# Patient Record
Sex: Female | Born: 1950
Health system: Southern US, Community
[De-identification: ages and names within clinical notes are randomized; demographics above are authoritative.]

## PROBLEM LIST (undated history)

## (undated) DIAGNOSIS — Z803 Family history of malignant neoplasm of breast: Secondary | ICD-10-CM

## (undated) DIAGNOSIS — Z0000 Encounter for general adult medical examination without abnormal findings: Secondary | ICD-10-CM

## (undated) DIAGNOSIS — E785 Hyperlipidemia, unspecified: Secondary | ICD-10-CM

## (undated) DIAGNOSIS — Z87891 Personal history of nicotine dependence: Secondary | ICD-10-CM

## (undated) DIAGNOSIS — Z800 Family history of malignant neoplasm of digestive organs: Secondary | ICD-10-CM

## (undated) HISTORY — DX: Family history of malignant neoplasm of digestive organs: Z80.0

## (undated) HISTORY — DX: Hyperlipidemia, unspecified: E78.5

## (undated) HISTORY — DX: Encounter for general adult medical examination without abnormal findings: Z00.00

## (undated) HISTORY — PX: COLONOSCOPY: SHX174

## (undated) HISTORY — DX: Personal history of nicotine dependence: Z87.891

## (undated) HISTORY — DX: Family history of malignant neoplasm of breast: Z80.3

---
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
## 2004-02-28 ENCOUNTER — Ambulatory Visit: Payer: Self-pay | Admitting: Internal Medicine

## 2005-03-17 ENCOUNTER — Ambulatory Visit: Payer: Self-pay | Admitting: Internal Medicine

## 2006-03-05 ENCOUNTER — Ambulatory Visit: Payer: Self-pay | Admitting: Unknown Physician Specialty

## 2006-03-18 ENCOUNTER — Ambulatory Visit: Payer: Self-pay | Admitting: Internal Medicine

## 2007-03-22 ENCOUNTER — Ambulatory Visit: Payer: Self-pay | Admitting: Internal Medicine

## 2008-04-19 ENCOUNTER — Ambulatory Visit: Payer: Self-pay | Admitting: Internal Medicine

## 2009-04-24 ENCOUNTER — Ambulatory Visit: Payer: Self-pay | Admitting: Internal Medicine

## 2010-04-01 ENCOUNTER — Ambulatory Visit: Payer: Self-pay | Admitting: Internal Medicine

## 2010-04-29 ENCOUNTER — Ambulatory Visit: Payer: Self-pay | Admitting: Internal Medicine

## 2011-03-13 ENCOUNTER — Ambulatory Visit: Payer: Self-pay | Admitting: Unknown Physician Specialty

## 2011-03-16 LAB — PATHOLOGY REPORT

## 2011-04-07 ENCOUNTER — Other Ambulatory Visit (HOSPITAL_COMMUNITY)
Admission: RE | Admit: 2011-04-07 | Discharge: 2011-04-07 | Disposition: A | Discharge status: Home / Self Care | Payer: 59 | Source: Ambulatory Visit | Attending: Internal Medicine | Admitting: Internal Medicine

## 2011-04-07 ENCOUNTER — Ambulatory Visit (INDEPENDENT_AMBULATORY_CARE_PROVIDER_SITE_OTHER): Payer: Self-pay | Admitting: Internal Medicine

## 2011-04-07 ENCOUNTER — Encounter: Payer: Self-pay | Admitting: Internal Medicine

## 2011-04-07 DIAGNOSIS — Z1211 Encounter for screening for malignant neoplasm of colon: Secondary | ICD-10-CM

## 2011-04-07 DIAGNOSIS — Z124 Encounter for screening for malignant neoplasm of cervix: Secondary | ICD-10-CM

## 2011-04-07 DIAGNOSIS — Z23 Encounter for immunization: Secondary | ICD-10-CM

## 2011-04-07 DIAGNOSIS — Z1239 Encounter for other screening for malignant neoplasm of breast: Secondary | ICD-10-CM

## 2011-04-07 DIAGNOSIS — Z7189 Other specified counseling: Secondary | ICD-10-CM

## 2011-04-07 DIAGNOSIS — Z01419 Encounter for gynecological examination (general) (routine) without abnormal findings: Secondary | ICD-10-CM | POA: Insufficient documentation

## 2011-04-07 DIAGNOSIS — Z716 Tobacco abuse counseling: Secondary | ICD-10-CM | POA: Insufficient documentation

## 2011-04-07 DIAGNOSIS — H6120 Impacted cerumen, unspecified ear: Secondary | ICD-10-CM

## 2011-04-07 DIAGNOSIS — E785 Hyperlipidemia, unspecified: Secondary | ICD-10-CM

## 2011-04-07 DIAGNOSIS — F17200 Nicotine dependence, unspecified, uncomplicated: Secondary | ICD-10-CM

## 2011-04-07 DIAGNOSIS — R5383 Other fatigue: Secondary | ICD-10-CM

## 2011-04-07 DIAGNOSIS — N760 Acute vaginitis: Secondary | ICD-10-CM | POA: Insufficient documentation

## 2011-04-07 DIAGNOSIS — J320 Chronic maxillary sinusitis: Secondary | ICD-10-CM

## 2011-04-07 DIAGNOSIS — Z720 Tobacco use: Secondary | ICD-10-CM

## 2011-04-07 DIAGNOSIS — Z1159 Encounter for screening for other viral diseases: Secondary | ICD-10-CM | POA: Insufficient documentation

## 2011-04-07 LAB — CBC WITH DIFFERENTIAL/PLATELET
Basophils Absolute: 0 10*3/uL (ref 0.0–0.1)
Eosinophils Absolute: 0 10*3/uL (ref 0.0–0.7)
Lymphocytes Relative: 32.1 % (ref 12.0–46.0)
MCHC: 33.7 g/dL (ref 30.0–36.0)
Monocytes Absolute: 0.5 10*3/uL (ref 0.1–1.0)
Neutrophils Relative %: 61.9 % (ref 43.0–77.0)
Platelets: 338 10*3/uL (ref 150.0–400.0)
RBC: 4.7 Mil/uL (ref 3.87–5.11)
RDW: 14.7 % — ABNORMAL HIGH (ref 11.5–14.6)

## 2011-04-07 LAB — LIPID PANEL: VLDL: 18 mg/dL (ref 0.0–40.0)

## 2011-04-07 LAB — COMPREHENSIVE METABOLIC PANEL
ALT: 41 U/L — ABNORMAL HIGH (ref 0–35)
AST: 38 U/L — ABNORMAL HIGH (ref 0–37)
Albumin: 4.7 g/dL (ref 3.5–5.2)
BUN: 19 mg/dL (ref 6–23)
Calcium: 10.1 mg/dL (ref 8.4–10.5)
Chloride: 109 mEq/L (ref 96–112)
Potassium: 4.4 mEq/L (ref 3.5–5.1)
Sodium: 144 mEq/L (ref 135–145)
Total Protein: 7.8 g/dL (ref 6.0–8.3)

## 2011-04-07 LAB — TSH: TSH: 0.76 u[IU]/mL (ref 0.35–5.50)

## 2011-04-07 LAB — LDL CHOLESTEROL, DIRECT: Direct LDL: 153.5 mg/dL

## 2011-04-07 MED ORDER — PREDNISONE (PAK) 10 MG PO TABS: ORAL_TABLET | ORAL | Status: AC

## 2011-04-07 MED ORDER — AMOXICILLIN-POT CLAVULANATE 875-125 MG PO TABS: 1.0000 | ORAL_TABLET | Freq: Two times a day (BID) | ORAL | Status: AC

## 2011-04-07 NOTE — Progress Notes (Signed)
Subjective:    Patient ID: Kimberly Schmitt, female    DOB: 31-May-1950, 60 y.o.   MRN: [id number]  HPI Kimberly Schmitt is a 60 yo healthy white female who presets for transition of care from [facility].  exams.  Last PAP 2010.  Last mammogram Nov 2011 normal.  Last colonoscopy Nov 2012 hyperpastic polups seen so repeat due in 2017.   Review of Systems     Objective:   Physical Exam        Assessment & Plan:  Abnormal l breast exam sinsusitis chronic.  2 weesk of augmentin plus prednisone taper    Ears  : devrox return for ear cleaning  Subjective:     Kimberly Schmitt is a 60 y.o. female   and is here for a comprehensive physical exam. Her last exam was one year ago with her former PCP Aram Beecham for her annual physical The patient reports no problems.  History   Social History  . Marital Status: Married    Spouse Name: N/A    Number of Children: N/A  . Years of Education: N/A   Occupational History  . Not on file.   Social History Main Topics  . Smoking status: Current Everyday Smoker -- 0.5 packs/day    Types: Cigarettes  . Smokeless tobacco: Never Used  . Alcohol Use: No  . Drug Use: No  . Sexually Active: Not on file   Other Topics Concern  . Not on file   Social History Narrative  . No narrative on file   No health maintenance topics applied.  The following portions of the patient's history were reviewed and updated as appropriate: allergies, current medications, past family history, past medical history, past social history, past surgical history and problem list.  Review of Systems A comprehensive review of systems was negative.   Objective:    BP 118/68  Pulse 10  Temp(Src) 98.2 F (36.8 C) (Oral)  Resp 14  Ht 4' 11.5" (1.511 m)  Wt 117 lb 12 oz (53.411 kg)  BMI 23.38 kg/m2  SpO2 98% General appearance: alert, cooperative and appears stated age Head: Normocephalic, without obvious abnormality, atraumatic Ears: normal TM's and external  ear canals both ears Nose: Nares normal. Septum midline. Mucosa normal. No drainage or sinus tenderness. Throat: lips, mucosa, and tongue normal; teeth and gums normal Neck: no adenopathy, no carotid bruit, no JVD, supple, symmetrical, trachea midline and thyroid not enlarged, symmetric, no tenderness/mass/nodules Back: symmetric, no curvature. ROM normal. No CVA tenderness. Lungs: clear to auscultation bilaterally Breasts: normal appearance, no masses or tenderness Heart: regular rate and rhythm, S1, S2 normal, no murmur, click, rub or gallop Abdomen: soft, non-tender; bowel sounds normal; no masses,  no organomegaly Pelvic: cervix normal in appearance, external genitalia normal, no adnexal masses or tenderness, no cervical motion tenderness, rectovaginal septum normal, uterus normal size, shape, and consistency and vagina normal without discharge Extremities: extremities normal, atraumatic, no cyanosis or edema Pulses: 2+ and symmetric Skin: Skin color, texture, turgor normal. No rashes or lesions Lymph nodes: Cervical, supraclavicular, and axillary nodes normal. Neurologic: Alert and oriented X 3, normal strength and tone. Normal symmetric reflexes. Normal coordination and gait    Assessment:    Healthy female exam.    Sinusitis.   Plan:    Patient will be sent for mamogram PAP was done today.  Tobacco abuse was addressed and counseling given.   Fasting labs ordered   Augmentin and prednisone for persistent sinusitis symptoms.  See After Visit Summary for Counseling Recommendations

## 2011-04-07 NOTE — Patient Instructions (Signed)
I am prescribing augmentin and prednisone for your sinus symptoms .    Please use Debrox oil to soften the ear wax in both ears and return next week (make appt) for an ear cleaning  A mammogram will be ordered today

## 2011-04-07 NOTE — Assessment & Plan Note (Addendum)
She was advised of the risks inherent in tobacco use, including increasd risks of cancer, osteoporosis, atherosclerosis and emphysema and  is interested in a gradual reduction approach.   Suggested reducing use by 1 cigarette weekly , currently using 10/day

## 2011-04-08 ENCOUNTER — Encounter: Payer: Self-pay | Admitting: Internal Medicine

## 2011-04-08 DIAGNOSIS — Z124 Encounter for screening for malignant neoplasm of cervix: Secondary | ICD-10-CM | POA: Insufficient documentation

## 2011-04-08 DIAGNOSIS — H6120 Impacted cerumen, unspecified ear: Secondary | ICD-10-CM | POA: Insufficient documentation

## 2011-04-08 DIAGNOSIS — Z1211 Encounter for screening for malignant neoplasm of colon: Secondary | ICD-10-CM | POA: Insufficient documentation

## 2011-04-08 DIAGNOSIS — Z0001 Encounter for general adult medical examination with abnormal findings: Secondary | ICD-10-CM | POA: Insufficient documentation

## 2011-04-08 NOTE — Assessment & Plan Note (Signed)
No prior abnormal mammograms.  Last one Nov 2011.   Repeat ordered today.

## 2011-04-08 NOTE — Assessment & Plan Note (Signed)
Normal PAP Nov 2010.  Repeat done today

## 2011-04-15 ENCOUNTER — Encounter: Payer: Self-pay | Admitting: Internal Medicine

## 2011-04-16 ENCOUNTER — Ambulatory Visit: Payer: 59 | Admitting: *Deleted

## 2011-04-16 NOTE — Progress Notes (Signed)
  Subjective:    Patient ID: Kimberly Schmitt, female    DOB: 09-13-1950, 60 y.o.   MRN: [id number]  HPI    Review of Systems     Objective:   Physical Exam        Assessment & Plan:

## 2011-04-17 ENCOUNTER — Encounter: Payer: Self-pay | Admitting: Internal Medicine

## 2011-04-23 ENCOUNTER — Other Ambulatory Visit (INDEPENDENT_AMBULATORY_CARE_PROVIDER_SITE_OTHER): Payer: 59 | Admitting: *Deleted

## 2011-04-23 DIAGNOSIS — R7989 Other specified abnormal findings of blood chemistry: Secondary | ICD-10-CM

## 2011-04-23 LAB — HEPATIC FUNCTION PANEL
ALT: 46 U/L — ABNORMAL HIGH (ref 0–35)
AST: 46 U/L — ABNORMAL HIGH (ref 0–37)
Alkaline Phosphatase: 50 U/L (ref 39–117)
Total Bilirubin: 0.4 mg/dL (ref 0.3–1.2)

## 2011-05-04 ENCOUNTER — Ambulatory Visit: Payer: Self-pay | Admitting: Internal Medicine

## 2011-05-11 ENCOUNTER — Telehealth: Payer: Self-pay | Admitting: Internal Medicine

## 2011-05-11 NOTE — Telephone Encounter (Signed)
[phone] Pt calling to check on ultra sound she had done 05/04/11 @ [facility]

## 2011-05-19 NOTE — Telephone Encounter (Signed)
Have you seen the ultrasound that patient had done.

## 2011-05-19 NOTE — Telephone Encounter (Signed)
No. I have not seen it.

## 2011-05-21 ENCOUNTER — Other Ambulatory Visit (INDEPENDENT_AMBULATORY_CARE_PROVIDER_SITE_OTHER): Payer: 59 | Admitting: *Deleted

## 2011-05-21 DIAGNOSIS — R7989 Other specified abnormal findings of blood chemistry: Secondary | ICD-10-CM

## 2011-05-21 LAB — HEPATIC FUNCTION PANEL
ALT: 21 U/L (ref 0–35)
AST: 20 U/L (ref 0–37)
Albumin: 4.4 g/dL (ref 3.5–5.2)
Total Bilirubin: 0.4 mg/dL (ref 0.3–1.2)

## 2011-05-22 LAB — HEPATITIS PANEL, ACUTE
Hep B C IgM: NEGATIVE
Hepatitis B Surface Ag: NEGATIVE

## 2011-05-29 ENCOUNTER — Ambulatory Visit (INDEPENDENT_AMBULATORY_CARE_PROVIDER_SITE_OTHER): Payer: 59 | Admitting: Internal Medicine

## 2011-05-29 ENCOUNTER — Encounter: Payer: Self-pay | Admitting: Internal Medicine

## 2011-05-29 VITALS — BP 142/70 | HR 80 | Temp 97.9°F | Wt 120.0 lb

## 2011-05-29 DIAGNOSIS — H81399 Other peripheral vertigo, unspecified ear: Secondary | ICD-10-CM

## 2011-05-29 MED ORDER — PROMETHAZINE HCL 25 MG PO TABS: 25.0000 mg | ORAL_TABLET | Freq: Three times a day (TID) | ORAL | Status: AC | PRN

## 2011-05-29 MED ORDER — OSELTAMIVIR PHOSPHATE 75 MG PO CAPS: 75.0000 mg | ORAL_CAPSULE | Freq: Two times a day (BID) | ORAL | Status: AC

## 2011-05-29 MED ORDER — PREDNISONE (PAK) 10 MG PO TABS: ORAL_TABLET | ORAL | Status: AC

## 2011-05-29 NOTE — Patient Instructions (Signed)
Your vertigo may be the start of the fl or a viral infection.   Do not start the tamiflu unless you develop high fevers,  Intense  muscle aches,  And cough and sinus     For right now I am going to treat you for labyrynthitis (inflammation of the inner ear ) with prednisone ,  An anthistamine (Zyrtec) and an anti nausea medication  Take the prednisone taper as before  Take zytec daily at bedtime (available generically as cetirizine  10 mg )  Use the promethazine for nausea,  1/2 tablet every 6 to 8 hours.

## 2011-05-31 ENCOUNTER — Encounter: Payer: Self-pay | Admitting: Internal Medicine

## 2011-05-31 DIAGNOSIS — H81399 Other peripheral vertigo, unspecified ear: Secondary | ICD-10-CM | POA: Insufficient documentation

## 2011-05-31 NOTE — Assessment & Plan Note (Addendum)
Sudden onset, with normal neurologic exam.  Will treat for labrynthitis and give rx for Tamiflu to start if she develops fevers, myalgias.  Prednisone, zyrtec.

## 2011-05-31 NOTE — Progress Notes (Signed)
  Subjective:    Patient ID: Kimberly Schmitt, female    DOB: 05/14/1950, 61 y.o.   MRN: [id number]  HPI  Kimberly Schmitt is a healthy 61 yr old white female who presents with sudden onset of vertigo with  nausea which started today.  She denies fevers, myalgias, back pain and urinary frequency. Her husband was sick two weeks ago with a viral infection.    Past Medical History  Diagnosis Date  . Hyperlipidemia     220 per patient   Current Outpatient Prescriptions on File Prior to Visit  Medication Sig Dispense Refill  . fish oil-omega-3 fatty acids 1000 MG capsule Take 2 g by mouth daily.          Review of Systems  Constitutional: Negative for fever, chills and unexpected weight change.  HENT: Negative for hearing loss, ear pain, nosebleeds, congestion, sore throat, facial swelling, rhinorrhea, sneezing, mouth sores, trouble swallowing, neck pain, neck stiffness, voice change, postnasal drip, sinus pressure, tinnitus and ear discharge.   Eyes: Negative for pain, discharge, redness and visual disturbance.  Respiratory: Negative for cough, chest tightness, shortness of breath, wheezing and stridor.   Cardiovascular: Negative for chest pain, palpitations and leg swelling.  Gastrointestinal: Positive for nausea.  Musculoskeletal: Negative for myalgias and arthralgias.  Skin: Negative for color change and rash.  Neurological: Positive for dizziness. Negative for weakness, light-headedness and headaches.  Hematological: Negative for adenopathy.   BP 142/70  Pulse 80  Temp(Src) 97.9 F (36.6 C) (Oral)  Wt 120 lb (54.432 kg)  SpO2 99%      Objective:   Physical Exam  Constitutional: She is oriented to person, place, and time. She appears well-developed and well-nourished.  HENT:  Mouth/Throat: Oropharynx is clear and moist.  Eyes: EOM are normal. Pupils are equal, round, and reactive to light. No scleral icterus.  Neck: Normal range of motion. Neck supple. No JVD present. No thyromegaly  present.  Cardiovascular: Normal rate, regular rhythm, normal heart sounds and intact distal pulses.   Pulmonary/Chest: Effort normal and breath sounds normal.  Abdominal: Soft. Bowel sounds are normal. She exhibits no mass. There is no tenderness.  Musculoskeletal: Normal range of motion. She exhibits no edema.  Lymphadenopathy:    She has no cervical adenopathy.  Neurological: She is alert and oriented to person, place, and time. She displays normal reflexes. No cranial nerve deficit. Coordination normal.  Skin: Skin is warm and dry.  Psychiatric: She has a normal mood and affect.      Assessment & Plan:   Vertigo, peripheral Sudden onset, with normal neurologic exam.  Will treat for labrynthitis and give rx for Tamiflu to start if she develops fevers, myalgias.  Prednisone, zyrtec.     Updated Medication List Outpatient Encounter Prescriptions as of 05/29/2011  Medication Sig Dispense Refill  . fish oil-omega-3 fatty acids 1000 MG capsule Take 2 g by mouth daily.        Marland Kitchen oseltamivir (TAMIFLU) 75 MG capsule Take 1 capsule (75 mg total) by mouth 2 (two) times daily.  10 capsule  0  . predniSONE (STERAPRED UNI-PAK) 10 MG tablet 6 tablets on Day 1 , then reduce by 1 tablet daily until gone  21 tablet  0  . promethazine (PHENERGAN) 25 MG tablet Take 1 tablet (25 mg total) by mouth every 8 (eight) hours as needed for nausea.  20 tablet  0

## 2012-04-11 ENCOUNTER — Encounter: Payer: Self-pay | Admitting: Internal Medicine

## 2012-04-11 ENCOUNTER — Ambulatory Visit (INDEPENDENT_AMBULATORY_CARE_PROVIDER_SITE_OTHER): Payer: 59 | Admitting: Internal Medicine

## 2012-04-11 VITALS — BP 110/62 | HR 89 | Temp 97.7°F | Resp 12 | Ht 59.0 in | Wt 124.0 lb

## 2012-04-11 DIAGNOSIS — E785 Hyperlipidemia, unspecified: Secondary | ICD-10-CM

## 2012-04-11 DIAGNOSIS — Z1239 Encounter for other screening for malignant neoplasm of breast: Secondary | ICD-10-CM

## 2012-04-11 DIAGNOSIS — Z1211 Encounter for screening for malignant neoplasm of colon: Secondary | ICD-10-CM

## 2012-04-11 DIAGNOSIS — R5383 Other fatigue: Secondary | ICD-10-CM

## 2012-04-11 DIAGNOSIS — R350 Frequency of micturition: Secondary | ICD-10-CM

## 2012-04-11 DIAGNOSIS — R5381 Other malaise: Secondary | ICD-10-CM

## 2012-04-11 DIAGNOSIS — IMO0001 Reserved for inherently not codable concepts without codable children: Secondary | ICD-10-CM

## 2012-04-11 DIAGNOSIS — F17200 Nicotine dependence, unspecified, uncomplicated: Secondary | ICD-10-CM

## 2012-04-11 DIAGNOSIS — Z124 Encounter for screening for malignant neoplasm of cervix: Secondary | ICD-10-CM

## 2012-04-11 DIAGNOSIS — Z1331 Encounter for screening for depression: Secondary | ICD-10-CM

## 2012-04-11 DIAGNOSIS — Z716 Tobacco abuse counseling: Secondary | ICD-10-CM

## 2012-04-11 DIAGNOSIS — Z23 Encounter for immunization: Secondary | ICD-10-CM

## 2012-04-11 DIAGNOSIS — Z7189 Other specified counseling: Secondary | ICD-10-CM

## 2012-04-11 LAB — COMPLETE METABOLIC PANEL WITH GFR
ALT: 13 U/L (ref 0–35)
AST: 18 U/L (ref 0–37)
Alkaline Phosphatase: 54 U/L (ref 39–117)
Creat: 0.71 mg/dL (ref 0.50–1.10)
GFR, Est African American: >89 mL/min
Sodium: 144 mEq/L (ref 135–145)
Total Bilirubin: 0.4 mg/dL (ref 0.3–1.2)

## 2012-04-11 LAB — URINALYSIS
Hgb urine dipstick: NEGATIVE
Nitrite: NEGATIVE
Specific Gravity, Urine: <=1.005 (ref 1.000–1.030)
Total Protein, Urine: NEGATIVE
pH: 6 (ref 5.0–8.0)

## 2012-04-11 LAB — LIPID PANEL
Cholesterol: 201 mg/dL — ABNORMAL HIGH (ref 0–200)
HDL: 62.3 mg/dL (ref >39.00)
Total CHOL/HDL Ratio: 3
Triglycerides: 83 mg/dL (ref 0.0–149.0)
VLDL: 16.6 mg/dL (ref 0.0–40.0)

## 2012-04-11 LAB — CBC WITH DIFFERENTIAL/PLATELET
Eosinophils Relative: 0.6 % (ref 0.0–5.0)
HCT: 42.5 % (ref 36.0–46.0)
Lymphs Abs: 2.3 10*3/uL (ref 0.7–4.0)
Monocytes Relative: 6.4 % (ref 3.0–12.0)
Platelets: 296 10*3/uL (ref 150.0–400.0)
RBC: 4.57 Mil/uL (ref 3.87–5.11)
WBC: 8.2 10*3/uL (ref 4.5–10.5)

## 2012-04-11 LAB — LDL CHOLESTEROL, DIRECT: Direct LDL: 125.5 mg/dL

## 2012-04-11 NOTE — Assessment & Plan Note (Signed)
Normal PAP smear Nov 2012 and normal HPV>  Repeat in 3 to  5 years per current guidelines.

## 2012-04-11 NOTE — Assessment & Plan Note (Signed)
She had hyperplastic polyps on Nov 2012 colonoscopy and is due for repeat in 2017

## 2012-04-11 NOTE — Progress Notes (Signed)
Patient ID: Kimberly Schmitt, female   DOB: 08-06-1950, 61 y.o.   MRN: [id number] Pap normal last year.  Eye exam normal except for prescription change.    Still smoking 1/2 pack daily.  Would like to quit .  Not on HRT for over one year.,  No hot flashes.   Had the flu vaccine.  Subjective:     Kimberly Schmitt is a 61 y.o. female and is here for a comprehensive physical exam. The patient reports no problems.  History   Social History  . Marital Status: Married    Spouse Name: N/A    Number of Children: N/A  . Years of Education: N/A   Occupational History  . Not on file.   Social History Main Topics  . Smoking status: Current Every Day Smoker -- 0.5 packs/day    Types: Cigarettes  . Smokeless tobacco: Never Used  . Alcohol Use: No  . Drug Use: No  . Sexually Active: Not on file   Other Topics Concern  . Not on file   Social History Narrative  . No narrative on file   Health Maintenance  Topic Date Due  . Zostavax  01/23/2009  . Influenza Vaccine  01/10/2012  . Mammogram  05/03/2013  . Pap Smear  04/06/2014  . Colonoscopy  03/12/2021  . Tetanus/tdap  04/06/2021    The following portions of the patient's history were reviewed and updated as appropriate: allergies, current medications, past family history, past medical history, past social history, past surgical history and problem list.  Review of Systems A comprehensive review of systems was negative.   Objective:   BP 110/62  Pulse 89  Temp 97.7 F (36.5 C) (Oral)  Resp 12  Ht 4\' 11"  (1.499 m)  Wt 124 lb (56.246 kg)  BMI 25.04 kg/m2  SpO2 95%  General Appearance:    Alert, cooperative, no distress, appears stated age  Head:    Normocephalic, without obvious abnormality, atraumatic  Eyes:    PERRL, conjunctiva/corneas clear, EOM's intact, fundi    benign, both eyes  Ears:    Normal TM's and external ear canals, both ears  Nose:   Nares normal, septum midline, mucosa normal, no drainage    or sinus tenderness   Throat:   Lips, mucosa, and tongue normal; teeth and gums normal  Neck:   Supple, symmetrical, trachea midline, no adenopathy;    thyroid:  no enlargement/tenderness/nodules; no carotid   bruit or JVD  Back:     Symmetric, no curvature, ROM normal, no CVA tenderness  Lungs:     Clear to auscultation bilaterally, respirations unlabored  Chest Wall:    No tenderness or deformity   Heart:    Regular rate and rhythm, S1 and S2 normal, no murmur, rub   or gallop  Breast Exam:    No tenderness, masses, or nipple abnormality  Abdomen:     Soft, non-tender, bowel sounds active all four quadrants,    no masses, no organomegaly  Extremities:   Extremities normal, atraumatic, no cyanosis or edema  Pulses:   2+ and symmetric all extremities  Skin:   Skin color, texture, turgor normal, no rashes or lesions  Lymph nodes:   Cervical, supraclavicular, and axillary nodes normal  Neurologic:   CNII-XII intact, normal strength, sensation and reflexes    throughout        Assessment:   Screening for colon cancer She had hyperplastic polyps on Nov 2012 colonoscopy and is due for  repeat in 2017  Tobacco abuse counseling Spent 3 minutes discussing risk of continued tobacco abuse, including but not limited to CAD, PAD, hypertension, and CA.  She is not interested in pharmacotherapy at this time.  Screening for cervical cancer Normal PAP smear Nov 2012 and normal HPV>  Repeat in 3 to  5 years per current guidelines.   Screening for breast cancer Breast exam notable for dense breasts. Annual mammogram is due,  Scheduled at [facility].   Updated Medication List Outpatient Encounter Prescriptions as of 04/11/2012  Medication Sig Dispense Refill  . fish oil-omega-3 fatty acids 1000 MG capsule Take 2 g by mouth daily.

## 2012-04-11 NOTE — Patient Instructions (Addendum)
You had yor TDaP vaccine last year!  You received your pneumovax today  We will schedule your mammogram in January at [facility]

## 2012-04-11 NOTE — Assessment & Plan Note (Signed)
Breast exam notable for dense breasts. Annual mammogram is due,  Scheduled at [facility].

## 2012-04-11 NOTE — Assessment & Plan Note (Signed)
Spent 3 minutes discussing risk of continued tobacco abuse, including but not limited to CAD, PAD, hypertension, and CA.  She is not interested in pharmacotherapy at this time. 

## 2012-05-06 ENCOUNTER — Telehealth: Payer: Self-pay | Admitting: Internal Medicine

## 2012-05-06 NOTE — Telephone Encounter (Signed)
Update, mammogram was compared to previous mammograms and found to be unchanged. BIRADS2, recommended follow up mammogram in 1 year.

## 2012-05-06 NOTE — Telephone Encounter (Signed)
Pt husband notified 

## 2012-05-06 NOTE — Telephone Encounter (Signed)
Mammogram was incomplete.  Radiologist recommended that they obtain former studies for comparison.  Has pt been contacted about this?

## 2012-05-19 ENCOUNTER — Encounter: Payer: Self-pay | Admitting: Internal Medicine

## 2012-10-21 ENCOUNTER — Encounter: Payer: Self-pay | Admitting: Adult Health

## 2012-10-21 ENCOUNTER — Ambulatory Visit (INDEPENDENT_AMBULATORY_CARE_PROVIDER_SITE_OTHER): Payer: BC Managed Care – PPO | Admitting: Adult Health

## 2012-10-21 VITALS — BP 138/70 | HR 84 | Temp 98.2°F | Resp 12 | Wt 123.0 lb

## 2012-10-21 DIAGNOSIS — I776 Arteritis, unspecified: Secondary | ICD-10-CM

## 2012-10-21 DIAGNOSIS — R21 Rash and other nonspecific skin eruption: Secondary | ICD-10-CM

## 2012-10-21 LAB — URINALYSIS, ROUTINE W REFLEX MICROSCOPIC
Leukocytes, UA: NEGATIVE
Nitrite: NEGATIVE
Specific Gravity, Urine: 1.015 (ref 1.000–1.030)
Total Protein, Urine: NEGATIVE
pH: 6 (ref 5.0–8.0)

## 2012-10-21 LAB — BASIC METABOLIC PANEL
BUN: 10 mg/dL (ref 6–23)
Calcium: 9.9 mg/dL (ref 8.4–10.5)
GFR: 111.33 mL/min (ref >60.00)
Glucose, Bld: 75 mg/dL (ref 70–99)
Sodium: 141 mEq/L (ref 135–145)

## 2012-10-21 MED ORDER — TRIAMCINOLONE ACETONIDE 0.1 % EX LOTN: TOPICAL_LOTION | Freq: Three times a day (TID) | CUTANEOUS | Status: DC

## 2012-10-21 NOTE — Progress Notes (Signed)
  Subjective:    Patient ID: Kimberly Schmitt, female    DOB: 11/21/1950, 62 y.o.   MRN: [id number]  HPI  Patient is a pleasant 62 year old female who presents to clinic with bilateral lower extremity rash. The rash begins on her feet and moves up towards the mid calf. There is no itching. She denies pain; however, she feels "pins and needles". Patient denies using new soaps, lotions or detergents. She walks daily. Patient has no edema. She denies fever or chills, general malaise. She also denies a recent viral or bacterial infection. Overall, patient feels well.   Current Outpatient Prescriptions on File Prior to Visit  Medication Sig Dispense Refill  . fish oil-omega-3 fatty acids 1000 MG capsule Take 2 g by mouth daily.         No current facility-administered medications on file prior to visit.     Review of Systems  Constitutional: Negative for fever and chills.  Respiratory: Negative.   Cardiovascular: Negative.  Negative for leg swelling.  Musculoskeletal: Negative.   Skin: Positive for rash.       Sloughing of skin bilateral feet and mid lower extremities  Psychiatric/Behavioral: Negative.        Objective:   Physical Exam  Constitutional: She is oriented to person, place, and time. She appears well-developed and well-nourished. No distress.  Cardiovascular: Normal rate and intact distal pulses.   Pulmonary/Chest: Effort normal. No respiratory distress.  Neurological: She is alert and oriented to person, place, and time.  Skin: Skin is warm.  Sloughing of skin bilateral feet and mid lower extremity. Rash posterior aspect of bilateral middle lower extremity.  Psychiatric: She has a normal mood and affect. Her behavior is normal. Thought content normal.          Assessment & Plan:

## 2012-10-21 NOTE — Patient Instructions (Addendum)
  Please have your labs drawn prior to leaving the office.  Apply triamcinolone lotion 3-4 times a day to the affected area.  Avoid perfumed soaps and lotions for the time being.  I am referring you to dermatology for further evaluation of rash

## 2012-10-21 NOTE — Assessment & Plan Note (Signed)
Dermatitis versus vasculitis. Check labs: Sedimentation rate, CRP, ANCA, metabolic panel and urinalysis. Triamcinolone 0.1% lotion to bilateral lower extremities 3-4 times a day. Refer to dermatology.

## 2012-10-24 ENCOUNTER — Telehealth: Payer: Self-pay | Admitting: *Deleted

## 2012-10-24 LAB — ANCA SCREEN W REFLEX TITER: Atypical p-ANCA Screen: NEGATIVE

## 2012-10-24 LAB — C-REACTIVE PROTEIN: CRP: <0.5 mg/dL (ref 0.5–20.0)

## 2012-10-24 NOTE — Telephone Encounter (Signed)
Pt called requesting lab results. All were normal, anything else I need to tell her? I see referral to dermatology in process.

## 2012-10-25 NOTE — Telephone Encounter (Signed)
Reported, see result note.

## 2013-01-24 ENCOUNTER — Telehealth: Payer: Self-pay | Admitting: *Deleted

## 2013-01-24 ENCOUNTER — Encounter: Payer: Self-pay | Admitting: Adult Health

## 2013-01-24 ENCOUNTER — Ambulatory Visit (INDEPENDENT_AMBULATORY_CARE_PROVIDER_SITE_OTHER): Payer: BC Managed Care – PPO | Admitting: Adult Health

## 2013-01-24 ENCOUNTER — Ambulatory Visit: Payer: Self-pay | Admitting: Adult Health

## 2013-01-24 VITALS — BP 112/80 | HR 90 | Temp 98.2°F | Resp 12 | Ht 59.0 in | Wt 118.0 lb

## 2013-01-24 DIAGNOSIS — R1011 Right upper quadrant pain: Secondary | ICD-10-CM

## 2013-01-24 DIAGNOSIS — R112 Nausea with vomiting, unspecified: Secondary | ICD-10-CM

## 2013-01-24 LAB — CBC WITH DIFFERENTIAL/PLATELET
Basophils Absolute: 0.1 10*3/uL (ref 0.0–0.1)
Eosinophils Absolute: 0.1 10*3/uL (ref 0.0–0.7)
HCT: 42.3 % (ref 36.0–46.0)
Hemoglobin: 14.3 g/dL (ref 12.0–15.0)
Lymphs Abs: 2.3 10*3/uL (ref 0.7–4.0)
MCHC: 33.7 g/dL (ref 30.0–36.0)
MCV: 90.8 fl (ref 78.0–100.0)
Monocytes Absolute: 0.5 10*3/uL (ref 0.1–1.0)
Monocytes Relative: 6.5 % (ref 3.0–12.0)
Neutro Abs: 5.2 10*3/uL (ref 1.4–7.7)
RDW: 14.2 % (ref 11.5–14.6)

## 2013-01-24 LAB — BASIC METABOLIC PANEL
CO2: 28 mEq/L (ref 19–32)
Calcium: 9.5 mg/dL (ref 8.4–10.5)
Sodium: 140 mEq/L (ref 135–145)

## 2013-01-24 LAB — HEPATIC FUNCTION PANEL
AST: 22 U/L (ref 0–37)
Albumin: 4.5 g/dL (ref 3.5–5.2)

## 2013-01-24 NOTE — Telephone Encounter (Signed)
Called and left message notifying pt that ultrasound of abdomen showed no evidence of gallbladder disease, ultrasound was normal.

## 2013-01-24 NOTE — Assessment & Plan Note (Addendum)
Patient is under considerable emotional stress given terminal diagnosis of her father. She is exhibiting RUQ tenderness upon palpation. Send for ultrasound to evaluate for gallstones. Check cbc, hepatic panel and bmet. Start Zantac 150 mg bid and Omeprazole 20 mg daily. Will try this for 2 weeks. If no improvement consider GI referral. Note, spend greater than 25 min in face to face communication mainly in emotional support and allowing patient to voice concerns over her father's terminal illness and in the implementation of care pertaining to her physical problem.

## 2013-01-24 NOTE — Patient Instructions (Addendum)
  Please have your labs drawn prior to leaving the office.  Once the results are available we will contact you.  Start Zantac 150 mg twice a day. Take this with breakfast and then at bedtime.  Take omeprazole (Prilosec) 20 mg first thing in the morning before drinking or eating anything else.  Make sure to eat something every 2-3 hours.  I am sending you for an abdominal ultrasound

## 2013-01-24 NOTE — Progress Notes (Signed)
  Subjective:    Patient ID: Kimberly Schmitt, female    DOB: 10/16/1950, 62 y.o.   MRN: [id number]  HPI  Patient presents with c/o stomach hurting since Sunday. She vomited fluids on Sunday evening; although, later she was able to tolerate a small meal.  She still felt nauseated yesterday and the epigastric discomfort persists. She is also having RUQ discomfort which she reports radiates to her back. Currently, she is under a lot of stress 2/2 her father's diagnosis of terminal cancer. She reports he is under hospice care and has a poor prognosis. She denies any other symptoms. She has not taken any OTC medications. She has been taking a probiotic for several months.    Review of Systems  Constitutional: Negative.   Respiratory: Negative.   Cardiovascular: Negative.   Gastrointestinal: Positive for nausea, vomiting and abdominal pain.       RUQ discomfort which radiates to her back  Genitourinary: Negative.       BP 112/80  Pulse 90  Temp(Src) 98.2 F (36.8 C) (Oral)  Resp 12  Ht 4\' 11"  (1.499 m)  Wt 118 lb (53.524 kg)  BMI 23.82 kg/m2  SpO2 98%    Objective:   Physical Exam  Constitutional: She is oriented to person, place, and time. She appears well-developed and well-nourished. No distress.  HENT:  Head: Normocephalic and atraumatic.  Cardiovascular: Normal rate, regular rhythm and normal heart sounds.  Exam reveals no gallop and no friction rub.   No murmur heard. Pulmonary/Chest: Effort normal and breath sounds normal. No respiratory distress. She has no wheezes. She has no rales.  Abdominal: Soft. Bowel sounds are normal. She exhibits no distension and no mass. There is tenderness. There is no rebound and no guarding.  Neurological: She is alert and oriented to person, place, and time. She has normal reflexes.  Skin: Skin is warm and dry.  Psychiatric: Her behavior is normal. Judgment and thought content normal.  Tearful in discussing her father's terminal illness           Assessment & Plan:

## 2013-02-03 ENCOUNTER — Encounter: Payer: Self-pay | Admitting: Adult Health

## 2013-02-03 ENCOUNTER — Ambulatory Visit: Payer: BC Managed Care – PPO | Admitting: Internal Medicine

## 2013-04-17 ENCOUNTER — Encounter: Payer: Self-pay | Admitting: Internal Medicine

## 2013-04-17 ENCOUNTER — Ambulatory Visit (INDEPENDENT_AMBULATORY_CARE_PROVIDER_SITE_OTHER): Payer: BC Managed Care – PPO | Admitting: Internal Medicine

## 2013-04-17 ENCOUNTER — Encounter (INDEPENDENT_AMBULATORY_CARE_PROVIDER_SITE_OTHER): Payer: Self-pay

## 2013-04-17 VITALS — BP 128/74 | HR 84 | Temp 97.5°F | Resp 12 | Ht 60.25 in | Wt 114.8 lb

## 2013-04-17 DIAGNOSIS — Z716 Tobacco abuse counseling: Secondary | ICD-10-CM

## 2013-04-17 DIAGNOSIS — E785 Hyperlipidemia, unspecified: Secondary | ICD-10-CM

## 2013-04-17 DIAGNOSIS — Z7189 Other specified counseling: Secondary | ICD-10-CM

## 2013-04-17 DIAGNOSIS — M899 Disorder of bone, unspecified: Secondary | ICD-10-CM

## 2013-04-17 DIAGNOSIS — M810 Age-related osteoporosis without current pathological fracture: Secondary | ICD-10-CM

## 2013-04-17 DIAGNOSIS — Z1239 Encounter for other screening for malignant neoplasm of breast: Secondary | ICD-10-CM

## 2013-04-17 DIAGNOSIS — M8580 Other specified disorders of bone density and structure, unspecified site: Secondary | ICD-10-CM

## 2013-04-17 DIAGNOSIS — Z0000 Encounter for general adult medical examination without abnormal findings: Secondary | ICD-10-CM

## 2013-04-17 DIAGNOSIS — E559 Vitamin D deficiency, unspecified: Secondary | ICD-10-CM

## 2013-04-17 DIAGNOSIS — R5381 Other malaise: Secondary | ICD-10-CM

## 2013-04-17 DIAGNOSIS — F17200 Nicotine dependence, unspecified, uncomplicated: Secondary | ICD-10-CM

## 2013-04-17 HISTORY — DX: Encounter for general adult medical examination without abnormal findings: Z00.00

## 2013-04-17 LAB — COMPREHENSIVE METABOLIC PANEL
ALT: 18 U/L (ref 0–35)
AST: 21 U/L (ref 0–37)
Albumin: 4.8 g/dL (ref 3.5–5.2)
BUN: 13 mg/dL (ref 6–23)
Calcium: 10 mg/dL (ref 8.4–10.5)
Chloride: 108 mEq/L (ref 96–112)
Potassium: 5.1 mEq/L (ref 3.5–5.1)

## 2013-04-17 LAB — LIPID PANEL
Cholesterol: 225 mg/dL — ABNORMAL HIGH (ref 0–200)
Total CHOL/HDL Ratio: 4
Triglycerides: 97 mg/dL (ref 0.0–149.0)

## 2013-04-17 LAB — LDL CHOLESTEROL, DIRECT: Direct LDL: 147.5 mg/dL

## 2013-04-17 NOTE — Assessment & Plan Note (Signed)
Smoking cessation instruction/counseling given:  counseled patient on the dangers of tobacco use, advised patient to stop smoking, and reviewed strategies to maximize success 

## 2013-04-17 NOTE — Assessment & Plan Note (Signed)
Annual comprehensive exam was done including breast, pelvic without PAP smear. All screenings have been addressed .  

## 2013-04-17 NOTE — Progress Notes (Signed)
Pre-visit discussion using our clinic review tool. No additional management support is needed unless otherwise documented below in the visit note.  

## 2013-04-17 NOTE — Progress Notes (Signed)
Patient ID: Kimberly Schmitt, female   DOB: 14-Mar-1951, 62 y.o.   MRN: [id number]  Subjective:     Kimberly Schmitt is a 62 y.o. female and is here for a comprehensive physical exam. The patient reports no problems.  She has reduced her smoking but recently has had a death in the family so has increased her use slightly.  Some viral URI sympotms since last Tuesday without feversl mylagias and facial pain.   History   Social History  . Marital Status: Married    Spouse Name: N/A    Number of Children: N/A  . Years of Education: N/A   Occupational History  . Not on file.   Social History Main Topics  . Smoking status: Current Every Day Smoker -- 0.50 packs/day    Types: Cigarettes  . Smokeless tobacco: Never Used  . Alcohol Use: No  . Drug Use: No  . Sexual Activity: Not on file   Other Topics Concern  . Not on file   Social History Narrative  . No narrative on file   Health Maintenance  Topic Date Due  . Zostavax  01/23/2009  . Influenza Vaccine  12/09/2012  . Mammogram  05/06/2014  . Pap Smear  04/12/2015  . Colonoscopy  03/12/2021  . Tetanus/tdap  04/06/2021    The following portions of the patient's history were reviewed and updated as appropriate: allergies, current medications, past family history, past medical history, past social history, past surgical history and problem list.  Review of Systems A comprehensive review of systems was negative.   Objective:  \BP 128/74  Pulse 84  Temp(Src) 97.5 F (36.4 C) (Oral)  Resp 12  Ht 5' 0.25" (1.53 m)  Wt 114 lb 12 oz (52.05 kg)  BMI 22.24 kg/m2  SpO2 98%  General Appearance:    Alert, cooperative, no distress, appears stated age  Head:    Normocephalic, without obvious abnormality, atraumatic  Eyes:    PERRL, conjunctiva/corneas clear, EOM's intact, fundi    benign, both eyes  Ears:    Normal TM's and external ear canals, both ears  Nose:   Nares normal, septum midline, mucosa normal, no drainage    or sinus  tenderness  Throat:   Lips, mucosa, and tongue normal; teeth and gums normal  Neck:   Supple, symmetrical, trachea midline, no adenopathy;    thyroid:  no enlargement/tenderness/nodules; no carotid   bruit or JVD  Back:     Symmetric, no curvature, ROM normal, no CVA tenderness  Lungs:     Clear to auscultation bilaterally, respirations unlabored  Chest Wall:    No tenderness or deformity   Heart:    Regular rate and rhythm, S1 and S2 normal, no murmur, rub   or gallop  Breast Exam:    No tenderness, masses, or nipple abnormality  Abdomen:     Soft, non-tender, bowel sounds active all four quadrants,    no masses, no organomegaly  Genitalia:    Normal female without lesion, discharge or tenderness  Rectal:    Deferred  Extremities:   Extremities normal, atraumatic, no cyanosis or edema  Pulses:   2+ and symmetric all extremities  Skin:   Skin color, texture, turgor normal, no rashes or lesions  Lymph nodes:   Cervical, supraclavicular, and axillary nodes normal  Neurologic:   CNII-XII intact, normal strength, sensation and reflexes    throughout    Assessment:   Tobacco abuse counseling Smoking cessation instruction/counseling given:  counseled  patient on the dangers of tobacco use, advised patient to stop smoking, and reviewed strategies to maximize success  Routine general medical examination at a health care facility Annual comprehensive exam was done including breast, pelvic without PAP smear. All screenings have been addressed .    Updated Medication List Outpatient Encounter Prescriptions as of 04/17/2013  Medication Sig  . aspirin 81 MG tablet Take 81 mg by mouth daily.  . Probiotic Product (PROBIOTIC DAILY PO) Take by mouth.

## 2013-04-20 ENCOUNTER — Telehealth: Payer: Self-pay | Admitting: Internal Medicine

## 2013-04-20 NOTE — Telephone Encounter (Signed)
Returned call see lab note

## 2013-04-20 NOTE — Telephone Encounter (Signed)
The patient wants you to call her back at work . [phone] ext.[phone]

## 2013-04-21 DIAGNOSIS — E785 Hyperlipidemia, unspecified: Secondary | ICD-10-CM | POA: Insufficient documentation

## 2013-04-21 MED ORDER — RED YEAST RICE EXTRACT 600 MG PO CAPS: 1.0000 | ORAL_CAPSULE | Freq: Two times a day (BID) | ORAL | Status: DC

## 2013-04-21 NOTE — Addendum Note (Signed)
Addended by: Sherlene Shams on: 04/21/2013 12:08 PM   Modules accepted: Orders

## 2013-05-09 ENCOUNTER — Ambulatory Visit: Payer: Self-pay | Admitting: Internal Medicine

## 2013-05-23 LAB — HM DEXA SCAN

## 2013-05-30 ENCOUNTER — Other Ambulatory Visit: Payer: Self-pay | Admitting: Internal Medicine

## 2013-05-30 DIAGNOSIS — M8580 Other specified disorders of bone density and structure, unspecified site: Secondary | ICD-10-CM

## 2013-06-06 ENCOUNTER — Encounter: Payer: Self-pay | Admitting: Internal Medicine

## 2014-04-27 ENCOUNTER — Encounter: Payer: Self-pay | Admitting: Internal Medicine

## 2014-04-27 ENCOUNTER — Ambulatory Visit (INDEPENDENT_AMBULATORY_CARE_PROVIDER_SITE_OTHER): Payer: BC Managed Care – PPO | Admitting: Internal Medicine

## 2014-04-27 VITALS — BP 106/74 | HR 81 | Temp 97.9°F | Wt 115.0 lb

## 2014-04-27 DIAGNOSIS — M79632 Pain in left forearm: Secondary | ICD-10-CM

## 2014-04-27 MED ORDER — PREDNISONE 10 MG PO TABS: ORAL_TABLET | ORAL | Status: DC

## 2014-04-27 NOTE — Progress Notes (Signed)
Subjective:    Patient ID: Kimberly Schmitt, female    DOB: 09-11-1950, 63 y.o.   MRN: [id number]  HPI  Pt presents to the clinic today with c/o left wrist pain. She reports this started 1 week ago. She woke up with the pain. She denies any injury to the area. The pain radiates up her arm. She reports numbness and tingling in her left hand. She has not noticed any swelling. She has taken Tylenol and leftover Norco with some relief. She is left hand dominate. She does type on a computer daily. She does not sleep on her hands.  Review of Systems      Past Medical History  Diagnosis Date  . Hyperlipidemia     220 per patient    Current Outpatient Prescriptions  Medication Sig Dispense Refill  . aspirin 81 MG tablet Take 81 mg by mouth daily.    . Probiotic Product (PROBIOTIC DAILY PO) Take by mouth.    . Red Yeast Rice Extract 600 MG CAPS Take 1 capsule (600 mg total) by mouth 2 (two) times daily. 180 capsule 0   No current facility-administered medications for this visit.    No Known Allergies  Family History  Problem Relation Age of Onset  . Cancer Father 51    colon or  lung CA  . Diabetes Sister   . Cancer Brother 70    Pancreatic and Liver cancer  . Stroke Maternal Aunt   . Cancer Maternal Grandmother     History   Social History  . Marital Status: Married    Spouse Name: N/A    Number of Children: N/A  . Years of Education: N/A   Occupational History  . Not on file.   Social History Main Topics  . Smoking status: Current Every Day Smoker -- 0.50 packs/day    Types: Cigarettes  . Smokeless tobacco: Never Used  . Alcohol Use: No  . Drug Use: No  . Sexual Activity: Not on file   Other Topics Concern  . Not on file   Social History Narrative     Constitutional: Denies fever, malaise, fatigue, headache or abrupt weight changes.  Musculoskeletal: Pt reports left wrist pain. Denies decrease in range of motion, difficulty with gait, muscle pain or joint  swelling.  Skin: Denies redness, rashes, lesions or ulcercations.  Neurological: Pt reports numbness and tingling in hand. Denies difficulty with coordination.   No other specific complaints in a complete review of systems (except as listed in HPI above).  Objective:   Physical Exam   BP 106/74 mmHg  Pulse 81  Temp(Src) 97.9 F (36.6 C) (Oral)  Wt 115 lb (52.164 kg)  SpO2 98% Wt Readings from Last 3 Encounters:  04/27/14 115 lb (52.164 kg)  04/17/13 114 lb 12 oz (52.05 kg)  01/24/13 118 lb (53.524 kg)    General: Appears her stated age,  in NAD. Skin: Warm, dry and intact. No rashes, lesions or ulcerations noted. Cardiovascular: Normal rate and rhythm. S1,S2 noted.  No murmur, rubs or gallops noted. Pulmonary/Chest: Normal effort and positive vesicular breath sounds. No respiratory distress. No wheezes, rales or ronchi noted.  Musculoskeletal: Normal flexion, extension and rotation of the left wrist. No swelling noted. Pain with palpation of the posterior forearm. Hand grips 5/5 and equal bilaterally. Neurological: Alert and oriented. Sensation intact to bilateral hands. Coordination normal. Negative Phalen, negative Tinels.   BMET    Component Value Date/Time   NA 143 04/17/2013 0840  K 5.1 04/17/2013 0840   CL 108 04/17/2013 0840   CO2 25 04/17/2013 0840   GLUCOSE 99 04/17/2013 0840   BUN 13 04/17/2013 0840   CREATININE 0.7 04/17/2013 0840   CREATININE 0.71 04/11/2012 0914   CALCIUM 10.0 04/17/2013 0840   GFRNONAA >89 04/11/2012 0914   GFRAA >89 04/11/2012 0914    Lipid Panel     Component Value Date/Time   CHOL 225* 04/17/2013 0840   TRIG 97.0 04/17/2013 0840   HDL 60.00 04/17/2013 0840   CHOLHDL 4 04/17/2013 0840   VLDL 19.4 04/17/2013 0840    CBC    Component Value Date/Time   WBC 8.2 01/24/2013 0829   RBC 4.66 01/24/2013 0829   HGB 14.3 01/24/2013 0829   HCT 42.3 01/24/2013 0829   PLT 312.0 01/24/2013 0829   MCV 90.8 01/24/2013 0829   MCHC 33.7  01/24/2013 0829   RDW 14.2 01/24/2013 0829   LYMPHSABS 2.3 01/24/2013 0829   MONOABS 0.5 01/24/2013 0829   EOSABS 0.1 01/24/2013 0829   BASOSABS 0.1 01/24/2013 0829    Hgb A1C No results found for: HGBA1C      Assessment & Plan:   Left forearm pain:  ? Tendonitis eRx for pred taper x 6 days A heating pad may be helpful Ok to take tylenol for breakthrough pain  She will follow up with her PCP in 1 week if pain persist or worsens

## 2014-04-27 NOTE — Progress Notes (Signed)
Pre visit review using our clinic review tool, if applicable. No additional management support is needed unless otherwise documented below in the visit note. 

## 2014-04-27 NOTE — Patient Instructions (Signed)

## 2014-05-01 ENCOUNTER — Telehealth: Payer: Self-pay | Admitting: Internal Medicine

## 2014-05-01 NOTE — Telephone Encounter (Signed)
emmi mailed  °

## 2014-05-02 ENCOUNTER — Encounter: Payer: Self-pay | Admitting: Internal Medicine

## 2014-05-02 ENCOUNTER — Other Ambulatory Visit (HOSPITAL_COMMUNITY)
Admission: RE | Admit: 2014-05-02 | Discharge: 2014-05-02 | Disposition: A | Discharge status: Home / Self Care | Payer: BC Managed Care – PPO | Source: Ambulatory Visit | Attending: Internal Medicine | Admitting: Internal Medicine

## 2014-05-02 ENCOUNTER — Ambulatory Visit (INDEPENDENT_AMBULATORY_CARE_PROVIDER_SITE_OTHER): Payer: BC Managed Care – PPO | Admitting: Internal Medicine

## 2014-05-02 VITALS — BP 100/76 | HR 86 | Temp 98.5°F | Resp 14 | Ht 59.5 in | Wt 114.5 lb

## 2014-05-02 DIAGNOSIS — Z1239 Encounter for other screening for malignant neoplasm of breast: Secondary | ICD-10-CM

## 2014-05-02 DIAGNOSIS — N941 Dyspareunia: Secondary | ICD-10-CM

## 2014-05-02 DIAGNOSIS — E785 Hyperlipidemia, unspecified: Secondary | ICD-10-CM

## 2014-05-02 DIAGNOSIS — Z124 Encounter for screening for malignant neoplasm of cervix: Secondary | ICD-10-CM

## 2014-05-02 DIAGNOSIS — Z1151 Encounter for screening for human papillomavirus (HPV): Secondary | ICD-10-CM | POA: Diagnosis present

## 2014-05-02 DIAGNOSIS — Z716 Tobacco abuse counseling: Secondary | ICD-10-CM

## 2014-05-02 DIAGNOSIS — IMO0002 Reserved for concepts with insufficient information to code with codable children: Secondary | ICD-10-CM

## 2014-05-02 DIAGNOSIS — E559 Vitamin D deficiency, unspecified: Secondary | ICD-10-CM

## 2014-05-02 DIAGNOSIS — Z01419 Encounter for gynecological examination (general) (routine) without abnormal findings: Secondary | ICD-10-CM | POA: Diagnosis not present

## 2014-05-02 DIAGNOSIS — M8580 Other specified disorders of bone density and structure, unspecified site: Secondary | ICD-10-CM

## 2014-05-02 DIAGNOSIS — R5383 Other fatigue: Secondary | ICD-10-CM

## 2014-05-02 DIAGNOSIS — Z23 Encounter for immunization: Secondary | ICD-10-CM

## 2014-05-02 DIAGNOSIS — Z0000 Encounter for general adult medical examination without abnormal findings: Secondary | ICD-10-CM

## 2014-05-02 LAB — COMPREHENSIVE METABOLIC PANEL
ALT: 28 U/L (ref 0–35)
AST: 23 U/L (ref 0–37)
Albumin: 4.8 g/dL (ref 3.5–5.2)
Alkaline Phosphatase: 51 U/L (ref 39–117)
BILIRUBIN TOTAL: 0.5 mg/dL (ref 0.2–1.2)
BUN: 13 mg/dL (ref 6–23)
CO2: 24 mEq/L (ref 19–32)
Calcium: 9.8 mg/dL (ref 8.4–10.5)
Chloride: 106 mEq/L (ref 96–112)
Creatinine, Ser: 0.7 mg/dL (ref 0.4–1.2)
GFR: 86.33 mL/min (ref >60.00)
GLUCOSE: 78 mg/dL (ref 70–99)
Potassium: 4.5 mEq/L (ref 3.5–5.1)
Sodium: 141 mEq/L (ref 135–145)
Total Protein: 7.6 g/dL (ref 6.0–8.3)

## 2014-05-02 LAB — LIPID PANEL
CHOLESTEROL: 238 mg/dL — AB (ref 0–200)
HDL: 72.5 mg/dL (ref >39.00)
LDL Cholesterol: 150 mg/dL — ABNORMAL HIGH (ref 0–99)
NonHDL: 165.5
TRIGLYCERIDES: 80 mg/dL (ref 0.0–149.0)
Total CHOL/HDL Ratio: 3
VLDL: 16 mg/dL (ref 0.0–40.0)

## 2014-05-02 LAB — CBC WITH DIFFERENTIAL/PLATELET
Basophils Absolute: 0 10*3/uL (ref 0.0–0.1)
Basophils Relative: 0.3 % (ref 0.0–3.0)
Eosinophils Absolute: 0 10*3/uL (ref 0.0–0.7)
Eosinophils Relative: 0.1 % (ref 0.0–5.0)
HEMATOCRIT: 43.5 % (ref 36.0–46.0)
HEMOGLOBIN: 14.4 g/dL (ref 12.0–15.0)
Lymphocytes Relative: 26.9 % (ref 12.0–46.0)
Lymphs Abs: 3.4 10*3/uL (ref 0.7–4.0)
MCHC: 33.1 g/dL (ref 30.0–36.0)
MCV: 92.3 fl (ref 78.0–100.0)
MONO ABS: 0.7 10*3/uL (ref 0.1–1.0)
Monocytes Relative: 5.5 % (ref 3.0–12.0)
NEUTROS ABS: 8.5 10*3/uL — AB (ref 1.4–7.7)
Neutrophils Relative %: 67.2 % (ref 43.0–77.0)
PLATELETS: 367 10*3/uL (ref 150.0–400.0)
RBC: 4.71 Mil/uL (ref 3.87–5.11)
RDW: 14.4 % (ref 11.5–15.5)
WBC: 12.7 10*3/uL — ABNORMAL HIGH (ref 4.0–10.5)

## 2014-05-02 LAB — TSH: TSH: 2.25 u[IU]/mL (ref 0.35–4.50)

## 2014-05-02 LAB — VITAMIN D 25 HYDROXY (VIT D DEFICIENCY, FRACTURES): VITD: 34.55 ng/mL (ref 30.00–100.00)

## 2014-05-02 MED ORDER — MELOXICAM 15 MG PO TABS: 15.0000 mg | ORAL_TABLET | Freq: Every day | ORAL | Status: DC

## 2014-05-02 NOTE — Progress Notes (Addendum)
Patient ID: [name], female   DOB: 1951-01-29, 63 y.o.   MRN: [id number]   Subjective:     Kimberly Schmitt is a 63 y.o. female and is here for a comprehensive physical exam. The patient reports several minor acute issues .  She was treated for left arm pain resumed to be secondary to tendonitis with a prednisone taper by [facility]mith last Friday.    Having some discomfort with penetration during intercourse,  Doesn't have intercourse very often . Denies bleeding and discharge.   She continues to smoke but follows a healthy diet, exercises regularly, and takes a baby aspirin daily   History   Social History  . Marital Status: Married    Spouse Name: N/A    Number of Children: N/A  . Years of Education: N/A   Occupational History  . Not on file.   Social History Main Topics  . Smoking status: Current Every Day Smoker -- 0.50 packs/day    Types: Cigarettes  . Smokeless tobacco: Never Used  . Alcohol Use: No  . Drug Use: No  . Sexual Activity: Not on file   Other Topics Concern  . Not on file   Social History Narrative   Health Maintenance  Topic Date Due  . ZOSTAVAX  01/23/2009  . INFLUENZA VACCINE  12/10/2014  . MAMMOGRAM  05/10/2015  . COLONOSCOPY  03/12/2021  . TETANUS/TDAP  04/06/2021  . DEXA SCAN  Completed  . PNEUMOCOCCAL POLYSACCHARIDE VACCINE AGE 36 AND OVER  Completed    The following portions of the patient's history were reviewed and updated as appropriate: allergies, current medications, past family history, past medical history, past social history, past surgical history and problem list.  Review of Systems A comprehensive review of systems was negative.  Patient denies headache, fevers, malaise, unintentional weight loss, skin rash, eye pain, sinus congestion and sinus pain, sore throat, dysphagia,  hemoptysis , cough, dyspnea, wheezing, chest pain, palpitations, orthopnea, edema, abdominal pain, nausea, melena, diarrhea, constipation, flank pain,  dysuria, hematuria, urinary  Frequency, nocturia, numbness, tingling, seizures,  Focal weakness, Loss of consciousness,  Tremor, insomnia, depression, anxiety, and suicidal ideation.      Objective:  BP 100/76 mmHg  Pulse 86  Temp(Src) 98.5 F (36.9 C) (Oral)  Resp 14  Ht 4' 11.5" (1.511 m)  Wt 114 lb 8 oz (51.937 kg)  BMI 22.75 kg/m2  SpO2 98% General Appearance:    Alert, cooperative, no distress, appears stated age  Head:    Normocephalic, without obvious abnormality, atraumatic  Eyes:    PERRL, conjunctiva/corneas clear, EOM's intact, fundi    benign, both eyes  Ears:    Normal TM's and external ear canals, both ears  Nose:   Nares normal, septum midline, mucosa normal, no drainage    or sinus tenderness  Throat:   Lips, mucosa, and tongue normal; teeth and gums normal  Neck:   Supple, symmetrical, trachea midline, no adenopathy;    thyroid:  no enlargement/tenderness/nodules; no carotid   bruit or JVD  Back:     Symmetric, no curvature, ROM normal, no CVA tenderness  Lungs:     Clear to auscultation bilaterally, respirations unlabored  Chest Wall:    No tenderness or deformity   Heart:    Regular rate and rhythm, S1 and S2 normal, no murmur, rub   or gallop  Breast Exam:    No tenderness, masses, or nipple abnormality  Abdomen:     Soft, non-tender, bowel sounds  active all four quadrants,    no masses, no organomegaly  Genitalia:    Pelvic: cervix normal in appearance, external genitalia normal, no adnexal masses or tenderness, no cervical motion tenderness, rectovaginal septum normal, uterus normal size, shape, and consistency and vagina normal without discharge  Extremities:   Extremities normal, atraumatic, no cyanosis or edema  Pulses:   2+ and symmetric all extremities  Skin:   Skin color, texture, turgor normal, no rashes or lesions  Lymph nodes:   Cervical, supraclavicular, and axillary nodes normal  Neurologic:   CNII-XII intact, normal strength, sensation and  reflexes    throughout     Assessment and Plan:   Problem List Items Addressed This Visit    Dyspareunia    Secondary to narrow vaginal vault and disuse.  HRt c/i secondary to ongoing tobacco abuse  .  OTC lubricants and vaginal dilators discussed  Briefly,     Hyperlipidemia    Statin was recommended at previous screening and deferred by patient . Based on current lipid profile, the risk of clinically significant CAD is 7% over the next 10 years, using the Framingham risk calculator. Continue daily baby aspirin therapy and encouragement to quit smoking   Lab Results  Component Value Date   CHOL 238* 05/02/2014   HDL 72.50 05/02/2014   LDLCALC 150* 05/02/2014   LDLDIRECT 147.5 04/17/2013   TRIG 80.0 05/02/2014   CHOLHDL 3 05/02/2014         Relevant Orders      Lipid panel (Completed)   Osteopenia    She has had a SS decrease in bone mass since 2011,  And T score of hip is now -2.4 .  Will recommend alendronate. Discussed the current controversies surrounding the risks and benefits of calcium supplementation.  Encouraged her to increase dietary calcium through natural foods including almond/coconut milk    Routine general medical examination at a health care facility    Annual wellness,  comprehensive GYN and physical exam and management of acute and chronic conditions .  During the course of the visit the patient was educated and counseled about appropriate screening and preventive services including :  diabetes screening, lipid analysis with projected  10 year  risk for CAD , nutrition counseling, colorectal cancer screening, and recommended immunizations.  Printed recommendations for health maintenance screenings was given.     Screening for cervical cancer    PAP smear was normal today    Tobacco abuse counseling    Smoking cessation instruction/counseling given:  counseled patient on the dangers of tobacco use, advised patient to stop smoking, and reviewed strategies to  maximize success     Other Visit Diagnoses    Cervical cancer screening    -  Primary    Relevant Orders       Cytology - PAP (Completed)    Vitamin D deficiency        Relevant Orders       Vit D  25 hydroxy (rtn osteoporosis monitoring) (Completed)    Other fatigue        Relevant Orders       TSH (Completed)       Comprehensive metabolic panel (Completed)       CBC with Differential (Completed)    Breast cancer screening        Relevant Orders       MM DIGITAL SCREENING BILATERAL    Need for vaccination with 13-polyvalent pneumococcal conjugate vaccine  Relevant Orders       Pneumococcal conjugate vaccine 13-valent (Completed)     '

## 2014-05-02 NOTE — Progress Notes (Signed)
Pre visit review using our clinic review tool, if applicable. No additional management support is needed unless otherwise documented below in the visit note. 

## 2014-05-02 NOTE — Patient Instructions (Addendum)
You  Have  Tendonitis of the forearm.  Finish the prednisone   And then start meloxicma once daily as your anti inflammatory,  No aleve or motrin ,  Ok to add tylenol or tramadol  Rest the forearm as much as possible over the next week,  No stirring or kneading or SCREW DRIVING !!  We discussed trying [place]'s personal vaginal lubricant in suppository form,  Or Astroglide (both are available OTC).  We also discussed gradually dilating your vaginal vault at home using vaginal dilators Quita Skye and Eve probably carries them, along with a website called [email or web address])  Dyspareunia Dyspareunia is pain during sexual intercourse. It is most common in women, but it also happens in men.  CAUSES  Female The pain from this condition is usually felt when anything is put into the vagina, but any part of the genitals may cause pain during sex. Even sitting or wearing pants can cause pain. Sometimes, a cause cannot be found. Some causes of pain during intercourse are:  Infections of the skin around the vagina.  Vaginal infections, such as a yeast, bacterial, or viral infection.  Vaginismus. This is the inability to have anything put in the vagina even when the woman wants it to happen. There is an automatic muscle contraction and pain. The pain of the muscle contraction can be so severe that intercourse is impossible.  Allergic reaction from spermicides, semen, condoms, scented tampons, soaps, douches, and vaginal sprays.  A fluid-filled sac (cyst) on the Bartholin or Skene glands, located at the opening of the vagina.  Scar tissue in the vagina from a surgically enlarged opening (episiotomy) or tearing after delivering a baby.  Vaginal dryness. This is more common in menopause. The normal secretions of the vagina are decreased. Changes in estrogen levels and increased difficulty becoming aroused can cause painful sex. Vaginal dryness can also happen when taking birth control pills.  Thinning of the tissue  (atrophy) of the vulva and vagina. This makes the area thinner, smaller, unable to stretch to accommodate a penis, and prone to infection and tearing.  Vulvar vestibulitis or vestibulodynia.This is a condition that causes pain involving the area around the entrance to the vagina.The most common cause in young women is birth control pills.Women with low estrogen levels (postmenopausal women) may also experience this.Other causes include allergic reactions, too many nerve endings, skin conditions, and pelvic muscles that cannot relax.  Vulvar dermatoses. This includes skin conditions such as lichen sclerosus and lichen planus.  Lack of foreplay to lubricate the vagina. This can cause vaginal dryness.  Noncancerous tumors (fibroids) in the uterus.  Uterus lining tissue growing outside the uterus (endometriosis).  Pregnancy that starts in the fallopian tube (tubal pregnancy).  Pregnancy or breastfeeding your baby. This can cause vaginal dryness.  A tilting or prolapse of the uterus. Prolapse is when weak and stretched muscles around the uterus allow it to fall into the vagina.  Problems with the ovaries, cysts, or scar tissue. This may be worse with certain sexual positions.  Previous surgeries causing adhesions or scar tissue in the vagina or pelvis.  Bladder and intestinal problems.  Psychological problems (such as depression or anxiety). This may make pain worse.  Negative attitudes about sex, experiencing rape, sexual assault, and misinformation about sex. These issues are often related to some types of pain.  Previous pelvic infection, causing scar tissue in the pelvis and on the female organs.  Cyst or tumor on the ovary.  Cancer of the female organs.  Certain medicines.  Medical problems such as diabetes, arthritis, or thyroid disease. Female In men, there are many physical causes of sexual discomfort. Some causes of pain during intercourse are:  Infections of the  prostate, bladder, or seminal vesicles. This can cause pain after ejaculation.  An inflamed bladder (interstitial cystitis). This may cause pain from ejaculation.  Gonorrheal infections. This may cause pain during ejaculation.  An inflamed urethra (urethritis) or inflamed prostate (prostatitis). This can make genital stimulation painful or uncomfortable.  Deformities of the penis, such as Peyronie's disease.  A tight foreskin.  Cancer of the female organs.  Psychological problems. This may make pain worse. DIAGNOSIS   Your caregiver will take a history and have you describe where the pain is located (outside the vagina, in the vagina, in the pelvis). You may be asked when you experience pain, such as with penetration or with thrusting.  Following this, your caregiver will do a physical exam. Let your caregiver know if the exam is too painful.  During the final part of the female exam, your caregiver will feel your uterus and ovaries with one hand on the abdomen and one finger in your vagina. This is a pelvic exam.  Blood tests, a Pap test, cultures for infection, an ultrasound test, and X-rays may be done. You may need to see a specialist for female problems (gynecologist).  Your caregiver may do a CT scan, MRI, or laparoscopy. Laparoscopy is a procedure to look into the pelvis with a lighted tube, through a cut (incision) in the abdomen. TREATMENT  Your caregiver can help you determine the best course of treatment. Sometimes, more testing is done. Continue with the suggested testing until your caregiver feels sure about your diagnosis and how to treat it. Sometimes, it is difficult to find the reason for the pain. The search for the cause and treatment can be frustrating. Treatment often takes several weeks to a few months before you notice any improvement. You may also need to avoid sexual activity until symptoms improve.Continuing to have sex when it hurts can delay healing and actually  make the problem worse. The treatment depends on the cause of the pain. Treatment may include:  Medicines such as antibiotics, vaginal or skin creams, hormones, or antidepressants.  Minor or major surgery.  Psychological counseling or group therapy.  Kegel exercises and vaginal dilators to help certain cases of vaginismus (spasms). Do this only if recommended by your caregiver.Kegel exercises can make some problems worse.  Applying lubrication as recommended by your caregiver if you have dryness.  Sex therapy for you and your sex partner. It is common for the pain to continue after the reason for the pain has been treated. Some reasons for this include a conditioned response. This means the person having the pain becomes so familiar with the pain that the pain continues as a response, even though the cause is removed. Sex therapy can help with this problem. HOME CARE INSTRUCTIONS   Follow your caregiver's instructions about taking medicines, tests, counseling, and follow-up treatment.  Do not use scented tampons, douches, vaginal sprays, or soaps.  Use water-based lubricants for dryness. Oil lubricants can cause irritation.  Do not use spermicides or condoms that irritate you.  Openly discuss with your partner your sexual experience, your desires, foreplay, and different sexual positions for a more comfortable and enjoyable sexual relationship.  Join group sessions for therapy, if needed.  Practice safe sex at all times.  Empty your bladder before having intercourse.  Try different positions during sexual intercourse.  Take over-the-counter pain medicine recommended by your caregiver before having sexual intercourse.  Do not wear pantyhose. Knee-high and thigh-high hose are okay.  Avoid scrubbing your vulva with a washcloth. Wash the area gently and pat dry with a towel. SEEK MEDICAL CARE IF:   You develop vaginal bleeding after sexual intercourse.  You develop a lump at  the opening of your vagina, even if it is not painful.  You have abnormal vaginal discharge.  You have vaginal dryness.  You have itching or irritation of the vulva or vagina.  You develop a rash or reaction to your medicine. SEEK IMMEDIATE MEDICAL CARE IF:   You develop severe abdominal pain during or shortly after sexual intercourse. You could have a ruptured ovarian cyst or ruptured tubal pregnancy.  You have a fever.  You have painful or bloody urination.  You have painful sexual intercourse, and you never had it before.  You pass out after having sexual intercourse. Document Released: 05/17/2007 Document Revised: 07/20/2011 Document Reviewed: 07/28/2010 [facility] Patient Information 2015 [place], [place]. This information is not intended to replace advice given to you by your health care provider. Make sure you discuss any questions you have with your health care provider.  Health Maintenance Adopting a healthy lifestyle and getting preventive care can go a long way to promote health and wellness. Talk with your health care provider about what schedule of regular examinations is right for you. This is a good chance for you to check in with your provider about disease prevention and staying healthy. In between checkups, there are plenty of things you can do on your own. Experts have done a lot of research about which lifestyle changes and preventive measures are most likely to keep you healthy. Ask your health care provider for more information. WEIGHT AND DIET  Eat a healthy diet  Be sure to include plenty of vegetables, fruits, low-fat dairy products, and lean protein.  Do not eat a lot of foods high in solid fats, added sugars, or salt.  Get regular exercise. This is one of the most important things you can do for your health.  Most adults should exercise for at least 150 minutes each week. The exercise should increase your heart rate and make you sweat (moderate-intensity  exercise).  Most adults should also do strengthening exercises at least twice a week. This is in addition to the moderate-intensity exercise.  Maintain a healthy weight  Body mass index (BMI) is a measurement that can be used to identify possible weight problems. It estimates body fat based on height and weight. Your health care provider can help determine your BMI and help you achieve or maintain a healthy weight.  For females 22 years of age and older:   A BMI below 18.5 is considered underweight.  A BMI of 18.5 to 24.9 is normal.  A BMI of 25 to 29.9 is considered overweight.  A BMI of 30 and above is considered obese.  Watch levels of cholesterol and blood lipids  You should start having your blood tested for lipids and cholesterol at 65 years of age, then have this test every 5 years.  You may need to have your cholesterol levels checked more often if:  Your lipid or cholesterol levels are high.  You are older than 65 years of age.  You are at high risk for heart disease.  CANCER SCREENING   Lung Cancer  Lung cancer screening is recommended for  adults 12-49 years old who are at high risk for lung cancer because of a history of smoking.  A yearly low-dose CT scan of the lungs is recommended for people who:  Currently smoke.  Have quit within the past 15 years.  Have at least a 30-pack-year history of smoking. A pack year is smoking an average of one pack of cigarettes a day for 1 year.  Yearly screening should continue until it has been 15 years since you quit.  Yearly screening should stop if you develop a health problem that would prevent you from having lung cancer treatment.  Breast Cancer  Practice breast self-awareness. This means understanding how your breasts normally appear and feel.  It also means doing regular breast self-exams. Let your health care provider know about any changes, no matter how small.  If you are in your 20s or 30s, you should  have a clinical breast exam (CBE) by a health care provider every 1-3 years as part of a regular health exam.  If you are 35 or older, have a CBE every year. Also consider having a breast X-ray (mammogram) every year.  If you have a family history of breast cancer, talk to your health care provider about genetic screening.  If you are at high risk for breast cancer, talk to your health care provider about having an MRI and a mammogram every year.  Breast cancer gene (BRCA) assessment is recommended for women who have family members with BRCA-related cancers. BRCA-related cancers include:  Breast.  Ovarian.  Tubal.  Peritoneal cancers.  Results of the assessment will determine the need for genetic counseling and BRCA1 and BRCA2 testing. Cervical Cancer Routine pelvic examinations to screen for cervical cancer are no longer recommended for nonpregnant women who are considered low risk for cancer of the pelvic organs (ovaries, uterus, and vagina) and who do not have symptoms. A pelvic examination may be necessary if you have symptoms including those associated with pelvic infections. Ask your health care provider if a screening pelvic exam is right for you.   The Pap test is the screening test for cervical cancer for women who are considered at risk.  If you had a hysterectomy for a problem that was not cancer or a condition that could lead to cancer, then you no longer need Pap tests.  If you are older than 65 years, and you have had normal Pap tests for the past 10 years, you no longer need to have Pap tests.  If you have had past treatment for cervical cancer or a condition that could lead to cancer, you need Pap tests and screening for cancer for at least 20 years after your treatment.  If you no longer get a Pap test, assess your risk factors if they change (such as having a new sexual partner). This can affect whether you should start being screened again.  Some women have medical  problems that increase their chance of getting cervical cancer. If this is the case for you, your health care provider may recommend more frequent screening and Pap tests.  The human papillomavirus (HPV) test is another test that may be used for cervical cancer screening. The HPV test looks for the virus that can cause cell changes in the cervix. The cells collected during the Pap test can be tested for HPV.  The HPV test can be used to screen women 61 years of age and older. Getting tested for HPV can extend the interval between normal Pap  tests from three to five years.  An HPV test also should be used to screen women of any age who have unclear Pap test results.  After 66 years of age, women should have HPV testing as often as Pap tests.  Colorectal Cancer  This type of cancer can be detected and often prevented.  Routine colorectal cancer screening usually begins at 65 years of age and continues through 65 years of age.  Your health care provider may recommend screening at an earlier age if you have risk factors for colon cancer.  Your health care provider may also recommend using home test kits to check for hidden blood in the stool.  A small camera at the end of a tube can be used to examine your colon directly (sigmoidoscopy or colonoscopy). This is done to check for the earliest forms of colorectal cancer.  Routine screening usually begins at age 93.  Direct examination of the colon should be repeated every 5-10 years through 65 years of age. However, you may need to be screened more often if early forms of precancerous polyps or small growths are found. Skin Cancer  Check your skin from head to toe regularly.  Tell your health care provider about any new moles or changes in moles, especially if there is a change in a mole's shape or color.  Also tell your health care provider if you have a mole that is larger than the size of a pencil eraser.  Always use sunscreen. Apply  sunscreen liberally and repeatedly throughout the day.  Protect yourself by wearing long sleeves, pants, a wide-brimmed hat, and sunglasses whenever you are outside. HEART DISEASE, DIABETES, AND HIGH BLOOD PRESSURE   Have your blood pressure checked at least every 1-2 years. High blood pressure causes heart disease and increases the risk of stroke.  If you are between 4 years and 79 years old, ask your health care provider if you should take aspirin to prevent strokes.  Have regular diabetes screenings. This involves taking a blood sample to check your fasting blood sugar level.  If you are at a normal weight and have a low risk for diabetes, have this test once every three years after 65 years of age.  If you are overweight and have a high risk for diabetes, consider being tested at a younger age or more often. PREVENTING INFECTION  Hepatitis B  If you have a higher risk for hepatitis B, you should be screened for this virus. You are considered at high risk for hepatitis B if:  You were born in a country where hepatitis B is common. Ask your health care provider which countries are considered high risk.  Your parents were born in a high-risk country, and you have not been immunized against hepatitis B (hepatitis B vaccine).  You have HIV or AIDS.  You use needles to inject street drugs.  You live with someone who has hepatitis B.  You have had sex with someone who has hepatitis B.  You get hemodialysis treatment.  You take certain medicines for conditions, including cancer, organ transplantation, and autoimmune conditions. Hepatitis C  Blood testing is recommended for:  Everyone born from [birth year] through 1967.  Anyone with known risk factors for hepatitis C. Sexually transmitted infections (STIs)  You should be screened for sexually transmitted infections (STIs) including gonorrhea and chlamydia if:  You are sexually active and are younger than 65 years of age.  You are  older than 65 years of age and  your health care provider tells you that you are at risk for this type of infection.  Your sexual activity has changed since you were last screened and you are at an increased risk for chlamydia or gonorrhea. Ask your health care provider if you are at risk.  If you do not have HIV, but are at risk, it may be recommended that you take a prescription medicine daily to prevent HIV infection. This is called pre-exposure prophylaxis (PrEP). You are considered at risk if:  You are sexually active and do not regularly use condoms or know the HIV status of your partner(s).  You take drugs by injection.  You are sexually active with a partner who has HIV. Talk with your health care provider about whether you are at high risk of being infected with HIV. If you choose to begin PrEP, you should first be tested for HIV. You should then be tested every 3 months for as long as you are taking PrEP.  PREGNANCY   If you are premenopausal and you may become pregnant, ask your health care provider about preconception counseling.  If you may become pregnant, take 400 to 800 micrograms (mcg) of folic acid every day.  If you want to prevent pregnancy, talk to your health care provider about birth control (contraception). OSTEOPOROSIS AND MENOPAUSE   Osteoporosis is a disease in which the bones lose minerals and strength with aging. This can result in serious bone fractures. Your risk for osteoporosis can be identified using a bone density scan.  If you are 17 years of age or older, or if you are at risk for osteoporosis and fractures, ask your health care provider if you should be screened.  Ask your health care provider whether you should take a calcium or vitamin D supplement to lower your risk for osteoporosis.  Menopause may have certain physical symptoms and risks.  Hormone replacement therapy may reduce some of these symptoms and risks. Talk to your health care provider  about whether hormone replacement therapy is right for you.  HOME CARE INSTRUCTIONS   Schedule regular health, dental, and eye exams.  Stay current with your immunizations.   Do not use any tobacco products including cigarettes, chewing tobacco, or electronic cigarettes.  If you are pregnant, do not drink alcohol.  If you are breastfeeding, limit how much and how often you drink alcohol.  Limit alcohol intake to no more than 1 drink per day for nonpregnant women. One drink equals 12 ounces of beer, 5 ounces of wine, or 1 ounces of hard liquor.  Do not use street drugs.  Do not share needles.  Ask your health care provider for help if you need support or information about quitting drugs.  Tell your health care provider if you often feel depressed.  Tell your health care provider if you have ever been abused or do not feel safe at home. Document Released: 11/10/2010 Document Revised: 09/11/2013 Document Reviewed: 03/29/2013 [facility] Patient Information 2015 [place], [place]. This information is not intended to replace advice given to you by your health care provider. Make sure you discuss any questions you have with your health care provider.

## 2014-05-03 LAB — CYTOLOGY - PAP

## 2014-05-04 ENCOUNTER — Encounter: Payer: Self-pay | Admitting: Internal Medicine

## 2014-05-04 DIAGNOSIS — IMO0002 Reserved for concepts with insufficient information to code with codable children: Secondary | ICD-10-CM | POA: Insufficient documentation

## 2014-05-04 DIAGNOSIS — E785 Hyperlipidemia, unspecified: Secondary | ICD-10-CM | POA: Insufficient documentation

## 2014-05-04 NOTE — Assessment & Plan Note (Signed)
PAP smear was normal today

## 2014-05-04 NOTE — Assessment & Plan Note (Signed)
Smoking cessation instruction/counseling given:  counseled patient on the dangers of tobacco use, advised patient to stop smoking, and reviewed strategies to maximize success 

## 2014-05-04 NOTE — Assessment & Plan Note (Signed)
Secondary to narrow vaginal vault and disuse.  HRt c/i secondary to ongoing tobacco abuse  .  OTC lubricants and vaginal dilators discussed  Briefly,

## 2014-05-04 NOTE — Assessment & Plan Note (Signed)
Annual wellness,  comprehensive GYN and physical exam and management of acute and chronic conditions .  During the course of the visit the patient was educated and counseled about appropriate screening and preventive services including :  diabetes screening, lipid analysis with projected  10 year  risk for CAD , nutrition counseling, colorectal cancer screening, and recommended immunizations.  Printed recommendations for health maintenance screenings was given.

## 2014-05-04 NOTE — Assessment & Plan Note (Addendum)
She has had a SS decrease in bone mass since 2011,  And T score of hip is now -2.4 .  Will recommend alendronate. Discussed the current controversies surrounding the risks and benefits of calcium supplementation.  Encouraged her to increase dietary calcium through natural foods including almond/coconut milk

## 2014-05-04 NOTE — Assessment & Plan Note (Signed)
Statin was recommended at previous screening and deferred by patient . Based on current lipid profile, the risk of clinically significant CAD is 7% over the next 10 years, using the Framingham risk calculator. Continue daily baby aspirin therapy and encouragement to quit smoking   Lab Results  Component Value Date   CHOL 238* 05/02/2014   HDL 72.50 05/02/2014   LDLCALC 150* 05/02/2014   LDLDIRECT 147.5 04/17/2013   TRIG 80.0 05/02/2014   CHOLHDL 3 05/02/2014

## 2014-05-08 ENCOUNTER — Encounter: Payer: Self-pay | Admitting: *Deleted

## 2014-05-23 ENCOUNTER — Ambulatory Visit: Payer: Self-pay | Admitting: Internal Medicine

## 2014-05-23 LAB — HM MAMMOGRAPHY: HM MAMMO: NEGATIVE

## 2015-01-17 ENCOUNTER — Telehealth: Payer: Self-pay | Admitting: Internal Medicine

## 2015-05-08 ENCOUNTER — Ambulatory Visit: Payer: Self-pay | Admitting: Internal Medicine

## 2015-06-05 ENCOUNTER — Encounter: Payer: Self-pay | Admitting: Internal Medicine

## 2015-06-05 ENCOUNTER — Ambulatory Visit (INDEPENDENT_AMBULATORY_CARE_PROVIDER_SITE_OTHER): Payer: BLUE CROSS/BLUE SHIELD | Admitting: Internal Medicine

## 2015-06-05 VITALS — BP 102/70 | HR 89 | Temp 97.6°F | Resp 12 | Ht 59.25 in | Wt 115.0 lb

## 2015-06-05 DIAGNOSIS — Z0000 Encounter for general adult medical examination without abnormal findings: Secondary | ICD-10-CM

## 2015-06-05 DIAGNOSIS — Z1239 Encounter for other screening for malignant neoplasm of breast: Secondary | ICD-10-CM

## 2015-06-05 DIAGNOSIS — Z720 Tobacco use: Secondary | ICD-10-CM

## 2015-06-05 DIAGNOSIS — E785 Hyperlipidemia, unspecified: Secondary | ICD-10-CM | POA: Diagnosis not present

## 2015-06-05 DIAGNOSIS — Z716 Tobacco abuse counseling: Secondary | ICD-10-CM

## 2015-06-05 DIAGNOSIS — E559 Vitamin D deficiency, unspecified: Secondary | ICD-10-CM

## 2015-06-05 DIAGNOSIS — R5383 Other fatigue: Secondary | ICD-10-CM | POA: Diagnosis not present

## 2015-06-05 DIAGNOSIS — Z1159 Encounter for screening for other viral diseases: Secondary | ICD-10-CM

## 2015-06-05 LAB — CBC WITH DIFFERENTIAL/PLATELET
BASOS ABS: 0.1 10*3/uL (ref 0.0–0.1)
Basophils Relative: 0.6 % (ref 0.0–3.0)
EOS ABS: 0.1 10*3/uL (ref 0.0–0.7)
EOS PCT: 0.6 % (ref 0.0–5.0)
HCT: 44.6 % (ref 36.0–46.0)
Hemoglobin: 14.5 g/dL (ref 12.0–15.0)
Lymphocytes Relative: 26.3 % (ref 12.0–46.0)
Lymphs Abs: 2.5 10*3/uL (ref 0.7–4.0)
MCHC: 32.5 g/dL (ref 30.0–36.0)
MCV: 92.6 fl (ref 78.0–100.0)
MONO ABS: 0.5 10*3/uL (ref 0.1–1.0)
Monocytes Relative: 4.8 % (ref 3.0–12.0)
NEUTROS PCT: 67.7 % (ref 43.0–77.0)
Neutro Abs: 6.5 10*3/uL (ref 1.4–7.7)
Platelets: 349 10*3/uL (ref 150.0–400.0)
RBC: 4.82 Mil/uL (ref 3.87–5.11)
RDW: 14.3 % (ref 11.5–15.5)
WBC: 9.6 10*3/uL (ref 4.0–10.5)

## 2015-06-05 LAB — LIPID PANEL
Cholesterol: 212 mg/dL — ABNORMAL HIGH (ref 0–200)
HDL: 75.3 mg/dL (ref >39.00)
LDL Cholesterol: 121 mg/dL — ABNORMAL HIGH (ref 0–99)
NonHDL: 136.99
TRIGLYCERIDES: 82 mg/dL (ref 0.0–149.0)
Total CHOL/HDL Ratio: 3
VLDL: 16.4 mg/dL (ref 0.0–40.0)

## 2015-06-05 LAB — COMPREHENSIVE METABOLIC PANEL
ALT: 20 U/L (ref 0–35)
AST: 23 U/L (ref 0–37)
Albumin: 4.6 g/dL (ref 3.5–5.2)
Alkaline Phosphatase: 54 U/L (ref 39–117)
BILIRUBIN TOTAL: 0.5 mg/dL (ref 0.2–1.2)
BUN: 14 mg/dL (ref 6–23)
CALCIUM: 9.9 mg/dL (ref 8.4–10.5)
CO2: 28 mEq/L (ref 19–32)
Chloride: 105 mEq/L (ref 96–112)
Creatinine, Ser: 0.71 mg/dL (ref 0.40–1.20)
GFR: 87.44 mL/min (ref >60.00)
Glucose, Bld: 93 mg/dL (ref 70–99)
Potassium: 4.9 mEq/L (ref 3.5–5.1)
Sodium: 140 mEq/L (ref 135–145)
Total Protein: 7.3 g/dL (ref 6.0–8.3)

## 2015-06-05 LAB — TSH: TSH: 0.9 u[IU]/mL (ref 0.35–4.50)

## 2015-06-05 LAB — VITAMIN D 25 HYDROXY (VIT D DEFICIENCY, FRACTURES): VITD: 31.36 ng/mL (ref 30.00–100.00)

## 2015-06-05 NOTE — Patient Instructions (Signed)

## 2015-06-05 NOTE — Progress Notes (Signed)
Patient ID: Kimberly Schmitt, female    DOB: 1951-01-07  Age: 65 y.o. MRN: [id number]  The patient is here for annual wellness examination and management of other chronic and acute problems.   PAP normal 2015 Mammogram normal 5 yr follow colonoscopy due dec 2017  vaccines up to date  The risk factors are reflected in the social history.  The roster of all physicians providing medical care to patient - is listed in the Snapshot section of the chart.  Activities of daily living:  The patient is 100% independent in all ADLs: dressing, toileting, feeding as well as independent mobility  Home safety : The patient has smoke detectors in the home. They wear seatbelts.  There are no firearms at home. There is no violence in the home.   There is no risks for hepatitis, STDs or HIV. There is no   history of blood transfusion. They have no travel history to infectious disease endemic areas of the world.  The patient has seen their dentist in the last six month. They have seen their eye doctor in the last year. They admit to slight hearing difficulty with regard to whispered voices and some television programs.  They have deferred audiologic testing in the last year.  They do not  have excessive sun exposure. Discussed the need for sun protection: hats, long sleeves and use of sunscreen if there is significant sun exposure.   Diet: the importance of a healthy diet is discussed. They do have a healthy diet.  The benefits of regular aerobic exercise were discussed. She walks 4 times per week ,  20 minutes.   Depression screen: there are no signs or vegative symptoms of depression- irritability, change in appetite, anhedonia, sadness/tearfullness.  Cognitive assessment: the patient manages all their financial and personal affairs and is actively engaged. They could relate day,date,year and events; recalled 2/3 objects at 3 minutes; performed clock-face test normally.  The following portions of the  patient's history were reviewed and updated as appropriate: allergies, current medications, past family history, past medical history,  past surgical history, past social history  and problem list.  Visual acuity was not assessed per patient preference since she has regular follow up with her ophthalmologist. Hearing and body mass index were assessed and reviewed.   During the course of the visit the patient was educated and counseled about appropriate screening and preventive services including : fall prevention , diabetes screening, nutrition counseling, colorectal cancer screening, and recommended immunizations.    CC: The primary encounter diagnosis was Breast cancer screening. Diagnoses of Vitamin D deficiency, Other fatigue, Hyperlipidemia, Need for hepatitis C screening test, Tobacco abuse, Visit for preventive health examination, and Tobacco abuse counseling were also pertinent to this visit.  still smoking 1/2 pack  Day . Spent 3 minutes discussing risk of continued tobacco abuse, including but not limited to CAD, PAD, hypertension, and CA.  He is not interested in pharmacotherapy at this time.  Walking daily  Hips hurt  History of congeintal hip issue on the left    [name] has a past medical history of Hyperlipidemia.   She has past surgical history that includes Appendectomy (1968).   Her family history includes Cancer in her maternal grandmother; Cancer (age of onset: 30) in her brother; Cancer (age of onset: 51) in her father; Diabetes in her sister; Stroke in her maternal aunt.She reports that she has been smoking Cigarettes.  She has been smoking about 0.50 packs per day. She has  never used smokeless tobacco. She reports that she does not drink alcohol or use illicit drugs.  Outpatient Prescriptions Prior to Visit  Medication Sig Dispense Refill  . aspirin 81 MG tablet Take 81 mg by mouth daily.    . Probiotic Product (PROBIOTIC DAILY PO) Take by mouth.    . meloxicam  (MOBIC) 15 MG tablet Take 1 tablet (15 mg total) by mouth daily. (Patient not taking: Reported on 06/05/2015) 30 tablet 0   No facility-administered medications prior to visit.    Review of Systems  Patient denies headache, fevers, malaise, unintentional weight loss, skin rash, eye pain, sinus congestion and sinus pain, sore throat, dysphagia,  hemoptysis , cough, dyspnea, wheezing, chest pain, palpitations, orthopnea, edema, abdominal pain, nausea, melena, diarrhea, constipation, flank pain, dysuria, hematuria, urinary  Frequency, nocturia, numbness, tingling, seizures,  Focal weakness, Loss of consciousness,  Tremor, insomnia, depression, anxiety, and suicidal ideation.      Objective:  BP 102/70 mmHg  Pulse 89  Temp(Src) 97.6 F (36.4 C) (Oral)  Resp 12  Ht 4' 11.25" (1.505 m)  Wt 115 lb (52.164 kg)  BMI 23.03 kg/m2  SpO2 99%  Physical Exam   General appearance: alert, cooperative and appears stated age Head: Normocephalic, without obvious abnormality, atraumatic Eyes: conjunctivae/corneas clear. PERRL, EOM's intact. Fundi benign. Ears: normal TM's and external ear canals both ears Nose: Nares normal. Septum midline. Mucosa normal. No drainage or sinus tenderness. Throat: lips, mucosa, and tongue normal; teeth and gums normal Neck: no adenopathy, no carotid bruit, no JVD, supple, symmetrical, trachea midline and thyroid not enlarged, symmetric, no tenderness/mass/nodules Lungs: clear to auscultation bilaterally Breasts: normal appearance, no masses or tenderness Heart: regular rate and rhythm, S1, S2 normal, no murmur, click, rub or gallop Abdomen: soft, non-tender; bowel sounds normal; no masses,  no organomegaly Extremities: extremities normal, atraumatic, no cyanosis or edema Pulses: 2+ and symmetric Skin: Skin color, texture, turgor normal. No rashes or lesions Neurologic: Alert and oriented X 3, normal strength and tone. Normal symmetric reflexes. Normal coordination and  gait.     Assessment & Plan:   Problem List Items Addressed This Visit    Tobacco abuse counseling    Smoking cessation instruction/counseling given:  counseled patient on the dangers of tobacco use, advised patient to stop smoking, and reviewed strategies to maximize success      Visit for preventive health examination    Annual wellness,  comprehensive  physical exam and management of acute and chronic conditions .  During the course of the visit the patient was educated and counseled about appropriate screening and preventive services including :  diabetes screening, lipid analysis with projected  10 year  risk for CAD , nutrition counseling, colorectal cancer screening, and recommended immunizations.  Printed recommendations for health maintenance screenings was given.         Hyperlipidemia    Statin was recommended at previous screening and deferred by patient . Based on current lipid profile, the risk of clinically significant CAD is 7% over the next 10 years, using the Framingham risk calculator. Continue daily baby aspirin therapy and encouragement to quit smoking   Lab Results  Component Value Date   CHOL 212* 06/05/2015   HDL 75.30 06/05/2015   LDLCALC 121* 06/05/2015   LDLDIRECT 147.5 04/17/2013   TRIG 82.0 06/05/2015   CHOLHDL 3 06/05/2015             Relevant Orders   Lipid panel (Completed)   Tobacco abuse  Relevant Orders   CT CHEST LUNG CA SCREEN LOW DOSE W/O CM    Other Visit Diagnoses    Breast cancer screening    -  Primary    Relevant Orders    MM DIGITAL SCREENING BILATERAL    Vitamin D deficiency        Relevant Orders    VITAMIN D 25 Hydroxy (Vit-D Deficiency, Fractures) (Completed)    Other fatigue        Relevant Orders    Comprehensive metabolic panel (Completed)    TSH (Completed)    CBC with Differential/Platelet (Completed)    Need for hepatitis C screening test        Relevant Orders    Hepatitis C antibody (Completed)       I  have discontinued Kimberly Schmitt's meloxicam. I am also having her maintain her aspirin and Probiotic Product (PROBIOTIC DAILY PO).  No orders of the defined types were placed in this encounter.    Medications Discontinued During This Encounter  Medication Reason  . meloxicam (MOBIC) 15 MG tablet Patient Preference    Follow-up: Return in about 6 months (around 12/03/2015).   Crecencio Mc, MD

## 2015-06-05 NOTE — Progress Notes (Signed)
Pre-visit discussion using our clinic review tool. No additional management support is needed unless otherwise documented below in the visit note.  

## 2015-06-06 LAB — HEPATITIS C ANTIBODY: HCV AB: NEGATIVE

## 2015-06-09 NOTE — Assessment & Plan Note (Signed)
Annual wellness,  comprehensive  physical exam and management of acute and chronic conditions .  During the course of the visit the patient was educated and counseled about appropriate screening and preventive services including :  diabetes screening, lipid analysis with projected  10 year  risk for CAD , nutrition counseling, colorectal cancer screening, and recommended immunizations.  Printed recommendations for health maintenance screenings was given.

## 2015-06-09 NOTE — Assessment & Plan Note (Signed)
Statin was recommended at previous screening and deferred by patient . Based on current lipid profile, the risk of clinically significant CAD is 7% over the next 10 years, using the Framingham risk calculator. Continue daily baby aspirin therapy and encouragement to quit smoking   Lab Results  Component Value Date   CHOL 212* 06/05/2015   HDL 75.30 06/05/2015   LDLCALC 121* 06/05/2015   LDLDIRECT 147.5 04/17/2013   TRIG 82.0 06/05/2015   CHOLHDL 3 06/05/2015

## 2015-06-09 NOTE — Assessment & Plan Note (Signed)
Smoking cessation instruction/counseling given:  counseled patient on the dangers of tobacco use, advised patient to stop smoking, and reviewed strategies to maximize success 

## 2015-06-10 ENCOUNTER — Telehealth: Payer: Self-pay | Admitting: *Deleted

## 2015-06-10 ENCOUNTER — Encounter: Payer: Self-pay | Admitting: *Deleted

## 2015-06-10 NOTE — Telephone Encounter (Signed)
Received referral for low dose lung cancer screening CT scan from Dr. Derrel Nip . Voicemail left at phone number listed in EMR (home) for patient to call me back to facilitate scheduling scan.

## 2015-06-12 ENCOUNTER — Encounter: Payer: Self-pay | Admitting: Family Medicine

## 2015-06-12 ENCOUNTER — Other Ambulatory Visit: Payer: Self-pay | Admitting: Family Medicine

## 2015-06-12 DIAGNOSIS — Z87891 Personal history of nicotine dependence: Secondary | ICD-10-CM

## 2015-06-12 HISTORY — DX: Personal history of nicotine dependence: Z87.891

## 2015-06-14 ENCOUNTER — Inpatient Hospital Stay: Payer: BLUE CROSS/BLUE SHIELD | Attending: Family Medicine | Admitting: Family Medicine

## 2015-06-14 ENCOUNTER — Encounter: Payer: Self-pay | Admitting: Family Medicine

## 2015-06-14 ENCOUNTER — Ambulatory Visit
Admission: RE | Admit: 2015-06-14 | Discharge: 2015-06-14 | Disposition: A | Discharge status: Home / Self Care | Payer: BLUE CROSS/BLUE SHIELD | Source: Ambulatory Visit | Attending: Family Medicine | Admitting: Family Medicine

## 2015-06-14 DIAGNOSIS — F17210 Nicotine dependence, cigarettes, uncomplicated: Secondary | ICD-10-CM | POA: Diagnosis not present

## 2015-06-14 DIAGNOSIS — R911 Solitary pulmonary nodule: Secondary | ICD-10-CM | POA: Diagnosis not present

## 2015-06-14 DIAGNOSIS — Z122 Encounter for screening for malignant neoplasm of respiratory organs: Secondary | ICD-10-CM | POA: Diagnosis not present

## 2015-06-14 DIAGNOSIS — Z87891 Personal history of nicotine dependence: Secondary | ICD-10-CM | POA: Insufficient documentation

## 2015-06-14 NOTE — Progress Notes (Signed)
In accordance with CMS guidelines, patient has meet eligibility criteria including age, absence of signs or symptoms of lung cancer, the specific calculation of cigarette smoking pack-years was 33.75 years and is a current smoker.   A shared decision-making session was conducted prior to the performance of CT scan. This includes one or more decision aids, includes benefits and harms of screening, follow-up diagnostic testing, over-diagnosis, false positive rate, and total radiation exposure.  Counseling on the importance of adherence to annual lung cancer LDCT screening, impact of co-morbidities, and ability or willingness to undergo diagnosis and treatment is imperative for compliance of the program.  Counseling on the importance of continued smoking cessation for former smokers; the importance of smoking cessation for current smokers and information about tobacco cessation interventions have been given to patient including the [place] [facility], 1800 quit [place], as well as Cancer Center specific smoking cessation programs.  Written order for lung cancer screening with LDCT has been given to the patient and any and all questions have been answered to the best of my abilities.   Yearly follow up will be scheduled by Shawn Perkins, Thoracic Navigator.   

## 2015-06-17 ENCOUNTER — Telehealth: Payer: Self-pay | Admitting: *Deleted

## 2015-06-17 NOTE — Telephone Encounter (Signed)
Notified patient of LDCT lung cancer screening results of Lung Rads 2 finding with recommendation for 12 month follow up imaging. Also notified of incidental finding noted below. Patient verbalizes understanding.   IMPRESSION: 4.9 mm ground-glass nodule in the right upper lobe. Lung-RADS Category 2, benign appearance or behavior. Continue annual screening with low-dose chest CT without contrast in 12 months.  Notified patient that Kimberly Schmitt will be contacted close to the time that annual scan is due for scheduling.

## 2015-06-25 ENCOUNTER — Ambulatory Visit
Admission: RE | Admit: 2015-06-25 | Discharge: 2015-06-25 | Disposition: A | Discharge status: Home / Self Care | Payer: BLUE CROSS/BLUE SHIELD | Source: Ambulatory Visit | Attending: Internal Medicine | Admitting: Internal Medicine

## 2015-06-25 DIAGNOSIS — Z1239 Encounter for other screening for malignant neoplasm of breast: Secondary | ICD-10-CM

## 2015-06-25 DIAGNOSIS — Z1231 Encounter for screening mammogram for malignant neoplasm of breast: Secondary | ICD-10-CM | POA: Insufficient documentation

## 2015-10-04 ENCOUNTER — Emergency Department
Admission: EM | Admit: 2015-10-04 | Discharge: 2015-10-04 | Disposition: A | Discharge status: Home / Self Care | Payer: BLUE CROSS/BLUE SHIELD | Attending: Emergency Medicine | Admitting: Emergency Medicine

## 2015-10-04 ENCOUNTER — Encounter: Payer: Self-pay | Admitting: Emergency Medicine

## 2015-10-04 DIAGNOSIS — Y999 Unspecified external cause status: Secondary | ICD-10-CM | POA: Insufficient documentation

## 2015-10-04 DIAGNOSIS — E785 Hyperlipidemia, unspecified: Secondary | ICD-10-CM | POA: Diagnosis not present

## 2015-10-04 DIAGNOSIS — S60562A Insect bite (nonvenomous) of left hand, initial encounter: Secondary | ICD-10-CM | POA: Diagnosis not present

## 2015-10-04 DIAGNOSIS — Y939 Activity, unspecified: Secondary | ICD-10-CM | POA: Diagnosis not present

## 2015-10-04 DIAGNOSIS — Y929 Unspecified place or not applicable: Secondary | ICD-10-CM | POA: Insufficient documentation

## 2015-10-04 DIAGNOSIS — W57XXXA Bitten or stung by nonvenomous insect and other nonvenomous arthropods, initial encounter: Secondary | ICD-10-CM | POA: Diagnosis not present

## 2015-10-04 DIAGNOSIS — M7989 Other specified soft tissue disorders: Secondary | ICD-10-CM

## 2015-10-04 DIAGNOSIS — F17210 Nicotine dependence, cigarettes, uncomplicated: Secondary | ICD-10-CM | POA: Diagnosis not present

## 2015-10-04 DIAGNOSIS — Z7982 Long term (current) use of aspirin: Secondary | ICD-10-CM | POA: Diagnosis not present

## 2015-10-04 MED ORDER — DEXAMETHASONE SODIUM PHOSPHATE 10 MG/ML IJ SOLN
10.0000 mg | Freq: Once | INTRAMUSCULAR | Status: AC
  Administered 2015-10-04: 10 mg via INTRAMUSCULAR

## 2015-10-04 MED ORDER — PREDNISONE 10 MG PO TABS: 10.0000 mg | ORAL_TABLET | Freq: Every day | ORAL | Status: DC

## 2015-10-04 MED ORDER — CEPHALEXIN 500 MG PO CAPS: 500.0000 mg | ORAL_CAPSULE | Freq: Three times a day (TID) | ORAL | Status: AC

## 2015-10-04 MED ORDER — DEXAMETHASONE SODIUM PHOSPHATE 10 MG/ML IJ SOLN
INTRAMUSCULAR | Status: AC
  Filled 2015-10-04: qty 1

## 2015-10-04 NOTE — ED Provider Notes (Signed)
CSN: [id number]     Arrival date & time 10/04/15  2215 History   First MD Initiated Contact with Patient 10/04/15 2228     Chief Complaint  Patient presents with  . Insect Bite     (Consider location/radiation/quality/duration/timing/severity/associated sxs/prior Treatment) HPI  65 year old female presents to emergency department for evaluation of left hand swelling. Patient states she was riding her lawn more, mowing the grass when she felt a bite to the dorsal aspect of the left hand. She noticed itching and swelling. Symptoms occurred at 4 PM. Over the last couple hours she's had increased swelling and itching. She denies any significant pain or fevers. Swelling is been stable since 6 PM. She has taken Benadryl without much improvement. She denies any numbness or tingling. She is able to make a fist without pain, only mild tightness throughout the MCP joints.  Past Medical History  Diagnosis Date  . Hyperlipidemia     220 per patient  . Personal history of tobacco use, presenting hazards to health 06/12/2015   Past Surgical History  Procedure Laterality Date  . Appendectomy  1968   Family History  Problem Relation Age of Onset  . Cancer Father 29    colon or  lung CA  . Diabetes Sister   . Cancer Brother 62    Pancreatic and Liver cancer  . Stroke Maternal Aunt   . Cancer Maternal Grandmother   . Breast cancer Maternal Grandmother 85   Social History  Substance Use Topics  . Smoking status: Current Every Day Smoker -- 0.50 packs/day for 45 years    Types: Cigarettes  . Smokeless tobacco: Never Used  . Alcohol Use: No   OB History    No data available     Review of Systems  Constitutional: Negative for fever, chills, activity change and fatigue.  HENT: Negative for congestion, sinus pressure and sore throat.   Eyes: Negative for visual disturbance.  Respiratory: Negative for cough, chest tightness and shortness of breath.   Cardiovascular: Negative for chest pain and  leg swelling.  Gastrointestinal: Negative for nausea, vomiting, abdominal pain and diarrhea.  Genitourinary: Negative for dysuria.  Musculoskeletal: Negative for arthralgias and gait problem.  Skin: Positive for rash and wound.  Neurological: Negative for weakness, numbness and headaches.  Hematological: Negative for adenopathy.  Psychiatric/Behavioral: Negative for behavioral problems, confusion and agitation.      Allergies  Review of patient's allergies indicates no known allergies.  Home Medications   Prior to Admission medications   Medication Sig Start Date End Date Taking? Authorizing Provider  aspirin 81 MG tablet Take 81 mg by mouth daily.    Historical Provider, MD  cephALEXin (KEFLEX) 500 MG capsule Take 1 capsule (500 mg total) by mouth 3 (three) times daily. 7 days 10/04/15 10/14/15  Duanne Guess, PA-C  predniSONE (DELTASONE) 10 MG tablet Take 1 tablet (10 mg total) by mouth daily. 6,5,4,3,2,1 six day taper 10/04/15   Duanne Guess, PA-C  Probiotic Product (PROBIOTIC DAILY PO) Take by mouth.    Historical Provider, MD   BP 168/91 mmHg  Pulse 92  Temp(Src) 98.2 F (36.8 C) (Oral)  Resp 16  Ht 4\' 11"  (1.499 m)  Wt 52.164 kg  BMI 23.21 kg/m2  SpO2 98% Physical Exam  Constitutional: She is oriented to person, place, and time. She appears well-developed and well-nourished. No distress.  HENT:  Head: Normocephalic and atraumatic.  Mouth/Throat: Oropharynx is clear and moist.  Eyes: EOM are normal. Pupils  are equal, round, and reactive to light. Right eye exhibits no discharge. Left eye exhibits no discharge.  Neck: Normal range of motion. Neck supple.  Cardiovascular: Normal rate, regular rhythm and intact distal pulses.   Pulmonary/Chest: Effort normal and breath sounds normal. No respiratory distress.  Abdominal: Soft. She exhibits no distension. There is no tenderness.  Musculoskeletal:  Examination of the left hand shows the patient has full composite fist.  There is mild to moderate swelling of the soft tissues along the dorsum of the left hand throughout the MCP joints. There is no significant warmth or erythema. Along the third MCP joint there is mild erythema with no fluctuance. There is no skin lesion identified. No signs of any drainage. Patient is nontender to palpation. She has full range of motion of the wrist and digits.  Neurological: She is alert and oriented to person, place, and time. She has normal reflexes.  Skin: Skin is warm and dry.  Psychiatric: She has a normal mood and affect. Her behavior is normal. Thought content normal.    ED Course  Procedures (including critical care time) Labs Review Labs Reviewed - No data to display  Imaging Review No results found. I have personally reviewed and evaluated these images and lab results as part of my medical decision-making.   EKG Interpretation None      MDM   Final diagnoses:  Insect bite of left hand, initial encounter  Swelling of left hand   65 year old female with insect bite to the left hand, developed left hand swelling. Swelling consistent with inflammation. No signs of cellulitis or infection. She is given 10 mg of dexamethasone IM. She is started on a 6 day prednisone taper. She'll continue Benadryl as needed. Recommend ice and elevation. If no improvement and 24 hours, would recommend starting Keflex. Patient understands instructions for discharge. She also understands signs and symptoms to return to the emergency department for.    Duanne Guess, PA-C 10/04/15 2346  Orbie Pyo, MD 10/05/15 [id number]

## 2015-10-04 NOTE — Discharge Instructions (Signed)
Insect Bite Mosquitoes, flies, fleas, bedbugs, and many other insects can bite. Insect bites are different from insect stings. A sting is when poison (venom) is injected into the skin. Insect bites can cause pain or itching for a few days, but they are usually not serious. Some insects can spread diseases to people through a bite. SYMPTOMS  Symptoms of an insect bite include:  Itching or pain in the bite area.  Redness and swelling in the bite area.  An open wound (skin ulcer). In many cases, symptoms last for 2-4 days.  DIAGNOSIS  This condition is usually diagnosed based on symptoms and a physical exam. TREATMENT  Treatment is usually not needed for an insect bite. Symptoms often go away on their own. Your health care provider may recommend creams or lotions to help reduce itching. Antibiotic medicines may be prescribed if the bite becomes infected. A tetanus shot may be given in some cases. If you develop an allergic reaction to an insect bite, your health care provider will prescribe medicines to treat the reaction (antihistamines). This is rare. HOME CARE INSTRUCTIONS  Do not scratch the bite area.  Keep the bite area clean and dry. Wash the bite area daily with soap and water as told by your health care provider.  If directed, applyice to the bite area.  Put ice in a plastic bag.  Place a towel between your skin and the bag.  Leave the ice on for 20 minutes, 2-3 times per day.  To help reduce itching and swelling, try applying a baking soda paste, cortisone cream, or calamine lotion to the bite area as told by your health care provider.  Apply or take over-the-counter and prescription medicines only as told by your health care provider.  If you were prescribed an antibiotic medicine, use it as told by your health care provider. Do not stop using the antibiotic even if your condition improves.  Keep all follow-up visits as told by your health care provider. This is  important. PREVENTION   Use insect repellent. The best insect repellents contain:  DEET, picaridin, oil of lemon eucalyptus (OLE), or IR3535.  Higher amounts of an active ingredient.  When you are outdoors, wear clothing that covers your arms and legs.  Avoid opening windows that do not have window screens. SEEK MEDICAL CARE IF:  You have increased redness, swelling, or pain in the bite area.  You have a fever. SEEK IMMEDIATE MEDICAL CARE IF:   You have joint pain.   You have fluid, blood, or pus coming from the bite area.  You have a headache or neck pain.  You have unusual weakness.  You have a rash.  You have chest pain or shortness of breath.  You have abdominal pain, nausea, or vomiting.  You feel unusually tired or sleepy.   This information is not intended to replace advice given to you by your health care provider. Make sure you discuss any questions you have with your health care provider.   Document Released: 06/04/2004 Document Revised: 01/16/2015 Document Reviewed: 09/12/2014 Elsevier Interactive Patient Education 2016 [employer]  Edema Edema is an abnormal buildup of fluids. It is more common in your legs and thighs. Painless swelling of the feet and ankles is more likely as a person ages. It also is common in looser skin, like around your eyes. HOME CARE   Keep the affected body part above the level of the heart while lying down.  Do not sit still or stand  for a long time.  Do not put anything right under your knees when you lie down.  Do not wear tight clothes on your upper legs.  Exercise your legs to help the puffiness (swelling) go down.  Wear elastic bandages or support stockings as told by your doctor.  A low-salt diet may help lessen the puffiness.  Only take medicine as told by your doctor. GET HELP IF:  Treatment is not working.  You have heart, liver, or kidney disease and notice that your skin looks puffy or shiny.  You  have puffiness in your legs that does not get better when you raise your legs.  You have sudden weight gain for no reason. GET HELP RIGHT AWAY IF:   You have shortness of breath or chest pain.  You cannot breathe when you lie down.  You have pain, redness, or warmth in the areas that are puffy.  You have heart, liver, or kidney disease and get edema all of a sudden.  You have a fever and your symptoms get worse all of a sudden. MAKE SURE YOU:   Understand these instructions.  Will watch your condition.  Will get help right away if you are not doing well or get worse.   This information is not intended to replace advice given to you by your health care provider. Make sure you discuss any questions you have with your health care provider.   Document Released: 10/14/2007 Document Revised: 05/02/2013 Document Reviewed: 02/17/2013 Elsevier Interactive Patient Education 2016 [employer] Inc. Please take prednisone as prescribed. Continue Benadryl. If no improvement in 24 hours, start Keflex. Return to the ED for any worsening swelling, pain, fevers or for any urgent changes in her health.

## 2015-10-04 NOTE — ED Notes (Signed)
Pt comes into the ED via POV c/o insect bite to the left hand with swelling present.  Patient took 4 benadryl and cortisone cream at home with no relief.

## 2015-10-10 NOTE — Telephone Encounter (Signed)
error 

## 2016-04-24 LAB — HM COLONOSCOPY

## 2016-05-13 ENCOUNTER — Encounter: Payer: Self-pay | Admitting: Internal Medicine

## 2016-05-13 ENCOUNTER — Ambulatory Visit (INDEPENDENT_AMBULATORY_CARE_PROVIDER_SITE_OTHER): Payer: BLUE CROSS/BLUE SHIELD | Admitting: Internal Medicine

## 2016-05-13 VITALS — BP 116/66 | HR 81 | Temp 97.4°F | Resp 16 | Ht 59.5 in | Wt 111.5 lb

## 2016-05-13 DIAGNOSIS — I2510 Atherosclerotic heart disease of native coronary artery without angina pectoris: Secondary | ICD-10-CM | POA: Diagnosis not present

## 2016-05-13 DIAGNOSIS — E559 Vitamin D deficiency, unspecified: Secondary | ICD-10-CM

## 2016-05-13 DIAGNOSIS — Z79899 Other long term (current) drug therapy: Secondary | ICD-10-CM

## 2016-05-13 DIAGNOSIS — Z0000 Encounter for general adult medical examination without abnormal findings: Secondary | ICD-10-CM | POA: Diagnosis not present

## 2016-05-13 DIAGNOSIS — Z1239 Encounter for other screening for malignant neoplasm of breast: Secondary | ICD-10-CM

## 2016-05-13 DIAGNOSIS — E7800 Pure hypercholesterolemia, unspecified: Secondary | ICD-10-CM | POA: Diagnosis not present

## 2016-05-13 DIAGNOSIS — R5383 Other fatigue: Secondary | ICD-10-CM | POA: Diagnosis not present

## 2016-05-13 DIAGNOSIS — Z1231 Encounter for screening mammogram for malignant neoplasm of breast: Secondary | ICD-10-CM | POA: Diagnosis not present

## 2016-05-13 DIAGNOSIS — R911 Solitary pulmonary nodule: Secondary | ICD-10-CM | POA: Diagnosis not present

## 2016-05-13 DIAGNOSIS — Z716 Tobacco abuse counseling: Secondary | ICD-10-CM | POA: Diagnosis not present

## 2016-05-13 DIAGNOSIS — Z720 Tobacco use: Secondary | ICD-10-CM

## 2016-05-13 LAB — LIPID PANEL
CHOL/HDL RATIO: 3
Cholesterol: 245 mg/dL — ABNORMAL HIGH (ref 0–200)
HDL: 78.1 mg/dL (ref >39.00)
LDL Cholesterol: 151 mg/dL — ABNORMAL HIGH (ref 0–99)
NONHDL: 167.22
Triglycerides: 79 mg/dL (ref 0.0–149.0)
VLDL: 15.8 mg/dL (ref 0.0–40.0)

## 2016-05-13 LAB — COMPREHENSIVE METABOLIC PANEL
ALK PHOS: 54 U/L (ref 39–117)
ALT: 16 U/L (ref 0–35)
AST: 17 U/L (ref 0–37)
Albumin: 5 g/dL (ref 3.5–5.2)
BUN: 20 mg/dL (ref 6–23)
CHLORIDE: 105 meq/L (ref 96–112)
CO2: 29 mEq/L (ref 19–32)
Calcium: 10 mg/dL (ref 8.4–10.5)
Creatinine, Ser: 0.72 mg/dL (ref 0.40–1.20)
GFR: 85.8 mL/min (ref >60.00)
GLUCOSE: 87 mg/dL (ref 70–99)
POTASSIUM: 5 meq/L (ref 3.5–5.1)
SODIUM: 142 meq/L (ref 135–145)
TOTAL PROTEIN: 7 g/dL (ref 6.0–8.3)
Total Bilirubin: 0.4 mg/dL (ref 0.2–1.2)

## 2016-05-13 LAB — CBC WITH DIFFERENTIAL/PLATELET
Basophils Absolute: 0.1 10*3/uL (ref 0.0–0.1)
Basophils Relative: 0.6 % (ref 0.0–3.0)
Eosinophils Absolute: 0.1 10*3/uL (ref 0.0–0.7)
Eosinophils Relative: 0.5 % (ref 0.0–5.0)
HEMATOCRIT: 45 % (ref 36.0–46.0)
Hemoglobin: 15 g/dL (ref 12.0–15.0)
LYMPHS PCT: 22.5 % (ref 12.0–46.0)
Lymphs Abs: 2.4 10*3/uL (ref 0.7–4.0)
MCHC: 33.3 g/dL (ref 30.0–36.0)
MCV: 91.9 fl (ref 78.0–100.0)
MONOS PCT: 4.7 % (ref 3.0–12.0)
Monocytes Absolute: 0.5 10*3/uL (ref 0.1–1.0)
NEUTROS ABS: 7.6 10*3/uL (ref 1.4–7.7)
Neutrophils Relative %: 71.7 % (ref 43.0–77.0)
PLATELETS: 350 10*3/uL (ref 150.0–400.0)
RBC: 4.9 Mil/uL (ref 3.87–5.11)
RDW: 15.1 % (ref 11.5–15.5)
WBC: 10.6 10*3/uL — ABNORMAL HIGH (ref 4.0–10.5)

## 2016-05-13 LAB — VITAMIN D 25 HYDROXY (VIT D DEFICIENCY, FRACTURES): VITD: 30.34 ng/mL (ref 30.00–100.00)

## 2016-05-13 LAB — TSH: TSH: 1.13 u[IU]/mL (ref 0.35–4.50)

## 2016-05-13 MED ORDER — PRAVASTATIN SODIUM 20 MG PO TABS: 20.0000 mg | ORAL_TABLET | Freq: Every day | ORAL | 0 refills | Status: DC

## 2016-05-13 NOTE — Progress Notes (Signed)
Pre-visit discussion using our clinic review tool. No additional management support is needed unless otherwise documented below in the visit note.  

## 2016-05-13 NOTE — Progress Notes (Signed)
Patient ID: Kimberly Schmitt, female    DOB: 01-31-1951  Age: 66 y.o. MRN: [id number]  The patient is here for annual physical examination and management of other chronic and acute problems.  Mammogram feb 2017 normal  Diagnostic Colonoscopy Nov 2017 3 polyps found,   5 yr follow up per patient  Dermatology in march for history of benign skin lesion      The risk factors are reflected in the social history.  Working full time at [employer],  30 years,  Data processing manager.    The roster of all physicians providing medical care to patient - is listed in the Snapshot section of the chart.  Activities of daily living:  The patient is 100% independent in all ADLs: dressing, toileting, feeding as well as independent mobility  Home safety : The patient has smoke detectors in the home. They wear seatbelts.  There are no firearms at home. There is no violence in the home.   There is no risks for hepatitis, STDs or HIV. There is no   history of blood transfusion. They have no travel history to infectious disease endemic areas of the world.  The patient has seen their dentist in the last six month. They have seen their eye doctor in the last year. They admit to slight hearing difficulty with regard to whispered voices and some television programs.  They have deferred audiologic testing in the last year.  They do not  have excessive sun exposure. Discussed the need for sun protection: hats, long sleeves and use of sunscreen if there is significant sun exposure.   Diet: the importance of a healthy diet is discussed. They do have a healthy diet.  The benefits of regular aerobic exercise were discussed. She walks 4 times per week ,  20 minutes.   Depression screen: there are no signs or vegative symptoms of depression- irritability, change in appetite, anhedonia, sadness/tearfullness.  Cognitive assessment: the patient manages all their financial and personal affairs and is actively engaged. They could  relate day,date,year and events; recalled 2/3 objects at 3 minutes; performed clock-face test normally.  The following portions of the patient's history were reviewed and updated as appropriate: allergies, current medications, past family history, past medical history,  past surgical history, past social history  and problem list.  Visual acuity was not assessed per patient preference since she has regular follow up with her ophthalmologist. Hearing and body mass index were assessed and reviewed.   During the course of the visit the patient was educated and counseled about appropriate screening and preventive services including : fall prevention , diabetes screening, nutrition counseling, colorectal cancer screening, and recommended immunizations.    CC: The primary encounter diagnosis was Breast cancer screening. Diagnoses of Atherosclerosis of native coronary artery of native heart without angina pectoris, Vitamin D deficiency, Fatigue, unspecified type, Solitary pulmonary nodule, Pure hypercholesterolemia, Tobacco abuse, Tobacco abuse counseling, Visit for preventive health examination, and Long-term use of high-risk medication were also pertinent to this visit.  Hyperlipidemia: reviewed Fasting lipids jan 2017  LDL 121 HDL 75.  Cardiac risk factors include tobacco abuse,  CAD seen on low dose chest CT for lung ca screening ; calcification of LAD and aortic arch   Pulmonary nodule: 5 mm nodule rul benign appearing,  Seen on lung CT: repeat imaging needed in 12 months   [name] has a past medical history of Hyperlipidemia and Personal history of tobacco use, presenting hazards to health (06/12/2015).   She has  a past surgical history that includes Appendectomy (1968).   Her family history includes Breast cancer (age of onset: 57) in her maternal grandmother; Cancer in her maternal grandmother; Cancer (age of onset: 48) in her brother; Cancer (age of onset: 61) in her father; Diabetes in  her sister; Stroke in her maternal aunt.She reports that she has been smoking Cigarettes.  She has a 22.50 pack-year smoking history. She has never used smokeless tobacco. She reports that she does not drink alcohol or use drugs.  Outpatient Medications Prior to Visit  Medication Sig Dispense Refill  . aspirin 81 MG tablet Take 81 mg by mouth daily.    . Probiotic Product (PROBIOTIC DAILY PO) Take by mouth.    . predniSONE (DELTASONE) 10 MG tablet Take 1 tablet (10 mg total) by mouth daily. 6,5,4,3,2,1 six day taper 21 tablet 0   No facility-administered medications prior to visit.     Review of Systems   Patient denies headache, fevers, malaise, unintentional weight loss, skin rash, eye pain, sinus congestion and sinus pain, sore throat, dysphagia,  hemoptysis , cough, dyspnea, wheezing, chest pain, palpitations, orthopnea, edema, abdominal pain, nausea, melena, diarrhea, constipation, flank pain, dysuria, hematuria, urinary  Frequency, nocturia, numbness, tingling, seizures,  Focal weakness, Loss of consciousness,  Tremor, insomnia, depression, anxiety, and suicidal ideation.      Objective:  BP 116/66   Pulse 81   Temp 97.4 F (36.3 C) (Oral)   Resp 16   Ht 4' 11.5" (1.511 m)   Wt 111 lb 8 oz (50.6 kg)   SpO2 97%   BMI 22.14 kg/m   Physical Exam  General appearance: alert, cooperative and appears stated age Head: Normocephalic, without obvious abnormality, atraumatic Eyes: conjunctivae/corneas clear. PERRL, EOM's intact. Fundi benign. Ears: normal TM's and external ear canals both ears Nose: Nares normal. Septum midline. Mucosa normal. No drainage or sinus tenderness. Throat: lips, mucosa, and tongue normal; teeth and gums normal Neck: no adenopathy, no carotid bruit, no JVD, supple, symmetrical, trachea midline and thyroid not enlarged, symmetric, no tenderness/mass/nodules Lungs: clear to auscultation bilaterally Breasts: normal appearance, no masses or tenderness Heart:  regular rate and rhythm, S1, S2 normal, no murmur, click, rub or gallop Abdomen: soft, non-tender; bowel sounds normal; no masses,  no organomegaly Extremities: extremities normal, atraumatic, no cyanosis or edema Pulses: 2+ and symmetric Skin: Skin color, texture, turgor normal. No rashes or lesions Neurologic: Alert and oriented X 3, normal strength and tone. Normal symmetric reflexes. Normal coordination and gait.     Assessment & Plan:   Problem List Items Addressed This Visit    Atherosclerosis of native coronary artery of native heart without angina pectoris    Reviewed the significance of the findings on cardiac CT, despite a relatively normal lipid profile,  and the role that tobacco cessation and statin therapy play in stabilizing placque      Relevant Medications   pravastatin (PRAVACHOL) 20 MG tablet   Other Relevant Orders   Comprehensive metabolic panel (Completed)   Lipid panel (Completed)   Hyperlipidemia    Using the Framingham risk calculator,  her 10 year risk of coronary artery disease is11%. Strongly recommending use of pravastatin  And aspirin given presence of atherosclerosis on CT.  Tobacco cessation strongly recommended.       Relevant Medications   pravastatin (PRAVACHOL) 20 MG tablet   Solitary pulmonary nodule   Relevant Orders   CT CHEST LUNG CA SCREEN LOW DOSE W/O CM   Tobacco abuse  Smoking cessation instruction/counseling given: commended patient for reducing daily use and encouraged  Patient to continue reduction in daily use by 1 cigarette every week      Tobacco abuse counseling    Smoking cessation instruction/counseling given:  counseled patient on the dangers of tobacco use, advised patient to stop smoking, and reviewed strategies to maximize success      Visit for preventive health examination    Annual comprehensive preventive exam was done as well as an evaluation and management of chronic conditions .  During the course of the visit the  patient was educated and counseled about appropriate screening and preventive services including :  diabetes screening, lipid analysis with projected  10 year  risk for CAD , nutrition counseling, breast, cervical and colorectal cancer screening, and recommended immunizations.  Printed recommendations for health maintenance screenings was given      Vitamin D deficiency    Borderline,  Recommended in increase n supplementation to 2000 IUs daily for the winter months       Relevant Orders   VITAMIN D 25 Hydroxy (Vit-D Deficiency, Fractures) (Completed)    Other Visit Diagnoses    Breast cancer screening    -  Primary   Relevant Orders   MM SCREENING BREAST TOMO BILATERAL   Fatigue, unspecified type       Relevant Orders   TSH (Completed)   CBC with Differential/Platelet (Completed)   Long-term use of high-risk medication       Relevant Orders   Hepatic function panel      I have discontinued Ms. Traister's predniSONE. I am also having her start on pravastatin. Additionally, I am having her maintain her aspirin and Probiotic Product (PROBIOTIC DAILY PO).  Meds ordered this encounter  Medications  . pravastatin (PRAVACHOL) 20 MG tablet    Sig: Take 1 tablet (20 mg total) by mouth daily.    Dispense:  90 tablet    Refill:  0    Medications Discontinued During This Encounter  Medication Reason  . predniSONE (DELTASONE) 10 MG tablet No longer needed (for PRN medications)    Follow-up: No Follow-up on file.   Crecencio Mc, MD

## 2016-05-13 NOTE — Patient Instructions (Addendum)
I have recommended a trial of pravastatin to stabilize the placque that was seen in your coronary artery and aorta.  This placuqe increases your risk for heart attack and stroke.    Please return in 6 weeks for repeat blood tests. (fasting preferably)  Continue to take 81 mg aspirin daily as well for prevention for stroke and heart attack  I 'm glad you are tryin to quit smoking  We will repeat your CT lung scan and your mammogram in February    Health Maintenance for Postmenopausal Women Introduction Menopause is a normal process in which your reproductive ability comes to an end. This process happens gradually over a span of months to years, usually between the ages of 55 and 38. Menopause is complete when you have missed 12 consecutive menstrual periods. It is important to talk with your health care provider about some of the most common conditions that affect postmenopausal women, such as heart disease, cancer, and bone loss (osteoporosis). Adopting a healthy lifestyle and getting preventive care can help to promote your health and wellness. Those actions can also lower your chances of developing some of these common conditions. What should I know about menopause? During menopause, you may experience a number of symptoms, such as:  Moderate-to-severe hot flashes.  Night sweats.  Decrease in sex drive.  Mood swings.  Headaches.  Tiredness.  Irritability.  Memory problems.  Insomnia. Choosing to treat or not to treat menopausal changes is an individual decision that you make with your health care provider. What should I know about hormone replacement therapy and supplements? Hormone therapy products are effective for treating symptoms that are associated with menopause, such as hot flashes and night sweats. Hormone replacement carries certain risks, especially as you become older. If you are thinking about using estrogen or estrogen with progestin treatments, discuss the  benefits and risks with your health care provider. What should I know about heart disease and stroke? Heart disease, heart attack, and stroke become more likely as you age. This may be due, in part, to the hormonal changes that your body experiences during menopause. These can affect how your body processes dietary fats, triglycerides, and cholesterol. Heart attack and stroke are both medical emergencies. There are many things that you can do to help prevent heart disease and stroke:  Have your blood pressure checked at least every 1-2 years. High blood pressure causes heart disease and increases the risk of stroke.  If you are 65-14 years old, ask your health care provider if you should take aspirin to prevent a heart attack or a stroke.  Do not use any tobacco products, including cigarettes, chewing tobacco, or electronic cigarettes. If you need help quitting, ask your health care provider.  It is important to eat a healthy diet and maintain a healthy weight.  Be sure to include plenty of vegetables, fruits, low-fat dairy products, and lean protein.  Avoid eating foods that are high in solid fats, added sugars, or salt (sodium).  Get regular exercise. This is one of the most important things that you can do for your health.  Try to exercise for at least 150 minutes each week. The type of exercise that you do should increase your heart rate and make you sweat. This is known as moderate-intensity exercise.  Try to do strengthening exercises at least twice each week. Do these in addition to the moderate-intensity exercise.  Know your numbers.Ask your health care provider to check your cholesterol and your blood glucose.  Continue to have your blood tested as directed by your health care provider. What should I know about cancer screening? There are several types of cancer. Take the following steps to reduce your risk and to catch any cancer development as early as possible. Breast  Cancer  Practice breast self-awareness.  This means understanding how your breasts normally appear and feel.  It also means doing regular breast self-exams. Let your health care provider know about any changes, no matter how small.  If you are 3 or older, have a clinician do a breast exam (clinical breast exam or CBE) every year. Depending on your age, family history, and medical history, it may be recommended that you also have a yearly breast X-ray (mammogram).  If you have a family history of breast cancer, talk with your health care provider about genetic screening.  If you are at high risk for breast cancer, talk with your health care provider about having an MRI and a mammogram every year.  Breast cancer (BRCA) gene test is recommended for women who have family members with BRCA-related cancers. Results of the assessment will determine the need for genetic counseling and BRCA1 and for BRCA2 testing. BRCA-related cancers include these types:  Breast. This occurs in males or females.  Ovarian.  Tubal. This may also be called fallopian tube cancer.  Cancer of the abdominal or pelvic lining (peritoneal cancer).  Prostate.  Pancreatic. Cervical, Uterine, and Ovarian Cancer  Your health care provider may recommend that you be screened regularly for cancer of the pelvic organs. These include your ovaries, uterus, and vagina. This screening involves a pelvic exam, which includes checking for microscopic changes to the surface of your cervix (Pap test).  For women ages 21-65, health care providers may recommend a pelvic exam and a Pap test every three years. For women ages 54-65, they may recommend the Pap test and pelvic exam, combined with testing for human papilloma virus (HPV), every five years. Some types of HPV increase your risk of cervical cancer. Testing for HPV may also be done on women of any age who have unclear Pap test results.  Other health care providers may not  recommend any screening for nonpregnant women who are considered low risk for pelvic cancer and have no symptoms. Ask your health care provider if a screening pelvic exam is right for you.  If you have had past treatment for cervical cancer or a condition that could lead to cancer, you need Pap tests and screening for cancer for at least 20 years after your treatment. If Pap tests have been discontinued for you, your risk factors (such as having a new sexual partner) need to be reassessed to determine if you should start having screenings again. Some women have medical problems that increase the chance of getting cervical cancer. In these cases, your health care provider may recommend that you have screening and Pap tests more often.  If you have a family history of uterine cancer or ovarian cancer, talk with your health care provider about genetic screening.  If you have vaginal bleeding after reaching menopause, tell your health care provider.  There are currently no reliable tests available to screen for ovarian cancer. Lung Cancer  Lung cancer screening is recommended for adults 37-35 years old who are at high risk for lung cancer because of a history of smoking. A yearly low-dose CT scan of the lungs is recommended if you:  Currently smoke.  Have a history of at least 30  pack-years of smoking and you currently smoke or have quit within the past 15 years. A pack-year is smoking an average of one pack of cigarettes per day for one year. Yearly screening should:  Continue until it has been 15 years since you quit.  Stop if you develop a health problem that would prevent you from having lung cancer treatment. Colorectal Cancer  This type of cancer can be detected and can often be prevented.  Routine colorectal cancer screening usually begins at age 51 and continues through age 64.  If you have risk factors for colon cancer, your health care provider may recommend that you be screened at an  earlier age.  If you have a family history of colorectal cancer, talk with your health care provider about genetic screening.  Your health care provider may also recommend using home test kits to check for hidden blood in your stool.  A small camera at the end of a tube can be used to examine your colon directly (sigmoidoscopy or colonoscopy). This is done to check for the earliest forms of colorectal cancer.  Direct examination of the colon should be repeated every 5-10 years until age 45. However, if early forms of precancerous polyps or small growths are found or if you have a family history or genetic risk for colorectal cancer, you may need to be screened more often. Skin Cancer  Check your skin from head to toe regularly.  Monitor any moles. Be sure to tell your health care provider:  About any new moles or changes in moles, especially if there is a change in a mole's shape or color.  If you have a mole that is larger than the size of a pencil eraser.  If any of your family members has a history of skin cancer, especially at a young age, talk with your health care provider about genetic screening.  Always use sunscreen. Apply sunscreen liberally and repeatedly throughout the day.  Whenever you are outside, protect yourself by wearing long sleeves, pants, a wide-brimmed hat, and sunglasses. What should I know about osteoporosis? Osteoporosis is a condition in which bone destruction happens more quickly than new bone creation. After menopause, you may be at an increased risk for osteoporosis. To help prevent osteoporosis or the bone fractures that can happen because of osteoporosis, the following is recommended:  If you are 33-8 years old, get at least 1,000 mg of calcium and at least 600 mg of vitamin D per day.  If you are older than age 62 but younger than age 63, get at least 1,200 mg of calcium and at least 600 mg of vitamin D per day.  If you are older than age 57, get at  least 1,200 mg of calcium and at least 800 mg of vitamin D per day. Smoking and excessive alcohol intake increase the risk of osteoporosis. Eat foods that are rich in calcium and vitamin D, and do weight-bearing exercises several times each week as directed by your health care provider. What should I know about how menopause affects my mental health? Depression may occur at any age, but it is more common as you become older. Common symptoms of depression include:  Low or sad mood.  Changes in sleep patterns.  Changes in appetite or eating patterns.  Feeling an overall lack of motivation or enjoyment of activities that you previously enjoyed.  Frequent crying spells. Talk with your health care provider if you think that you are experiencing depression. What should  I know about immunizations? It is important that you get and maintain your immunizations. These include:  Tetanus, diphtheria, and pertussis (Tdap) booster vaccine.  Influenza every year before the flu season begins.  Pneumonia vaccine.  Shingles vaccine. Your health care provider may also recommend other immunizations. This information is not intended to replace advice given to you by your health care provider. Make sure you discuss any questions you have with your health care provider. Document Released: 06/19/2005 Document Revised: 11/15/2015 Document Reviewed: 01/29/2015  2017 Elsevier

## 2016-05-16 DIAGNOSIS — I2510 Atherosclerotic heart disease of native coronary artery without angina pectoris: Secondary | ICD-10-CM | POA: Insufficient documentation

## 2016-05-16 DIAGNOSIS — R911 Solitary pulmonary nodule: Secondary | ICD-10-CM | POA: Insufficient documentation

## 2016-05-16 DIAGNOSIS — E559 Vitamin D deficiency, unspecified: Secondary | ICD-10-CM | POA: Insufficient documentation

## 2016-05-16 NOTE — Assessment & Plan Note (Signed)
Borderline,  Recommended in increase n supplementation to 2000 IUs daily for the winter months

## 2016-05-16 NOTE — Assessment & Plan Note (Signed)
Annual comprehensive preventive exam was done as well as an evaluation and management of chronic conditions .  During the course of the visit the patient was educated and counseled about appropriate screening and preventive services including :  diabetes screening, lipid analysis with projected  10 year  risk for CAD , nutrition counseling, breast, cervical and colorectal cancer screening, and recommended immunizations.  Printed recommendations for health maintenance screenings was given 

## 2016-05-16 NOTE — Assessment & Plan Note (Signed)
Smoking cessation instruction/counseling given: commended patient for reducing daily use and encouraged  Patient to continue reduction in daily use by 1 cigarette every week

## 2016-05-16 NOTE — Assessment & Plan Note (Signed)
Smoking cessation instruction/counseling given:  counseled patient on the dangers of tobacco use, advised patient to stop smoking, and reviewed strategies to maximize success 

## 2016-05-16 NOTE — Assessment & Plan Note (Addendum)
Using the Framingham risk calculator,  her 10 year risk of coronary artery disease is11%. Strongly recommending use of pravastatin  And aspirin given presence of atherosclerosis on CT.  Tobacco cessation strongly recommended.

## 2016-05-16 NOTE — Assessment & Plan Note (Signed)
Reviewed the significance of the findings on cardiac CT, despite a relatively normal lipid profile,  and the role that tobacco cessation and statin therapy play in stabilizing placque

## 2016-05-18 ENCOUNTER — Other Ambulatory Visit: Payer: Self-pay | Admitting: Internal Medicine

## 2016-05-18 MED ORDER — ATORVASTATIN CALCIUM 20 MG PO TABS: 20.0000 mg | ORAL_TABLET | Freq: Every day | ORAL | 3 refills | Status: DC

## 2016-05-20 ENCOUNTER — Telehealth: Payer: Self-pay

## 2016-05-20 NOTE — Telephone Encounter (Signed)
Patient must stop her Pravastatin and start Atorvastatin

## 2016-05-21 NOTE — Progress Notes (Signed)
Letter sent to patient.

## 2016-06-02 ENCOUNTER — Telehealth: Payer: Self-pay | Admitting: *Deleted

## 2016-06-02 ENCOUNTER — Encounter: Payer: Self-pay | Admitting: Internal Medicine

## 2016-06-02 NOTE — Telephone Encounter (Signed)
Left voicemail for patient notifyng them that it is time to schedule annual low dose lung cancer screening CT scan. Instructed patient to call back to verify information prior to the scan being scheduled.  

## 2016-06-03 ENCOUNTER — Telehealth: Payer: Self-pay | Admitting: *Deleted

## 2016-06-03 DIAGNOSIS — Z87891 Personal history of nicotine dependence: Secondary | ICD-10-CM

## 2016-06-03 NOTE — Telephone Encounter (Signed)
Patient returned call. Notified patient that annual lung cancer screening low dose CT scan is due. Confirmed that patient is within the age range of 55-77, and asymptomatic, (no signs or symptoms of lung cancer). Patient denies illness that would prevent curative treatment for lung cancer if found. The patient is a current smoker, with a 34.25 pack year history. The shared decision making visit was done 06/14/15. Patient is agreeable for CT scan being scheduled.

## 2016-06-17 ENCOUNTER — Ambulatory Visit
Admission: RE | Admit: 2016-06-17 | Discharge: 2016-06-17 | Disposition: A | Discharge status: Home / Self Care | Payer: BLUE CROSS/BLUE SHIELD | Source: Ambulatory Visit | Attending: Oncology | Admitting: Oncology

## 2016-06-17 DIAGNOSIS — J432 Centrilobular emphysema: Secondary | ICD-10-CM | POA: Diagnosis not present

## 2016-06-17 DIAGNOSIS — I700 Atherosclerosis of aorta: Secondary | ICD-10-CM | POA: Insufficient documentation

## 2016-06-17 DIAGNOSIS — Z87891 Personal history of nicotine dependence: Secondary | ICD-10-CM | POA: Insufficient documentation

## 2016-06-17 DIAGNOSIS — I2510 Atherosclerotic heart disease of native coronary artery without angina pectoris: Secondary | ICD-10-CM | POA: Insufficient documentation

## 2016-06-22 ENCOUNTER — Telehealth: Payer: Self-pay | Admitting: Radiology

## 2016-06-22 DIAGNOSIS — R5383 Other fatigue: Secondary | ICD-10-CM

## 2016-06-22 DIAGNOSIS — E7800 Pure hypercholesterolemia, unspecified: Secondary | ICD-10-CM

## 2016-06-22 NOTE — Addendum Note (Signed)
Addended by: Crecencio Mc on: 06/22/2016 12:08 PM   Modules accepted: Orders

## 2016-06-22 NOTE — Telephone Encounter (Signed)
PT coming for labs Wednesday, note left on desk to add Hep C, please place order if lab needs to drawn. Thank you.

## 2016-06-22 NOTE — Telephone Encounter (Signed)
Fasting labs and hep c ordered

## 2016-06-24 ENCOUNTER — Other Ambulatory Visit (INDEPENDENT_AMBULATORY_CARE_PROVIDER_SITE_OTHER): Payer: BLUE CROSS/BLUE SHIELD

## 2016-06-24 DIAGNOSIS — R5383 Other fatigue: Secondary | ICD-10-CM

## 2016-06-24 DIAGNOSIS — E7800 Pure hypercholesterolemia, unspecified: Secondary | ICD-10-CM

## 2016-06-24 DIAGNOSIS — Z79899 Other long term (current) drug therapy: Secondary | ICD-10-CM | POA: Diagnosis not present

## 2016-06-24 LAB — COMPREHENSIVE METABOLIC PANEL
ALK PHOS: 52 U/L (ref 39–117)
ALT: 15 U/L (ref 0–35)
AST: 16 U/L (ref 0–37)
Albumin: 4.6 g/dL (ref 3.5–5.2)
BILIRUBIN TOTAL: 0.6 mg/dL (ref 0.2–1.2)
BUN: 16 mg/dL (ref 6–23)
CALCIUM: 9.8 mg/dL (ref 8.4–10.5)
CO2: 30 mEq/L (ref 19–32)
Chloride: 108 mEq/L (ref 96–112)
Creatinine, Ser: 0.77 mg/dL (ref 0.40–1.20)
GFR: 79.37 mL/min (ref >60.00)
GLUCOSE: 93 mg/dL (ref 70–99)
Potassium: 4.9 mEq/L (ref 3.5–5.1)
Sodium: 142 mEq/L (ref 135–145)
TOTAL PROTEIN: 6.7 g/dL (ref 6.0–8.3)

## 2016-06-24 LAB — LIPID PANEL
Cholesterol: 169 mg/dL (ref 0–200)
HDL: 73.9 mg/dL (ref >39.00)
LDL Cholesterol: 79 mg/dL (ref 0–99)
NonHDL: 95.48
TRIGLYCERIDES: 84 mg/dL (ref 0.0–149.0)
Total CHOL/HDL Ratio: 2
VLDL: 16.8 mg/dL (ref 0.0–40.0)

## 2016-06-24 LAB — HEPATIC FUNCTION PANEL
ALBUMIN: 4.6 g/dL (ref 3.5–5.2)
ALT: 15 U/L (ref 0–35)
AST: 16 U/L (ref 0–37)
Alkaline Phosphatase: 52 U/L (ref 39–117)
Bilirubin, Direct: 0.1 mg/dL (ref 0.0–0.3)
TOTAL PROTEIN: 6.7 g/dL (ref 6.0–8.3)
Total Bilirubin: 0.6 mg/dL (ref 0.2–1.2)

## 2016-06-25 ENCOUNTER — Ambulatory Visit
Admission: RE | Admit: 2016-06-25 | Discharge: 2016-06-25 | Disposition: A | Discharge status: Home / Self Care | Payer: BLUE CROSS/BLUE SHIELD | Source: Ambulatory Visit | Attending: Internal Medicine | Admitting: Internal Medicine

## 2016-06-25 DIAGNOSIS — Z1239 Encounter for other screening for malignant neoplasm of breast: Secondary | ICD-10-CM

## 2016-06-25 DIAGNOSIS — Z1231 Encounter for screening mammogram for malignant neoplasm of breast: Secondary | ICD-10-CM | POA: Insufficient documentation

## 2016-06-25 LAB — HEPATITIS C ANTIBODY: HCV Ab: NEGATIVE

## 2016-06-26 ENCOUNTER — Encounter: Payer: Self-pay | Admitting: *Deleted

## 2016-06-30 ENCOUNTER — Telehealth: Payer: Self-pay | Admitting: *Deleted

## 2016-06-30 NOTE — Telephone Encounter (Signed)
Pt requested lab results  Pt contact for today [phone]

## 2016-06-30 NOTE — Telephone Encounter (Signed)
Notes Recorded by Crecencio Mc, MD on 06/27/2016 at 5:52 PM EST Your cholesterol ha dropped 70 points !! on current statin therapy and your liver enzymes are normal. Please continue your current medications and plan to repeat labs in 6 months.

## 2016-10-26 ENCOUNTER — Telehealth: Payer: Self-pay | Admitting: Internal Medicine

## 2016-10-26 DIAGNOSIS — Z79899 Other long term (current) drug therapy: Secondary | ICD-10-CM

## 2016-10-26 DIAGNOSIS — E7800 Pure hypercholesterolemia, unspecified: Secondary | ICD-10-CM

## 2016-10-26 NOTE — Telephone Encounter (Signed)
No, perfect thank you

## 2016-10-26 NOTE — Telephone Encounter (Signed)
Ordered labs and scheduled pt for a fasting lab appt.   Ordered both the CMP and lipid panel is there anything else?

## 2016-10-26 NOTE — Telephone Encounter (Signed)
Pt called and stated that she needs labs done before her 8/2 appt. Please advise, thank you!  Call pt @ [phone]

## 2016-12-04 ENCOUNTER — Other Ambulatory Visit (INDEPENDENT_AMBULATORY_CARE_PROVIDER_SITE_OTHER): Payer: Medicare Other

## 2016-12-04 DIAGNOSIS — E7800 Pure hypercholesterolemia, unspecified: Secondary | ICD-10-CM | POA: Diagnosis not present

## 2016-12-04 DIAGNOSIS — Z79899 Other long term (current) drug therapy: Secondary | ICD-10-CM | POA: Diagnosis not present

## 2016-12-04 LAB — LIPID PANEL
CHOL/HDL RATIO: 2
Cholesterol: 144 mg/dL (ref 0–200)
HDL: 68.5 mg/dL (ref >39.00)
LDL Cholesterol: 61 mg/dL (ref 0–99)
NONHDL: 75.37
Triglycerides: 73 mg/dL (ref 0.0–149.0)
VLDL: 14.6 mg/dL (ref 0.0–40.0)

## 2016-12-04 LAB — COMPREHENSIVE METABOLIC PANEL
ALK PHOS: 44 U/L (ref 39–117)
ALT: 21 U/L (ref 0–35)
AST: 20 U/L (ref 0–37)
Albumin: 4.4 g/dL (ref 3.5–5.2)
BILIRUBIN TOTAL: 0.6 mg/dL (ref 0.2–1.2)
BUN: 13 mg/dL (ref 6–23)
CO2: 28 meq/L (ref 19–32)
Calcium: 9.9 mg/dL (ref 8.4–10.5)
Chloride: 107 mEq/L (ref 96–112)
Creatinine, Ser: 0.74 mg/dL (ref 0.40–1.20)
GFR: 82.99 mL/min (ref >60.00)
GLUCOSE: 90 mg/dL (ref 70–99)
POTASSIUM: 4.5 meq/L (ref 3.5–5.1)
SODIUM: 142 meq/L (ref 135–145)
TOTAL PROTEIN: 6.8 g/dL (ref 6.0–8.3)

## 2016-12-10 ENCOUNTER — Encounter: Payer: Self-pay | Admitting: Internal Medicine

## 2016-12-10 ENCOUNTER — Ambulatory Visit (INDEPENDENT_AMBULATORY_CARE_PROVIDER_SITE_OTHER): Payer: Medicare Other | Admitting: Internal Medicine

## 2016-12-10 DIAGNOSIS — E7800 Pure hypercholesterolemia, unspecified: Secondary | ICD-10-CM

## 2016-12-10 DIAGNOSIS — M85859 Other specified disorders of bone density and structure, unspecified thigh: Secondary | ICD-10-CM

## 2016-12-10 DIAGNOSIS — I2510 Atherosclerotic heart disease of native coronary artery without angina pectoris: Secondary | ICD-10-CM

## 2016-12-10 DIAGNOSIS — Z720 Tobacco use: Secondary | ICD-10-CM | POA: Diagnosis not present

## 2016-12-10 MED ORDER — ATORVASTATIN CALCIUM 20 MG PO TABS: 20.0000 mg | ORAL_TABLET | Freq: Every day | ORAL | 3 refills | Status: DC

## 2016-12-10 NOTE — Patient Instructions (Signed)
We should repeat your DEXA scan at your next visit  because there was a significant decrease in your left hip
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
## 2016-12-12 NOTE — Assessment & Plan Note (Signed)
Asymptomatic finding on CT .  Continue statin , aspirin and encouraged tobacco cessation.

## 2016-12-12 NOTE — Assessment & Plan Note (Signed)
She has had a SS decrease in bone mass since 2011,  And T score of hip is now -2.4 .  Will recommend starting alendronate if next scan this year shows progression. . Discussed the current controversies surrounding the risks and benefits of calcium supplementation.  Encouraged her to increase dietary calcium through natural foods including almond/coconut milk

## 2016-12-12 NOTE — Assessment & Plan Note (Signed)
LDL and triglycerides are at goal on current medications. He has no side effects and liver enzymes are normal. No changes today  Lab Results  Component Value Date   CHOL 144 12/04/2016   HDL 68.50 12/04/2016   LDLCALC 61 12/04/2016   LDLDIRECT 147.5 04/17/2013   TRIG 73.0 12/04/2016   CHOLHDL 2 12/04/2016   Lab Results  Component Value Date   ALT 21 12/04/2016   AST 20 12/04/2016   ALKPHOS 44 12/04/2016   BILITOT 0.6 12/04/2016

## 2016-12-12 NOTE — Assessment & Plan Note (Signed)
She has reduced her use to 1/4 to 1/2 pack daily but has been unable to quit due to new stressors

## 2017-02-03 DIAGNOSIS — Z23 Encounter for immunization: Secondary | ICD-10-CM | POA: Diagnosis not present

## 2017-03-03 DIAGNOSIS — H2512 Age-related nuclear cataract, left eye: Secondary | ICD-10-CM | POA: Diagnosis not present

## 2017-04-28 ENCOUNTER — Other Ambulatory Visit: Payer: Self-pay | Admitting: Internal Medicine

## 2017-06-11 ENCOUNTER — Other Ambulatory Visit: Payer: Self-pay | Admitting: Internal Medicine

## 2017-06-11 DIAGNOSIS — Z1231 Encounter for screening mammogram for malignant neoplasm of breast: Secondary | ICD-10-CM

## 2017-06-14 ENCOUNTER — Telehealth: Payer: Self-pay | Admitting: *Deleted

## 2017-06-14 NOTE — Telephone Encounter (Signed)
Left message for patient to notify them that it is time to schedule annual low dose lung cancer screening CT scan. Instructed patient to call back to verify information prior to the scan being scheduled.  

## 2017-06-15 ENCOUNTER — Telehealth: Payer: Self-pay | Admitting: *Deleted

## 2017-06-15 DIAGNOSIS — Z87891 Personal history of nicotine dependence: Secondary | ICD-10-CM

## 2017-06-15 DIAGNOSIS — Z122 Encounter for screening for malignant neoplasm of respiratory organs: Secondary | ICD-10-CM

## 2017-06-15 NOTE — Telephone Encounter (Signed)
Notified patient that annual lung cancer screening low dose CT scan is due currently or will be in near future. Confirmed that patient is within the age range of 55-77, and asymptomatic, (no signs or symptoms of lung cancer). Patient denies illness that would prevent curative treatment for lung cancer if found. Verified smoking history, (current, 34.75 pack year). The shared decision making visit was done 06/14/15. Patient is agreeable for CT scan being scheduled.

## 2017-06-16 ENCOUNTER — Encounter: Payer: Self-pay | Admitting: Internal Medicine

## 2017-06-16 ENCOUNTER — Ambulatory Visit (INDEPENDENT_AMBULATORY_CARE_PROVIDER_SITE_OTHER): Payer: Medicare Other | Admitting: Internal Medicine

## 2017-06-16 ENCOUNTER — Telehealth: Payer: Self-pay

## 2017-06-16 VITALS — BP 106/64 | HR 93 | Temp 97.9°F | Resp 14 | Ht 59.5 in | Wt 113.4 lb

## 2017-06-16 DIAGNOSIS — Z79899 Other long term (current) drug therapy: Secondary | ICD-10-CM | POA: Diagnosis not present

## 2017-06-16 DIAGNOSIS — Z716 Tobacco abuse counseling: Secondary | ICD-10-CM

## 2017-06-16 DIAGNOSIS — Z23 Encounter for immunization: Secondary | ICD-10-CM

## 2017-06-16 DIAGNOSIS — M85859 Other specified disorders of bone density and structure, unspecified thigh: Secondary | ICD-10-CM | POA: Diagnosis not present

## 2017-06-16 DIAGNOSIS — E7800 Pure hypercholesterolemia, unspecified: Secondary | ICD-10-CM | POA: Diagnosis not present

## 2017-06-16 DIAGNOSIS — E559 Vitamin D deficiency, unspecified: Secondary | ICD-10-CM

## 2017-06-16 DIAGNOSIS — Z0000 Encounter for general adult medical examination without abnormal findings: Secondary | ICD-10-CM

## 2017-06-16 LAB — LIPID PANEL
CHOLESTEROL: 159 mg/dL (ref 0–200)
HDL: 66.1 mg/dL (ref >39.00)
LDL CALC: 78 mg/dL (ref 0–99)
NonHDL: 92.62
TRIGLYCERIDES: 72 mg/dL (ref 0.0–149.0)
Total CHOL/HDL Ratio: 2
VLDL: 14.4 mg/dL (ref 0.0–40.0)

## 2017-06-16 LAB — COMPREHENSIVE METABOLIC PANEL
ALBUMIN: 4.6 g/dL (ref 3.5–5.2)
ALK PHOS: 55 U/L (ref 39–117)
ALT: 14 U/L (ref 0–35)
AST: 16 U/L (ref 0–37)
BUN: 13 mg/dL (ref 6–23)
CALCIUM: 9.9 mg/dL (ref 8.4–10.5)
CHLORIDE: 105 meq/L (ref 96–112)
CO2: 29 mEq/L (ref 19–32)
Creatinine, Ser: 0.8 mg/dL (ref 0.40–1.20)
GFR: 75.73 mL/min (ref >60.00)
Glucose, Bld: 92 mg/dL (ref 70–99)
POTASSIUM: 4.4 meq/L (ref 3.5–5.1)
Sodium: 141 mEq/L (ref 135–145)
TOTAL PROTEIN: 7.2 g/dL (ref 6.0–8.3)
Total Bilirubin: 0.5 mg/dL (ref 0.2–1.2)

## 2017-06-16 LAB — VITAMIN D 25 HYDROXY (VIT D DEFICIENCY, FRACTURES): VITD: 23.31 ng/mL — AB (ref 30.00–100.00)

## 2017-06-16 MED ORDER — LORAZEPAM 1 MG PO TABS: 1.0000 mg | ORAL_TABLET | Freq: Every day | ORAL | 1 refills | Status: DC

## 2017-06-16 NOTE — Patient Instructions (Signed)
Health Maintenance for Postmenopausal Women Menopause is a normal process in which your reproductive ability comes to an end. This process happens gradually over a span of months to years, usually between the ages of 22 and 9. Menopause is complete when you have missed 12 consecutive menstrual periods. It is important to talk with your health care provider about some of the most common conditions that affect postmenopausal women, such as heart disease, cancer, and bone loss (osteoporosis). Adopting a healthy lifestyle and getting preventive care can help to promote your health and wellness. Those actions can also lower your chances of developing some of these common conditions. What should I know about menopause? During menopause, you may experience a number of symptoms, such as:  Moderate-to-severe hot flashes.  Night sweats.  Decrease in sex drive.  Mood swings.  Headaches.  Tiredness.  Irritability.  Memory problems.  Insomnia.  Choosing to treat or not to treat menopausal changes is an individual decision that you make with your health care provider. What should I know about hormone replacement therapy and supplements? Hormone therapy products are effective for treating symptoms that are associated with menopause, such as hot flashes and night sweats. Hormone replacement carries certain risks, especially as you become older. If you are thinking about using estrogen or estrogen with progestin treatments, discuss the benefits and risks with your health care provider. What should I know about heart disease and stroke? Heart disease, heart attack, and stroke become more likely as you age. This may be due, in part, to the hormonal changes that your body experiences during menopause. These can affect how your body processes dietary fats, triglycerides, and cholesterol. Heart attack and stroke are both medical emergencies. There are many things that you can do to help prevent heart disease  and stroke:  Have your blood pressure checked at least every 1-2 years. High blood pressure causes heart disease and increases the risk of stroke.  If you are 53-22 years old, ask your health care provider if you should take aspirin to prevent a heart attack or a stroke.  Do not use any tobacco products, including cigarettes, chewing tobacco, or electronic cigarettes. If you need help quitting, ask your health care provider.  It is important to eat a healthy diet and maintain a healthy weight. ? Be sure to include plenty of vegetables, fruits, low-fat dairy products, and lean protein. ? Avoid eating foods that are high in solid fats, added sugars, or salt (sodium).  Get regular exercise. This is one of the most important things that you can do for your health. ? Try to exercise for at least 150 minutes each week. The type of exercise that you do should increase your heart rate and make you sweat. This is known as moderate-intensity exercise. ? Try to do strengthening exercises at least twice each week. Do these in addition to the moderate-intensity exercise.  Know your numbers.Ask your health care provider to check your cholesterol and your blood glucose. Continue to have your blood tested as directed by your health care provider.  What should I know about cancer screening? There are several types of cancer. Take the following steps to reduce your risk and to catch any cancer development as early as possible. Breast Cancer  Practice breast self-awareness. ? This means understanding how your breasts normally appear and feel. ? It also means doing regular breast self-exams. Let your health care provider know about any changes, no matter how small.  If you are 40  or older, have a clinician do a breast exam (clinical breast exam or CBE) every year. Depending on your age, family history, and medical history, it may be recommended that you also have a yearly breast X-ray (mammogram).  If you  have a family history of breast cancer, talk with your health care provider about genetic screening.  If you are at high risk for breast cancer, talk with your health care provider about having an MRI and a mammogram every year.  Breast cancer (BRCA) gene test is recommended for women who have family members with BRCA-related cancers. Results of the assessment will determine the need for genetic counseling and BRCA1 and for BRCA2 testing. BRCA-related cancers include these types: ? Breast. This occurs in males or females. ? Ovarian. ? Tubal. This may also be called fallopian tube cancer. ? Cancer of the abdominal or pelvic lining (peritoneal cancer). ? Prostate. ? Pancreatic.  Cervical, Uterine, and Ovarian Cancer Your health care provider may recommend that you be screened regularly for cancer of the pelvic organs. These include your ovaries, uterus, and vagina. This screening involves a pelvic exam, which includes checking for microscopic changes to the surface of your cervix (Pap test).  For women ages 21-65, health care providers may recommend a pelvic exam and a Pap test every three years. For women ages 79-65, they may recommend the Pap test and pelvic exam, combined with testing for human papilloma virus (HPV), every five years. Some types of HPV increase your risk of cervical cancer. Testing for HPV may also be done on women of any age who have unclear Pap test results.  Other health care providers may not recommend any screening for nonpregnant women who are considered low risk for pelvic cancer and have no symptoms. Ask your health care provider if a screening pelvic exam is right for you.  If you have had past treatment for cervical cancer or a condition that could lead to cancer, you need Pap tests and screening for cancer for at least 20 years after your treatment. If Pap tests have been discontinued for you, your risk factors (such as having a new sexual partner) need to be  reassessed to determine if you should start having screenings again. Some women have medical problems that increase the chance of getting cervical cancer. In these cases, your health care provider may recommend that you have screening and Pap tests more often.  If you have a family history of uterine cancer or ovarian cancer, talk with your health care provider about genetic screening.  If you have vaginal bleeding after reaching menopause, tell your health care provider.  There are currently no reliable tests available to screen for ovarian cancer.  Lung Cancer Lung cancer screening is recommended for adults 69-62 years old who are at high risk for lung cancer because of a history of smoking. A yearly low-dose CT scan of the lungs is recommended if you:  Currently smoke.  Have a history of at least 30 pack-years of smoking and you currently smoke or have quit within the past 15 years. A pack-year is smoking an average of one pack of cigarettes per day for one year.  Yearly screening should:  Continue until it has been 15 years since you quit.  Stop if you develop a health problem that would prevent you from having lung cancer treatment.  Colorectal Cancer  This type of cancer can be detected and can often be prevented.  Routine colorectal cancer screening usually begins at  age 42 and continues through age 45.  If you have risk factors for colon cancer, your health care provider may recommend that you be screened at an earlier age.  If you have a family history of colorectal cancer, talk with your health care provider about genetic screening.  Your health care provider may also recommend using home test kits to check for hidden blood in your stool.  A small camera at the end of a tube can be used to examine your colon directly (sigmoidoscopy or colonoscopy). This is done to check for the earliest forms of colorectal cancer.  Direct examination of the colon should be repeated every  5-10 years until age 71. However, if early forms of precancerous polyps or small growths are found or if you have a family history or genetic risk for colorectal cancer, you may need to be screened more often.  Skin Cancer  Check your skin from head to toe regularly.  Monitor any moles. Be sure to tell your health care provider: ? About any new moles or changes in moles, especially if there is a change in a mole's shape or color. ? If you have a mole that is larger than the size of a pencil eraser.  If any of your family members has a history of skin cancer, especially at a young age, talk with your health care provider about genetic screening.  Always use sunscreen. Apply sunscreen liberally and repeatedly throughout the day.  Whenever you are outside, protect yourself by wearing long sleeves, pants, a wide-brimmed hat, and sunglasses.  What should I know about osteoporosis? Osteoporosis is a condition in which bone destruction happens more quickly than new bone creation. After menopause, you may be at an increased risk for osteoporosis. To help prevent osteoporosis or the bone fractures that can happen because of osteoporosis, the following is recommended:  If you are 46-71 years old, get at least 1,000 mg of calcium and at least 600 mg of vitamin D per day.  If you are older than age 55 but younger than age 65, get at least 1,200 mg of calcium and at least 600 mg of vitamin D per day.  If you are older than age 54, get at least 1,200 mg of calcium and at least 800 mg of vitamin D per day.  Smoking and excessive alcohol intake increase the risk of osteoporosis. Eat foods that are rich in calcium and vitamin D, and do weight-bearing exercises several times each week as directed by your health care provider. What should I know about how menopause affects my mental health? Depression may occur at any age, but it is more common as you become older. Common symptoms of depression  include:  Low or sad mood.  Changes in sleep patterns.  Changes in appetite or eating patterns.  Feeling an overall lack of motivation or enjoyment of activities that you previously enjoyed.  Frequent crying spells.  Talk with your health care provider if you think that you are experiencing depression. What should I know about immunizations? It is important that you get and maintain your immunizations. These include:  Tetanus, diphtheria, and pertussis (Tdap) booster vaccine.  Influenza every year before the flu season begins.  Pneumonia vaccine.  Shingles vaccine.  Your health care provider may also recommend other immunizations. This information is not intended to replace advice given to you by your health care provider. Make sure you discuss any questions you have with your health care provider. Document Released: 06/19/2005  Document Revised: 11/15/2015 Document Reviewed: 01/29/2015 Elsevier Interactive Patient Education  2018 [employer]  

## 2017-06-16 NOTE — Telephone Encounter (Signed)
Left patient message for patient  advising script refaxed to pharmacy. Called pharmacy and confirmed script received.

## 2017-06-16 NOTE — Telephone Encounter (Signed)
Copied from [id number] [phone]. Topic: General - Other >> Jun 16, 2017 10:00 AM Marin Olp L wrote: Reason for CRM: Patient needs to know if her script for lorazepam was printed today, of so she doesn't have it. It does say print, but it also shows her pharmacy on the order. Advised to to call her pharm and if they don't have to come back to the office.

## 2017-06-18 ENCOUNTER — Telehealth: Payer: Self-pay | Admitting: Internal Medicine

## 2017-06-18 NOTE — Telephone Encounter (Signed)
Patient is calling to remind provider to send her shingles vaccine to the pharmacy.   Pharmacy: [facility] [address]/[place]

## 2017-06-18 NOTE — Telephone Encounter (Signed)
Copied from [place]. Topic: Quick Communication - See Telephone Encounter >> Jun 18, 2017  8:43 AM Burnis Medin, NT wrote: CRM for notification. See Telephone encounter for: Patient called and said she was just seen in the office and the doctor told her she was going to send her a prescription in  to prevent shingles but script hasn't been sent yet. [facility] uses [id number] [address], [place] [phone] (Phone) [phone] (Fax)    06/18/17.

## 2017-06-18 NOTE — Telephone Encounter (Signed)
FYI

## 2017-06-19 MED ORDER — ERGOCALCIFEROL 1.25 MG (50000 UT) PO CAPS: 50000.0000 [IU] | ORAL_CAPSULE | ORAL | 0 refills | Status: DC

## 2017-06-19 NOTE — Assessment & Plan Note (Signed)
Annual comprehensive preventive exam was done as well as an evaluation and management of chronic conditions .  During the course of the visit the patient was educated and counseled about appropriate screening and preventive services including :  diabetes screening, lipid analysis with projected  10 year  risk for CAD , nutrition counseling, breast, cervical and colorectal cancer screening, and recommended immunizations.  Printed recommendations for health maintenance screenings was given 

## 2017-06-19 NOTE — Assessment & Plan Note (Signed)
Smoking cessation instruction/counseling given.   Risks of continued tobacco use were discussed. She is not currently interested in tobacco cessation.  

## 2017-06-19 NOTE — Assessment & Plan Note (Signed)
LDL and triglycerides are at goal on atorvastatin 20 mg daily  SHE has no side effects and liver enzymes are normal. No changes today  Lab Results  Component Value Date   CHOL 159 06/16/2017   HDL 66.10 06/16/2017   LDLCALC 78 06/16/2017   LDLDIRECT 147.5 04/17/2013   TRIG 72.0 06/16/2017   CHOLHDL 2 06/16/2017   Lab Results  Component Value Date   ALT 14 06/16/2017   AST 16 06/16/2017   ALKPHOS 55 06/16/2017   BILITOT 0.5 06/16/2017

## 2017-06-19 NOTE — Assessment & Plan Note (Signed)
Low,  With osteopenia on prior DEXA .  Drisdol 50K weekly x 3,  Then continue oral supplementation  2000 IUs daily

## 2017-06-19 NOTE — Progress Notes (Signed)
Patient ID: Kimberly Schmitt, female    DOB: 1950-06-23  Age: 67 y.o. MRN: [id number]  The patient is here for follow up and  management of other chronic and acute problems.  Due for colonoscopy in 2022 Mammogram due feb 2019 dexa 2015 showed progression T score -2.4 left hip 20 % lifetime risk  Annual Lung cancer screening due AND SCHEDULED    The risk factors are reflected in the social history.  The roster of all physicians providing medical care to patient - is listed in the Snapshot section of the chart.  Activities of daily living:  The patient is 100% independent in all ADLs: dressing, toileting, feeding as well as independent mobility  Home safety : The patient has smoke detectors in the home. They wear seatbelts.  There are no firearms at home. There is no violence in the home.   There is no risks for hepatitis, STDs or HIV. There is no   history of blood transfusion. They have no travel history to infectious disease endemic areas of the world.  The patient has seen their dentist in the last six month. They have seen their eye doctor in the last year. They admit to slight hearing difficulty with regard to whispered voices and some television programs.  They have deferred audiologic testing in the last year.  They do not  have excessive sun exposure. Discussed the need for sun protection: hats, long sleeves and use of sunscreen if there is significant sun exposure.   Diet: the importance of a healthy diet is discussed. They do have a healthy diet.  The benefits of regular aerobic exercise were discussed. She walkS 7 DAYS s per week ,  30 minutes.   Depression screen: there are no signs or vegative symptoms of depression- irritability, change in appetite, anhedonia, sadness/tearfullness.  Cognitive assessment: the patient manages all their financial and personal affairs and is actively engaged. They could relate day,date,year and events; recalled 2/3 objects at 3 minutes; performed  clock-face test normally.  The following portions of the patient's history were reviewed and updated as appropriate: allergies, current medications, past family history, past medical history,  past surgical history, past social history  and problem list.  Visual acuity was not assessed per patient preference since she has regular follow up with her ophthalmologist. Hearing and body mass index were assessed and reviewed.   During the course of the visit the patient was educated and counseled about appropriate screening and preventive services including : fall prevention , diabetes screening, nutrition counseling, colorectal cancer screening, and recommended immunizations.    CC: The primary encounter diagnosis was Pure hypercholesterolemia. Diagnoses of Vitamin D deficiency, High risk medication use, Need for 23-polyvalent pneumococcal polysaccharide vaccine, Tobacco abuse counseling, Visit for preventive health examination, and Osteopenia of hip, unspecified laterality were also pertinent to this visit.  History Corinthian has a past medical history of Hyperlipidemia and Personal history of tobacco use, presenting hazards to health (06/12/2015).   She has a past surgical history that includes Appendectomy (1968).   Her family history includes Breast cancer (age of onset: 38) in her maternal grandmother; Cancer in her maternal grandmother; Cancer (age of onset: 47) in her brother; Cancer (age of onset: 31) in her father; Diabetes in her sister; Stroke in her maternal aunt.She reports that she has been smoking cigarettes.  She has a 22.50 pack-year smoking history. she has never used smokeless tobacco. She reports that she does not drink alcohol or use drugs.  Outpatient Medications Prior to Visit  Medication Sig Dispense Refill  . aspirin 81 MG tablet Take 81 mg by mouth daily.    Marland Kitchen atorvastatin (LIPITOR) 20 MG tablet Take 1 tablet (20 mg total) by mouth daily. 90 tablet 3  . Probiotic Product  (PROBIOTIC DAILY PO) Take by mouth.    Marland Kitchen atorvastatin (LIPITOR) 20 MG tablet TAKE 1 TABLET (20 MG TOTAL) BY MOUTH DAILY. (Patient not taking: Reported on 06/16/2017) 90 tablet 0   No facility-administered medications prior to visit.      Review of Systems   Patient denies headache, fevers, malaise, unintentional weight loss, skin rash, eye pain, sinus congestion and sinus pain, sore throat, dysphagia,  hemoptysis , cough, dyspnea, wheezing, chest pain, palpitations, orthopnea, edema, abdominal pain, nausea, melena, diarrhea, constipation, flank pain, dysuria, hematuria, urinary  Frequency, nocturia, numbness, tingling, seizures,  Focal weakness, Loss of consciousness,  Tremor, insomnia, depression, anxiety, and suicidal ideation.      Objective:  BP 106/64 (BP Location: Left Arm, Patient Position: Sitting, Cuff Size: Normal)   Pulse 93   Temp 97.9 F (36.6 C) (Oral)   Resp 14   Ht 4' 11.5" (1.511 m)   Wt 113 lb 6.4 oz (51.4 kg)   SpO2 98%   BMI 22.52 kg/m   Physical Exam   General appearance: alert, cooperative and appears stated age Head: Normocephalic, without obvious abnormality, atraumatic Eyes: conjunctivae/corneas clear. PERRL, EOM's intact. Fundi benign. Ears: normal TM's and external ear canals both ears Nose: Nares normal. Septum midline. Mucosa normal. No drainage or sinus tenderness. Throat: lips, mucosa, and tongue normal; teeth and gums normal Neck: no adenopathy, no carotid bruit, no JVD, supple, symmetrical, trachea midline and thyroid not enlarged, symmetric, no tenderness/mass/nodules Lungs: clear to auscultation bilaterally Breasts: normal appearance, no masses or tenderness Heart: regular rate and rhythm, S1, S2 normal, no murmur, click, rub or gallop Abdomen: soft, non-tender; bowel sounds normal; no masses,  no organomegaly Extremities: extremities normal, atraumatic, no cyanosis or edema Pulses: 2+ and symmetric Skin: Skin color, texture, turgor normal. No  rashes or lesions Neurologic: Alert and oriented X 3, normal strength and tone. Normal symmetric reflexes. Normal coordination and gait.      Assessment & Plan:   Problem List Items Addressed This Visit    Vitamin D deficiency   Relevant Orders   Vitamin D (25 hydroxy) (Completed)   Visit for preventive health examination    Annual comprehensive preventive exam was done as well as an evaluation and management of chronic conditions .  During the course of the visit the patient was educated and counseled about appropriate screening and preventive services including :  diabetes screening, lipid analysis with projected  10 year  risk for CAD , nutrition counseling, breast, cervical and colorectal cancer screening, and recommended immunizations.  Printed recommendations for health maintenance screenings was given       Tobacco abuse counseling    Smoking cessation instruction/counseling given: Risks of continued tobacco use were discussed. She is not currently interested in tobacco cessation.       Osteopenia    HER TEN YEAR RISK OF FRACTURE  IS 6%,  LIFETIME RISK IS 20%  T SCORE LEFT HIP -2.4 BY 2014 DEXA      Hyperlipidemia - Primary    LDL and triglycerides are at goal on atorvastatin 20 mg daily  SHE has no side effects and liver enzymes are normal. No changes today  Lab Results  Component Value Date  CHOL 159 06/16/2017   HDL 66.10 06/16/2017   LDLCALC 78 06/16/2017   LDLDIRECT 147.5 04/17/2013   TRIG 72.0 06/16/2017   CHOLHDL 2 06/16/2017   Lab Results  Component Value Date   ALT 14 06/16/2017   AST 16 06/16/2017   ALKPHOS 55 06/16/2017   BILITOT 0.5 06/16/2017         Relevant Orders   Lipid Profile (Completed)    Other Visit Diagnoses    High risk medication use       Relevant Orders   Comp Met (CMET) (Completed)   Need for 23-polyvalent pneumococcal polysaccharide vaccine       Relevant Orders   Pneumococcal polysaccharide vaccine 23-valent greater than or  equal to 2yo subcutaneous/IM (Completed)     A total of 40 minutes of face to face time was spent with patient more than half of which was spent in counselling about the above mentioned conditions  and coordination of care  I am having Elexia A. Chovanec start on LORazepam. I am also having her maintain her aspirin, Probiotic Product (PROBIOTIC DAILY PO), and atorvastatin.  Meds ordered this encounter  Medications  . LORazepam (ATIVAN) 1 MG tablet    Sig: Take 1 tablet (1 mg total) by mouth at bedtime. As needed for insomnia    Dispense:  30 tablet    Refill:  1    Medications Discontinued During This Encounter  Medication Reason  . atorvastatin (LIPITOR) 20 MG tablet Duplicate    Follow-up: No Follow-up on file.   Crecencio Mc, MD

## 2017-06-19 NOTE — Assessment & Plan Note (Signed)
HER TEN YEAR RISK OF FRACTURE  IS 6%,  LIFETIME RISK IS 20%  T SCORE LEFT HIP -2.4 BY 2014 DEXA

## 2017-06-21 MED ORDER — ZOSTER VAC RECOMB ADJUVANTED 50 MCG/0.5ML IM SUSR: 0.5000 mL | Freq: Once | INTRAMUSCULAR | 1 refills | Status: AC

## 2017-06-21 NOTE — Telephone Encounter (Signed)
rx has been faxed to pt's pharmacy. Pt is aware.

## 2017-07-05 ENCOUNTER — Ambulatory Visit
Admission: RE | Admit: 2017-07-05 | Discharge: 2017-07-05 | Disposition: A | Discharge status: Home / Self Care | Payer: Medicare Other | Source: Ambulatory Visit | Attending: Internal Medicine | Admitting: Internal Medicine

## 2017-07-05 DIAGNOSIS — Z1231 Encounter for screening mammogram for malignant neoplasm of breast: Secondary | ICD-10-CM | POA: Insufficient documentation

## 2017-07-14 ENCOUNTER — Ambulatory Visit
Admission: RE | Admit: 2017-07-14 | Discharge: 2017-07-14 | Disposition: A | Discharge status: Home / Self Care | Payer: Medicare Other | Source: Ambulatory Visit | Attending: Oncology | Admitting: Oncology

## 2017-07-14 DIAGNOSIS — I700 Atherosclerosis of aorta: Secondary | ICD-10-CM | POA: Diagnosis not present

## 2017-07-14 DIAGNOSIS — J438 Other emphysema: Secondary | ICD-10-CM | POA: Diagnosis not present

## 2017-07-14 DIAGNOSIS — F17210 Nicotine dependence, cigarettes, uncomplicated: Secondary | ICD-10-CM | POA: Diagnosis not present

## 2017-07-14 DIAGNOSIS — J432 Centrilobular emphysema: Secondary | ICD-10-CM | POA: Diagnosis not present

## 2017-07-14 DIAGNOSIS — Z87891 Personal history of nicotine dependence: Secondary | ICD-10-CM | POA: Diagnosis not present

## 2017-07-14 DIAGNOSIS — R918 Other nonspecific abnormal finding of lung field: Secondary | ICD-10-CM | POA: Diagnosis not present

## 2017-07-14 DIAGNOSIS — Z122 Encounter for screening for malignant neoplasm of respiratory organs: Secondary | ICD-10-CM | POA: Insufficient documentation

## 2017-07-14 DIAGNOSIS — I2510 Atherosclerotic heart disease of native coronary artery without angina pectoris: Secondary | ICD-10-CM | POA: Diagnosis not present

## 2017-07-21 ENCOUNTER — Encounter: Payer: Self-pay | Admitting: *Deleted

## 2017-08-07 ENCOUNTER — Other Ambulatory Visit: Payer: Self-pay | Admitting: Internal Medicine

## 2017-08-17 DIAGNOSIS — M545 Low back pain: Secondary | ICD-10-CM | POA: Diagnosis not present

## 2017-08-17 DIAGNOSIS — R350 Frequency of micturition: Secondary | ICD-10-CM | POA: Diagnosis not present

## 2017-10-09 ENCOUNTER — Other Ambulatory Visit: Payer: Self-pay | Admitting: Internal Medicine

## 2017-12-16 ENCOUNTER — Ambulatory Visit (INDEPENDENT_AMBULATORY_CARE_PROVIDER_SITE_OTHER): Payer: Medicare Other | Admitting: Internal Medicine

## 2017-12-16 ENCOUNTER — Encounter: Payer: Self-pay | Admitting: Internal Medicine

## 2017-12-16 VITALS — BP 112/74 | HR 81 | Temp 97.7°F | Resp 15 | Ht 59.5 in | Wt 103.4 lb

## 2017-12-16 DIAGNOSIS — R634 Abnormal weight loss: Secondary | ICD-10-CM

## 2017-12-16 DIAGNOSIS — E559 Vitamin D deficiency, unspecified: Secondary | ICD-10-CM | POA: Diagnosis not present

## 2017-12-16 DIAGNOSIS — F5105 Insomnia due to other mental disorder: Secondary | ICD-10-CM | POA: Diagnosis not present

## 2017-12-16 DIAGNOSIS — E7800 Pure hypercholesterolemia, unspecified: Secondary | ICD-10-CM | POA: Diagnosis not present

## 2017-12-16 DIAGNOSIS — F419 Anxiety disorder, unspecified: Secondary | ICD-10-CM | POA: Diagnosis not present

## 2017-12-16 LAB — LIPID PANEL
CHOL/HDL RATIO: 2
Cholesterol: 142 mg/dL (ref 0–200)
HDL: 67.6 mg/dL (ref >39.00)
LDL CALC: 62 mg/dL (ref 0–99)
NonHDL: 74.54
TRIGLYCERIDES: 61 mg/dL (ref 0.0–149.0)
VLDL: 12.2 mg/dL (ref 0.0–40.0)

## 2017-12-16 LAB — COMPREHENSIVE METABOLIC PANEL
ALT: 17 U/L (ref 0–35)
AST: 17 U/L (ref 0–37)
Albumin: 4.6 g/dL (ref 3.5–5.2)
Alkaline Phosphatase: 60 U/L (ref 39–117)
BUN: 15 mg/dL (ref 6–23)
CALCIUM: 10.2 mg/dL (ref 8.4–10.5)
CHLORIDE: 105 meq/L (ref 96–112)
CO2: 30 meq/L (ref 19–32)
CREATININE: 0.78 mg/dL (ref 0.40–1.20)
GFR: 77.85 mL/min (ref >60.00)
Glucose, Bld: 99 mg/dL (ref 70–99)
Potassium: 4.4 mEq/L (ref 3.5–5.1)
SODIUM: 142 meq/L (ref 135–145)
Total Bilirubin: 0.6 mg/dL (ref 0.2–1.2)
Total Protein: 7.1 g/dL (ref 6.0–8.3)

## 2017-12-16 LAB — VITAMIN D 25 HYDROXY (VIT D DEFICIENCY, FRACTURES): VITD: 59.53 ng/mL (ref 30.00–100.00)

## 2017-12-16 LAB — TSH: TSH: 0.84 u[IU]/mL (ref 0.35–4.50)

## 2017-12-16 MED ORDER — LORAZEPAM 1 MG PO TABS: 1.0000 mg | ORAL_TABLET | Freq: Two times a day (BID) | ORAL | 5 refills | Status: DC

## 2017-12-16 NOTE — Progress Notes (Signed)
Subjective:  Patient ID: Kimberly Schmitt, female    DOB: [birth year]  Age: 67 y.o. MRN: [id number]  CC: The primary encounter diagnosis was Vitamin D deficiency. Diagnoses of Pure hypercholesterolemia, Weight loss, and Insomnia secondary to anxiety were also pertinent to this visit.  HPI Kimberly Schmitt presents for 6 moth follow up on multiple conditions  She has been experiencing more anxiety and insomnia due to her husband "Junior's" 9 yr battle with  Now recurrent and metastatic  thyroid cancer with respiratory compromise.  She has been using the lorazepam at night without adverse events and is requesting refill . She    Outpatient Medications Prior to Visit  Medication Sig Dispense Refill  . aspirin 81 MG tablet Take 81 mg by mouth daily.    Marland Kitchen atorvastatin (LIPITOR) 20 MG tablet TAKE 1 TABLET (20 MG TOTAL) BY MOUTH DAILY. 90 tablet 1  . cholecalciferol (VITAMIN D) 1000 units tablet Take 1,000 Units by mouth daily.    . COD LIVER OIL PO Take 1 capsule by mouth daily.    . Probiotic Product (PROBIOTIC DAILY PO) Take by mouth.    Marland Kitchen LORazepam (ATIVAN) 1 MG tablet Take 1 tablet (1 mg total) by mouth at bedtime. As needed for insomnia 30 tablet 1  . atorvastatin (LIPITOR) 20 MG tablet Take 1 tablet (20 mg total) by mouth daily. (Patient not taking: Reported on 12/16/2017) 90 tablet 3  . ergocalciferol (DRISDOL) 50000 units capsule Take 1 capsule (50,000 Units total) by mouth once a week. (Patient not taking: Reported on 12/16/2017) 12 capsule 0   No facility-administered medications prior to visit.     Review of Systems;  Patient denies headache, fevers, malaise, unintentional weight loss, skin rash, eye pain, sinus congestion and sinus pain, sore throat, dysphagia,  hemoptysis , cough, dyspnea, wheezing, chest pain, palpitations, orthopnea, edema, abdominal pain, nausea, melena, diarrhea, constipation, flank pain, dysuria, hematuria, urinary  Frequency, nocturia, numbness, tingling,  seizures,  Focal weakness, Loss of consciousness,  Tremor, insomnia, depression, anxiety, and suicidal ideation.      Objective:  BP 112/74 (BP Location: Left Arm, Patient Position: Sitting, Cuff Size: Normal)   Pulse 81   Temp 97.7 F (36.5 C) (Oral)   Resp 15   Ht 4' 11.5" (1.511 m)   Wt 103 lb 6.4 oz (46.9 kg)   SpO2 99%   BMI 20.53 kg/m   BP Readings from Last 3 Encounters:  12/16/17 112/74  06/16/17 106/64  12/10/16 (!) 110/7    Wt Readings from Last 3 Encounters:  12/16/17 103 lb 6.4 oz (46.9 kg)  07/14/17 113 lb (51.3 kg)  06/16/17 113 lb 6.4 oz (51.4 kg)    General appearance: alert, cooperative and appears stated age Ears: normal TM's and external ear canals both ears Throat: lips, mucosa, and tongue normal; teeth and gums normal Neck: no adenopathy, no carotid bruit, supple, symmetrical, trachea midline and thyroid not enlarged, symmetric, no tenderness/mass/nodules Back: symmetric, no curvature. ROM normal. No CVA tenderness. Lungs: clear to auscultation bilaterally Heart: regular rate and rhythm, S1, S2 normal, no murmur, click, rub or gallop Abdomen: soft, non-tender; bowel sounds normal; no masses,  no organomegaly Pulses: 2+ and symmetric Skin: Skin color, texture, turgor normal. No rashes or lesions Lymph nodes: Cervical, supraclavicular, and axillary nodes normal.  No results found for: HGBA1C  Lab Results  Component Value Date   CREATININE 0.78 12/16/2017   CREATININE 0.80 06/16/2017   CREATININE 0.74 12/04/2016    Lab  Results  Component Value Date   WBC 10.6 (H) 05/13/2016   HGB 15.0 05/13/2016   HCT 45.0 05/13/2016   PLT 350.0 05/13/2016   GLUCOSE 99 12/16/2017   CHOL 142 12/16/2017   TRIG 61.0 12/16/2017   HDL 67.60 12/16/2017   LDLDIRECT 147.5 04/17/2013   LDLCALC 62 12/16/2017   ALT 17 12/16/2017   AST 17 12/16/2017   NA 142 12/16/2017   K 4.4 12/16/2017   CL 105 12/16/2017   CREATININE 0.78 12/16/2017   BUN 15 12/16/2017    CO2 30 12/16/2017   TSH 0.84 12/16/2017    Ct Chest Lung Cancer Screening Low Dose Wo Contrast  Result Date: 07/15/2017 CLINICAL DATA:  67 year old female current smoker with 35 pack-year history of smoking. Lung cancer screening examination. EXAM: CT CHEST WITHOUT CONTRAST LOW-DOSE FOR LUNG CANCER SCREENING TECHNIQUE: Multidetector CT imaging of the chest was performed following the standard protocol without IV contrast. COMPARISON:  Low-dose lung cancer screening chest CT 06/17/2016. FINDINGS: Cardiovascular: Heart size is normal. There is no significant pericardial fluid, thickening or pericardial calcification. There is aortic atherosclerosis, as well as atherosclerosis of the great vessels of the mediastinum and the coronary arteries, including calcified atherosclerotic plaque in the left main, left anterior descending and left circumflex coronary arteries. Mediastinum/Nodes: No pathologically enlarged mediastinal or hilar lymph nodes. Please note that accurate exclusion of hilar adenopathy is limited on noncontrast CT scans. Esophagus is unremarkable in appearance. No axillary lymphadenopathy. Lungs/Pleura: Tiny calcified noncalcified pulmonary nodules are noted in the lungs bilaterally. The largest noncalcified pulmonary nodule is associated with the left major fissure (axial image 157 of series 3), with a volume derived mean diameter of only 3.7 mm. No larger more suspicious appearing pulmonary nodules or masses are noted. Diffuse bronchial wall thickening with moderate centrilobular and paraseptal emphysema. No acute consolidative airspace disease. No pleural effusions. Upper Abdomen: Aortic atherosclerosis. Musculoskeletal: There are no aggressive appearing lytic or blastic lesions noted in the visualized portions of the skeleton. IMPRESSION: 1. Lung-RADS 2S, benign appearance or behavior. Continue annual screening with low-dose chest CT without contrast in 12 months. 2. The "S" modifier above refers  to potentially clinically significant non lung cancer related findings. Specifically, there is aortic atherosclerosis, in addition to left main and 2 vessel coronary artery disease. Please note that although the presence of coronary artery calcium documents the presence of coronary artery disease, the severity of this disease and any potential stenosis cannot be assessed on this non-gated CT examination. Assessment for potential risk factor modification, dietary therapy or pharmacologic therapy may be warranted, if clinically indicated. 3. Mild diffuse bronchial wall thickening with moderate centrilobular and paraseptal emphysema; imaging findings suggestive of underlying COPD. Aortic Atherosclerosis (ICD10-I70.0) and Emphysema (ICD10-J43.9). Electronically Signed   By: Vinnie Langton M.D.   On: 07/15/2017 09:30    Assessment & Plan:   Problem List Items Addressed This Visit    Hyperlipidemia    LDL and triglycerides are at goal on atorvastatin 20 mg daily  She has no side effects and liver enzymes are normal. No changes today  Lab Results  Component Value Date   CHOL 142 12/16/2017   HDL 67.60 12/16/2017   LDLCALC 62 12/16/2017   LDLDIRECT 147.5 04/17/2013   TRIG 61.0 12/16/2017   CHOLHDL 2 12/16/2017   Lab Results  Component Value Date   ALT 17 12/16/2017   AST 17 12/16/2017   ALKPHOS 60 12/16/2017   BILITOT 0.6 12/16/2017  Relevant Orders   Lipid panel (Completed)   Vitamin D deficiency - Primary   Relevant Orders   VITAMIN D 25 Hydroxy (Vit-D Deficiency, Fractures) (Completed)   Insomnia secondary to anxiety    Situational, aggravated by Junior's declining health status. Lorazepam refill given.  The risks and benefits of benzodiazepine use were discussed with patient today including excessive sedation leading to respiratory depression,  impaired thinking/driving, and addiction.  Patient was advised to avoid concurrent use with alcohol, to use medication only as needed  and not to share with others  .       Relevant Medications   LORazepam (ATIVAN) 1 MG tablet    Other Visit Diagnoses    Weight loss       Relevant Orders   Comprehensive metabolic panel (Completed)   TSH (Completed)      I have discontinued Izza A. Lesage's ergocalciferol. I have also changed her LORazepam. Additionally, I am having her maintain her aspirin, Probiotic Product (PROBIOTIC DAILY PO), atorvastatin, cholecalciferol, and COD LIVER OIL PO.  Meds ordered this encounter  Medications  . LORazepam (ATIVAN) 1 MG tablet    Sig: Take 1 tablet (1 mg total) by mouth 2 (two) times daily. As needed for insomnia and anxiety    Dispense:  30 tablet    Refill:  5    Medications Discontinued During This Encounter  Medication Reason  . atorvastatin (LIPITOR) 20 MG tablet Duplicate  . ergocalciferol (DRISDOL) 50000 units capsule Completed Course  . LORazepam (ATIVAN) 1 MG tablet Reorder    Follow-up: Return in about 6 months (around 06/18/2018).   Crecencio Mc, MD

## 2017-12-16 NOTE — Patient Instructions (Signed)
I have refilled your lorazepam.  You can use 1/2 tablet twice daily as needed.  If you need more than that, please let me know so we can switch you to a safer medication   Tell Kimberly Schmitt that  I and my church are praying for him and  You

## 2017-12-18 DIAGNOSIS — F5105 Insomnia due to other mental disorder: Secondary | ICD-10-CM

## 2017-12-18 DIAGNOSIS — F419 Anxiety disorder, unspecified: Secondary | ICD-10-CM | POA: Insufficient documentation

## 2017-12-18 NOTE — Assessment & Plan Note (Signed)
Situational, aggravated by Junior's declining health status. Lorazepam refill given.  The risks and benefits of benzodiazepine use were discussed with patient today including excessive sedation leading to respiratory depression,  impaired thinking/driving, and addiction.  Patient was advised to avoid concurrent use with alcohol, to use medication only as needed and not to share with others  .

## 2017-12-18 NOTE — Assessment & Plan Note (Signed)
LDL and triglycerides are at goal on atorvastatin 20 mg daily  She has no side effects and liver enzymes are normal. No changes today  Lab Results  Component Value Date   CHOL 142 12/16/2017   HDL 67.60 12/16/2017   LDLCALC 62 12/16/2017   LDLDIRECT 147.5 04/17/2013   TRIG 61.0 12/16/2017   CHOLHDL 2 12/16/2017   Lab Results  Component Value Date   ALT 17 12/16/2017   AST 17 12/16/2017   ALKPHOS 60 12/16/2017   BILITOT 0.6 12/16/2017

## 2018-01-11 DIAGNOSIS — Z23 Encounter for immunization: Secondary | ICD-10-CM | POA: Diagnosis not present

## 2018-01-12 ENCOUNTER — Telehealth: Payer: Self-pay | Admitting: *Deleted

## 2018-01-12 MED ORDER — PAROXETINE HCL 10 MG PO TABS: 10.0000 mg | ORAL_TABLET | Freq: Every day | ORAL | 5 refills | Status: DC

## 2018-01-12 NOTE — Telephone Encounter (Signed)
Patient notified and voiced understanding to directions for medication changes , patient will call back for FU appointment right she has no schedule.

## 2018-01-12 NOTE — Telephone Encounter (Signed)
Patient is asking for something a little milder than the Ativan to help cope with patient husbands illness is getting worse and she concerned that the Ativan maybe to addictive taking 1 tablet at night and a half tablet during the day seems to help ok but just scared of the medication being addictive and wanted PCP opinion as to should she take something else.

## 2018-01-12 NOTE — Telephone Encounter (Signed)
recomending a trial of generic paxil  Please start the paxil at 1/2 tablet daily in the evening for the first few days to avoid nausea.  You can increase to a full tablet after 4 days if you havenot developed side effects of nausea.  If the paxil interferes with your sleep, take it in the morning instead  Please start reducing the ativan gradually by reducing each dose by 1/2 for the next week,  And stop the daytime dose after 2 weeks , she can continue half of the evening dose if needed    needs a visit for F/U one month

## 2018-01-12 NOTE — Telephone Encounter (Signed)
Copied from [place] [id number]. Topic: General - Other >> Jan 12, 2018  8:50 AM Carolyn Stare wrote:  Pt call to say she would like to have something that is not so additive she is currently taking LORazepam (ATIVAN) 1 MG tablet and would like to have something else.Dr Derrel Nip is a ware that her pt husband is ill   [facility]

## 2018-02-04 ENCOUNTER — Other Ambulatory Visit: Payer: Self-pay | Admitting: Internal Medicine

## 2018-02-07 ENCOUNTER — Other Ambulatory Visit: Payer: Self-pay | Admitting: Internal Medicine

## 2018-02-08 ENCOUNTER — Other Ambulatory Visit: Payer: Self-pay | Admitting: Internal Medicine

## 2018-02-18 ENCOUNTER — Encounter: Payer: Self-pay | Admitting: Internal Medicine

## 2018-02-18 ENCOUNTER — Other Ambulatory Visit: Payer: Self-pay | Admitting: Internal Medicine

## 2018-02-18 ENCOUNTER — Ambulatory Visit (INDEPENDENT_AMBULATORY_CARE_PROVIDER_SITE_OTHER): Payer: Medicare Other | Admitting: Internal Medicine

## 2018-02-18 DIAGNOSIS — F411 Generalized anxiety disorder: Secondary | ICD-10-CM | POA: Diagnosis not present

## 2018-02-18 NOTE — Progress Notes (Signed)
Subjective:  Patient ID: Kimberly Schmitt, female    DOB: [birth year]  Age: 67 y.o. MRN: [id number]  CC: The encounter diagnosis was GAD (generalized anxiety disorder).  HPI [name] presents for follow up on treatment for GAD with paxil  Which was started one month ago.  Patient's husband , "Junior"  Is battling metastatic thyroid cancer, and Hospice/Palliative care has been called in .  She states that the Paxil has helped tremendously with regard to her anxiety and reactions.  She is still working,  But is able to leave early if needed,  Occasional insomnia managed with prn lorazepam not mor e than 1-2 times per week .  Still losing weight but less rapidly,   Diet reviewed.   Outpatient Medications Prior to Visit  Medication Sig Dispense Refill  . aspirin 81 MG tablet Take 81 mg by mouth daily.    Marland Kitchen atorvastatin (LIPITOR) 20 MG tablet TAKE 1 TABLET (20 MG TOTAL) BY MOUTH DAILY. 90 tablet 1  . cholecalciferol (VITAMIN D) 1000 units tablet Take 1,000 Units by mouth daily.    . COD LIVER OIL PO Take 1 capsule by mouth daily.    Marland Kitchen LORazepam (ATIVAN) 1 MG tablet Take 1 tablet (1 mg total) by mouth 2 (two) times daily. As needed for insomnia and anxiety 30 tablet 5  . PARoxetine (PAXIL) 10 MG tablet TAKE 1 TABLET (10 MG TOTAL) BY MOUTH DAILY. AFTER DINNER 90 tablet 2  . Probiotic Product (PROBIOTIC DAILY PO) Take by mouth.     No facility-administered medications prior to visit.     Review of Systems;  Patient denies headache, fevers, malaise, unintentional weight loss, skin rash, eye pain, sinus congestion and sinus pain, sore throat, dysphagia,  hemoptysis , cough, dyspnea, wheezing, chest pain, palpitations, orthopnea, edema, abdominal pain, nausea, melena, diarrhea, constipation, flank pain, dysuria, hematuria, urinary  Frequency, nocturia, numbness, tingling, seizures,  Focal weakness, Loss of consciousness,  Tremor, insomnia, depression, anxiety, and suicidal ideation.       Objective:  BP 116/80 (BP Location: Left Arm, Patient Position: Sitting, Cuff Size: Normal)   Pulse 94   Temp 97.8 F (36.6 C) (Oral)   Wt 101 lb 12.8 oz (46.2 kg)   BMI 20.22 kg/m   BP Readings from Last 3 Encounters:  02/18/18 116/80  12/16/17 112/74  06/16/17 106/64    Wt Readings from Last 3 Encounters:  02/18/18 101 lb 12.8 oz (46.2 kg)  12/16/17 103 lb 6.4 oz (46.9 kg)  07/14/17 113 lb (51.3 kg)    General appearance: alert, cooperative and appears stated age Ears: normal TM's and external ear canals both ears Throat: lips, mucosa, and tongue normal; teeth and gums normal Neck: no adenopathy, no carotid bruit, supple, symmetrical, trachea midline and thyroid not enlarged, symmetric, no tenderness/mass/nodules Back: symmetric, no curvature. ROM normal. No CVA tenderness. Lungs: clear to auscultation bilaterally Heart: regular rate and rhythm, S1, S2 normal, no murmur, click, rub or gallop Abdomen: soft, non-tender; bowel sounds normal; no masses,  no organomegaly Pulses: 2+ and symmetric Skin: Skin color, texture, turgor normal. No rashes or lesions Lymph nodes: Cervical, supraclavicular, and axillary nodes normal.  No results found for: HGBA1C  Lab Results  Component Value Date   CREATININE 0.78 12/16/2017   CREATININE 0.80 06/16/2017   CREATININE 0.74 12/04/2016    Lab Results  Component Value Date   WBC 10.6 (H) 05/13/2016   HGB 15.0 05/13/2016   HCT 45.0 05/13/2016   PLT  350.0 05/13/2016   GLUCOSE 99 12/16/2017   CHOL 142 12/16/2017   TRIG 61.0 12/16/2017   HDL 67.60 12/16/2017   LDLDIRECT 147.5 04/17/2013   LDLCALC 62 12/16/2017   ALT 17 12/16/2017   AST 17 12/16/2017   NA 142 12/16/2017   K 4.4 12/16/2017   CL 105 12/16/2017   CREATININE 0.78 12/16/2017   BUN 15 12/16/2017   CO2 30 12/16/2017   TSH 0.84 12/16/2017    Assessment & Plan:   Problem List Items Addressed This Visit    GAD (generalized anxiety disorder)    Secondary to  husband's battle with metastatic  Cancer. improved with low dose paxil.  She is sleeping better,  Handling the stress with less episodes of panic and tearfulness. No changes today         A total of 25 minutes of face to face time was spent with patient more than half of which was spent in counselling about the above mentioned conditions  and coordination of care  I am having Marizol A. Messineo maintain her aspirin, Probiotic Product (PROBIOTIC DAILY PO), cholecalciferol, COD LIVER OIL PO, LORazepam, PARoxetine, and atorvastatin.  No orders of the defined types were placed in this encounter.   There are no discontinued medications.  Follow-up: No follow-ups on file.   Crecencio Mc, MD

## 2018-02-20 DIAGNOSIS — F411 Generalized anxiety disorder: Secondary | ICD-10-CM | POA: Insufficient documentation

## 2018-02-20 NOTE — Assessment & Plan Note (Signed)
Secondary to husband's battle with metastatic  Cancer. improved with low dose paxil.  She is sleeping better,  Handling the stress with less episodes of panic and tearfulness. No changes today

## 2018-03-16 DIAGNOSIS — H2511 Age-related nuclear cataract, right eye: Secondary | ICD-10-CM | POA: Diagnosis not present

## 2018-03-23 ENCOUNTER — Ambulatory Visit: Payer: Self-pay

## 2018-03-23 NOTE — Telephone Encounter (Signed)
Pt c/o nosebleed. Pt had the first one yesterday morning and 2 more at 4:00 am and 7:00 am this morning. Pt stated it took 30 minutes and many tissues to get the bleeding to stop. Pt stated that when she had the nosebleeds she leaned over a sink and let it bleed. Pt denies any cold symptoms, denies high blood pressure or any bleeding problems. Pt denies rubbing or picking her nose. Pt takes aspirin 81 mg every day. Pt denies lightheadedness. Care advice given and pt verbalized understanding. No available appointments with Dr Derrel Nip. Pt given appointment with Philis Nettle NP for tomorrow morning at 8:00 am.   Reason for Disposition . [1] Bleeding recurs 3 or more times in 24 hours AND [2] direct pressure applied correctly  Answer Assessment - Initial Assessment Questions 1. AMOUNT OF BLEEDING: "How bad is the bleeding?" "How much blood was lost?" "Has the bleeding stopped?"   - MILD: needed a couple tissues   - MODERATE: needed many tissues   - SEVERE: large blood clots, soaked many tissues, lasted more than 30 minutes      severe 2. ONSET: "When did the nosebleed start?"      Yesterday am  3. FREQUENCY: "How many nosebleeds have you had in the last 24 hours?"      3 4. RECURRENT SYMPTOMS: "Have there been other recent nosebleeds?" If so, ask: "How long did it take you to stop the bleeding?" "What worked best?"      No-20 to 30 minutes-held head over sink and let it bleed  5. CAUSE: "What do you think caused this nosebleed?"    Pt doesn't know 6. LOCAL FACTORS: "Do you have any cold symptoms?", "Have you been rubbing or picking at your nose?"     No-no 7. SYSTEMIC FACTORS: "Do you have high blood pressure or any bleeding problems?"     no 8. BLOOD THINNERS: "Do you take any blood thinners?" (e.g., coumadin, heparin, aspirin, Plavix)     Aspirin 81 mg 9. OTHER SYMPTOMS: "Do you have any other symptoms?" (e.g., lightheadedness)     no 10. PREGNANCY: "Is there any chance you are pregnant?" "When  was your last menstrual period?"       n/a  Protocols used: ENIDPOEUM-P-NT

## 2018-03-24 ENCOUNTER — Ambulatory Visit (INDEPENDENT_AMBULATORY_CARE_PROVIDER_SITE_OTHER): Payer: Medicare Other | Admitting: Family Medicine

## 2018-03-24 ENCOUNTER — Encounter: Payer: Self-pay | Admitting: Family Medicine

## 2018-03-24 VITALS — BP 140/80 | HR 105 | Temp 97.7°F | Ht 59.0 in | Wt 106.4 lb

## 2018-03-24 DIAGNOSIS — R040 Epistaxis: Secondary | ICD-10-CM

## 2018-03-24 NOTE — Patient Instructions (Signed)
Keep nose moist with vaseline, apply some on q-tip and rub in nares  If nose bleed occurs put afrin on a cotton ball or gauze and put up into nares to stop the bleed  Nosebleed, Adult A nosebleed is when blood comes out of the nose. Nosebleeds are common. Usually, they are not a sign of a serious condition. Nosebleeds can happen if a small blood vessel in your nose starts to bleed or if the lining of your nose (mucous membrane) cracks. They are commonly caused by:  Allergies.  Colds.  Picking your nose.  Blowing your nose too hard.  An injury from sticking an object into your nose or getting hit in the nose.  Dry or cold air.  Less common causes of nosebleeds include:  Toxic fumes.  Something abnormal in the nose or in the air-filled spaces in the bones of the face (sinuses).  Growths in the nose, such as polyps.  Medicines or conditions that cause blood to clot slowly.  Certain illnesses or procedures that irritate or dry out the nasal passages.  Follow these instructions at home: When you have a nosebleed:  Sit down and tilt your head slightly forward.  Use a clean towel or tissue to pinch your nostrils under the bony part of your nose. After 10 minutes, let go of your nose and see if bleeding starts again. Do not release pressure before that time. If there is still bleeding, repeat the pinching and holding for 10 minutes until the bleeding stops.  Do not place tissues or gauze in the nose to stop bleeding.  Avoid lying down and avoid tilting your head backward. That may make blood collect in the throat and cause gagging or coughing.  Use a nasal spray decongestant to help with a nosebleed as told by your health care provider.  Do not use petroleum jelly or mineral oil in your nose. It can drip into your lungs. After a nosebleed:  Avoid blowing your nose or sniffing for a number of hours.  Avoid straining, lifting, or bending at the waist for several days. You may  resume other normal activities as you are able.  Use saline spray or a humidifier as told by your health care provider.  Aspirinand blood thinners make bleeding more likely. If you are prescribed these medicines and you suffer from nosebleeds: ? Ask your health care provider if you should stop taking the medicines or if you should adjust the dose. ? Do not stop taking medicines that your health care provider has recommended unless told by your health care provider.  If your nosebleed was caused by dry mucous membranes, use over-the-counter saline nasal spray or gel. This will keep the mucous membranes moist and allow them to heal. If you must use a lubricant: ? Choose one that is water-soluble. ? Use only as much as you need and use it only as often as needed. ? Do not lie down until several hours after you use it. Contact a health care provider if:  You have a fever.  You get nosebleeds often or more often than usual.  You bruise very easily.  You have a nosebleed from having something stuck in your nose.  You have bleeding in your mouth.  You vomit or cough up brown material.  You have a nosebleed after you start a new medicine. Get help right away if:  You have a nosebleed after a fall or a head injury.  Your nosebleed does not go away  after 20 minutes.  You feel dizzy or weak.  You have unusual bleeding from other parts of your body.  You have unusual bruising on other parts of your body.  You become sweaty.  You vomit blood. This information is not intended to replace advice given to you by your health care provider. Make sure you discuss any questions you have with your health care provider. Document Released: 02/04/2005 Document Revised: 12/26/2015 Document Reviewed: 11/12/2015 Elsevier Interactive Patient Education  [employer].

## 2018-03-24 NOTE — Progress Notes (Signed)
Subjective:    Patient ID: Kimberly Schmitt, female    DOB: 04-16-1951, 67 y.o.   MRN: [id number]  HPI  Presents to clinic c/o nose bleeds since 03/22/18.  Patient states it first began out of the blue, nose was bleeding all over she was able to get it stopped by applying pressure and using a paper towel.  Patient states it occurred again overnight, she was able to get stopped up again by applying pressure and using paper towel.  She called the office on Wednesday 1113, and was advised to use Afrin on either gauze or a cotton ball to help constrict the vessels in the nose and help bleeding to stop.  Patient had one more nosebleed later on in the day on Wednesday, and did the Afrin soaked gauze and this worked to stop of nosebleed.  Patient denies fever or chills.  Denies feeling dizzy or weak.  Denies feeling sweaty, or vomiting.  Denies any unusual bruising or unusual bleeding from other parts of her body.  Patient states she does have some increased stress in her life right now due to husband being on hospice, wonders if this could be contributing.  Patient Active Problem List   Diagnosis Date Noted  . GAD (generalized anxiety disorder) 02/20/2018  . Insomnia secondary to anxiety 12/18/2017  . Atherosclerosis of native coronary artery of native heart without angina pectoris 05/16/2016  . Solitary pulmonary nodule 05/16/2016  . Vitamin D deficiency 05/16/2016  . Dyspareunia 05/04/2014  . Osteopenia 05/30/2013  . Hyperlipidemia 04/21/2013  . Visit for preventive health examination 04/17/2013  . Screening for colon cancer 04/08/2011  . Screening for breast cancer 04/08/2011  . Screening for cervical cancer 04/08/2011  . Tobacco abuse 04/07/2011  . Tobacco abuse counseling 04/07/2011   Social History   Tobacco Use  . Smoking status: Current Every Day Smoker    Packs/day: 0.50    Years: 45.00    Pack years: 22.50    Types: Cigarettes  . Smokeless tobacco: Never Used  Substance  Use Topics  . Alcohol use: No    Alcohol/week: 0.0 standard drinks   Review of Systems   Constitutional: Negative for chills, fatigue and fever.  HENT: Negative for congestion, ear pain, sinus pain and sore throat. +nose bleeds  Eyes: Negative.   Respiratory: Negative for cough, shortness of breath and wheezing.   Cardiovascular: Negative for chest pain, palpitations and leg swelling.  Gastrointestinal: Negative for abdominal pain, diarrhea, nausea and vomiting.  Genitourinary: Negative for dysuria, frequency and urgency.  Musculoskeletal: Negative for arthralgias and myalgias.  Skin: Negative for color change, pallor and rash.  Neurological: Negative for syncope, light-headedness and headaches.  Psychiatric/Behavioral: The patient is not nervous/anxious.       Objective:   Physical Exam  Constitutional: She is oriented to person, place, and time. No distress.  HENT:  Head: Normocephalic and atraumatic.  Nose: No sinus tenderness, nasal deformity or nasal septal hematoma.  No foreign bodies. Right sinus exhibits no maxillary sinus tenderness and no frontal sinus tenderness. Left sinus exhibits no maxillary sinus tenderness and no frontal sinus tenderness.  No bleeding and no was noted currently, nares appear mildly swollen.  No retained blood noted in nares.  Some clear nasal discharge noted.  Eyes: Conjunctivae and EOM are normal. No scleral icterus.  Neck: Neck supple. No tracheal deviation present.  Cardiovascular: Regular rhythm and normal heart sounds.  Pulmonary/Chest: Effort normal and breath sounds normal.  Neurological: She is alert  and oriented to person, place, and time.  Skin: Skin is warm and dry. She is not diaphoretic. No erythema. No pallor.  Psychiatric: She has a normal mood and affect. Her behavior is normal.  Nursing note and vitals reviewed.     Vitals:   03/24/18 0807  BP: 140/80  Pulse: (!) 105  Temp: 97.7 F (36.5 C)  SpO2: 98%   Assessment & Plan:     Bleeding from the nose- patient advised that if nosebleed recurs she can use the Afrin on either gauze or a cotton ball to put up into nose to help constrict vessels and stop bleeding.  Patient advised that if bleeding persists for longer than 20 minutes she should call office and or go to emergency room right away for evaluation.  Patient also advised to put small amount of Vaseline on a Q-tip, use this to moisten inside of nose to help keep skin from being too dry or cracked.  Patient also aware that if nosebleeds continue to occur, we can consider ENT referral for further evaluation.  I offered to check CBC in clinic today, but patient declines  Keep regularly scheduled follow-up with PCP as planned.  Return to clinic sooner if issues arise.

## 2018-06-22 ENCOUNTER — Encounter: Payer: Self-pay | Admitting: Internal Medicine

## 2018-06-22 ENCOUNTER — Ambulatory Visit (INDEPENDENT_AMBULATORY_CARE_PROVIDER_SITE_OTHER): Payer: Medicare Other | Admitting: Internal Medicine

## 2018-06-22 VITALS — BP 112/78 | HR 93 | Temp 98.3°F | Resp 16 | Ht 59.25 in | Wt 103.4 lb

## 2018-06-22 DIAGNOSIS — Z0000 Encounter for general adult medical examination without abnormal findings: Secondary | ICD-10-CM

## 2018-06-22 DIAGNOSIS — R5383 Other fatigue: Secondary | ICD-10-CM | POA: Diagnosis not present

## 2018-06-22 DIAGNOSIS — F411 Generalized anxiety disorder: Secondary | ICD-10-CM | POA: Diagnosis not present

## 2018-06-22 DIAGNOSIS — Z0001 Encounter for general adult medical examination with abnormal findings: Secondary | ICD-10-CM

## 2018-06-22 DIAGNOSIS — F5105 Insomnia due to other mental disorder: Secondary | ICD-10-CM

## 2018-06-22 DIAGNOSIS — E7800 Pure hypercholesterolemia, unspecified: Secondary | ICD-10-CM | POA: Diagnosis not present

## 2018-06-22 DIAGNOSIS — F419 Anxiety disorder, unspecified: Secondary | ICD-10-CM

## 2018-06-22 DIAGNOSIS — N6459 Other signs and symptoms in breast: Secondary | ICD-10-CM | POA: Diagnosis not present

## 2018-06-22 LAB — COMPREHENSIVE METABOLIC PANEL
ALT: 21 U/L (ref 0–35)
AST: 22 U/L (ref 0–37)
Albumin: 4.7 g/dL (ref 3.5–5.2)
Alkaline Phosphatase: 60 U/L (ref 39–117)
BILIRUBIN TOTAL: 0.6 mg/dL (ref 0.2–1.2)
BUN: 16 mg/dL (ref 6–23)
CO2: 28 meq/L (ref 19–32)
Calcium: 10 mg/dL (ref 8.4–10.5)
Chloride: 105 mEq/L (ref 96–112)
Creatinine, Ser: 0.71 mg/dL (ref 0.40–1.20)
GFR: 81.52 mL/min (ref >60.00)
Glucose, Bld: 93 mg/dL (ref 70–99)
Potassium: 4.7 mEq/L (ref 3.5–5.1)
SODIUM: 143 meq/L (ref 135–145)
TOTAL PROTEIN: 6.8 g/dL (ref 6.0–8.3)

## 2018-06-22 LAB — LIPID PANEL
CHOL/HDL RATIO: 2
CHOLESTEROL: 170 mg/dL (ref 0–200)
HDL: 71 mg/dL (ref >39.00)
LDL CALC: 86 mg/dL (ref 0–99)
NonHDL: 99.26
TRIGLYCERIDES: 68 mg/dL (ref 0.0–149.0)
VLDL: 13.6 mg/dL (ref 0.0–40.0)

## 2018-06-22 LAB — TSH: TSH: 0.81 u[IU]/mL (ref 0.35–4.50)

## 2018-06-22 NOTE — Progress Notes (Signed)
Patient ID: Kimberly Schmitt, female    DOB: 10-18-1950  Age: 68 y.o. MRN: [id number]  The patient is here for follow up and and management of other chronic and acute problems.   The risk factors are reflected in the social history.  The roster of all physicians providing medical care to patient - is listed in the Snapshot section of the chart.  Activities of daily living:  The patient is 100% independent in all ADLs: dressing, toileting, feeding as well as independent mobility  Home safety : The patient has smoke detectors in the home. They wear seatbelts.  There are no firearms at home. There is no violence in the home.   There is no risks for hepatitis, STDs or HIV. There is no   history of blood transfusion. They have no travel history to infectious disease endemic areas of the world.  The patient has seen their dentist in the last six month. They have seen their eye doctor in the last year. They admit to slight hearing difficulty with regard to whispered voices and some television programs.  They have deferred audiologic testing in the last year.  They do not  have excessive sun exposure. Discussed the need for sun protection: hats, long sleeves and use of sunscreen if there is significant sun exposure.   Diet: the importance of a healthy diet is discussed. They do have a healthy diet.  The benefits of regular aerobic exercise were discussed. She walks 4 times per week ,  20 minutes.   Depression screen: there are no signs or vegative symptoms of depression- irritability, change in appetite, anhedonia, sadness/tearfullness.  Cognitive assessment: the patient manages all their financial and personal affairs and is actively engaged. They could relate day,date,year and events; recalled 2/3 objects at 3 minutes; performed clock-face test normally.  The following portions of the patient's history were reviewed and updated as appropriate: allergies, current medications, past family history, past  medical history,  past surgical history, past social history  and problem list.  Visual acuity was not assessed per patient preference since she has regular follow up with her ophthalmologist. Hearing and body mass index were assessed and reviewed.   During the course of the visit the patient was educated and counseled about appropriate screening and preventive services including : fall prevention , diabetes screening, nutrition counseling, colorectal cancer screening, and recommended immunizations.    CC: The primary encounter diagnosis was Pure hypercholesterolemia. Diagnoses of Fatigue, unspecified type, Abnormal breast exam, Visit for preventive health examination, Encounter for preventative adult health care exam with abnormal findings, Insomnia secondary to anxiety, and GAD (generalized anxiety disorder) were also pertinent to this visit.  follow up on GAD aggravated by husband's battle with cancer  , now on hospice  Epistaxis  X 4  Spontaneous, none since  Nov 14 using saline mist.  No steroids.  Not sleeping much. Still working part time  Despite husband's decline.  Needs to work some to help relieve her stress.     [name] has a past medical history of Hyperlipidemia, Personal history of tobacco use, presenting hazards to health (06/12/2015), and Visit for preventive health examination (04/17/2013).   She has a past surgical history that includes Appendectomy (1968).   Her family history includes Breast cancer (age of onset: 69) in her maternal grandmother; Cancer in her maternal grandmother; Cancer (age of onset: 4) in her brother; Cancer (age of onset: 42) in her father; Diabetes in her sister; Stroke in her  maternal aunt.She reports that she has been smoking cigarettes. She has a 22.50 pack-year smoking history. She has never used smokeless tobacco. She reports that she does not drink alcohol or use drugs.  Outpatient Medications Prior to Visit  Medication Sig Dispense Refill   . aspirin 81 MG tablet Take 81 mg by mouth daily.    Marland Kitchen atorvastatin (LIPITOR) 20 MG tablet TAKE 1 TABLET (20 MG TOTAL) BY MOUTH DAILY. 90 tablet 1  . cholecalciferol (VITAMIN D) 1000 units tablet Take 1,000 Units by mouth daily.    . COD LIVER OIL PO Take 1 capsule by mouth daily.    Marland Kitchen LORazepam (ATIVAN) 1 MG tablet Take 1 tablet (1 mg total) by mouth 2 (two) times daily. As needed for insomnia and anxiety 30 tablet 5  . PARoxetine (PAXIL) 10 MG tablet TAKE 1 TABLET (10 MG TOTAL) BY MOUTH DAILY. AFTER DINNER 90 tablet 2  . Probiotic Product (PROBIOTIC DAILY PO) Take by mouth.    Marland Kitchen atorvastatin (LIPITOR) 20 MG tablet TAKE 1 TABLET (20 MG TOTAL) BY MOUTH DAILY. (Patient not taking: Reported on 06/22/2018) 90 tablet 1   No facility-administered medications prior to visit.     Review of Systems  Patient denies headache, fevers, malaise, unintentional weight loss, skin rash, eye pain, sinus congestion and sinus pain, sore throat, dysphagia,  hemoptysis , cough, dyspnea, wheezing, chest pain, palpitations, orthopnea, edema, abdominal pain, nausea, melena, diarrhea, constipation, flank pain, dysuria, hematuria, urinary  Frequency, nocturia, numbness, tingling, seizures,  Focal weakness, Loss of consciousness,  Tremor,depression,  and suicidal ideation.     Objective:  BP 112/78 (BP Location: Left Arm, Patient Position: Sitting, Cuff Size: Normal)   Pulse 93   Temp 98.3 F (36.8 C) (Oral)   Resp 16   Ht 4' 11.25" (1.505 m)   Wt 103 lb 6.4 oz (46.9 kg)   SpO2 96%   BMI 20.71 kg/m   Physical Exam  General appearance: alert, cooperative and appears stated age Head: Normocephalic, without obvious abnormality, atraumatic Eyes: conjunctivae/corneas clear. PERRL, EOM's intact. Fundi benign. Ears: normal TM's and external ear canals both ears Nose: Nares normal. Septum midline. Mucosa normal. No drainage or sinus tenderness. Throat: lips, mucosa, and tongue normal; teeth and gums normal Neck:  no adenopathy, no carotid bruit, no JVD, supple, symmetrical, trachea midline and thyroid not enlarged, symmetric, no tenderness/mass/nodules Lungs: clear to auscultation bilaterally Breasts: right breast normal.  Left breast with pea sized nodule at 12:00 position abutting nipple  Heart: regular rate and rhythm, S1, S2 normal, no murmur, click, rub or gallop Abdomen: soft, non-tender; bowel sounds normal; no masses,  no organomegaly Extremities: extremities normal, atraumatic, no cyanosis or edema Pulses: 2+ and symmetric Skin: Skin color, texture, turgor normal. No rashes or lesions Neurologic: Alert and oriented X 3, normal strength and tone. Normal symmetric reflexes. Normal coordination and gait.    Assessment & Plan:   Problem List Items Addressed This Visit    RESOLVED: Visit for preventive health examination    age appropriate education and counseling updated, referrals for preventative services and immunizations addressed, dietary and smoking counseling addressed, most recent labs reviewed.  I have personally reviewed and have noted:  1) the patient's medical and social history 2) The pt's use of alcohol, tobacco, and illicit drugs 3) The patient's current medications and supplements 4) Functional ability including ADL's, fall risk, home safety risk, hearing and visual impairment 5) Diet and physical activities 6) Evidence for depression or mood disorder  7) The patient's height, weight, and BMI have been recorded in the chart  I have made referrals, and provided counseling and education based on review of the above      Insomnia secondary to anxiety    Situational, aggravated by Junior's declining health status. Lorazepam refill given.  The risks and benefits of benzodiazepine use were discussed with patient today including increased risk of dementia , excessive sedation leading to respiratory depression,  impaired thinking/driving, and addiction.  Patient was advised to avoid  concurrent use with alcohol, to use medication only as needed and not to share with others  .       Hyperlipidemia - Primary    She is tolerating  atorvastatin 20 mg daily  She has no side effects and liver enzymes are normal. No changes today  Lab Results  Component Value Date   CHOL 170 06/22/2018   HDL 71.00 06/22/2018   LDLCALC 86 06/22/2018   LDLDIRECT 147.5 04/17/2013   TRIG 68.0 06/22/2018   CHOLHDL 2 06/22/2018   Lab Results  Component Value Date   ALT 21 06/22/2018   AST 22 06/22/2018   ALKPHOS 60 06/22/2018   BILITOT 0.6 06/22/2018         Relevant Orders   Lipid panel (Completed)   GAD (generalized anxiety disorder)    Secondary to husband's battle with metastatic  Cancer.  Anxiety worsening due to his decline.  encouraged to increase her dose of  paxil.  She is sleeping less, and handling the stress with rare episodes of panic and tearfulness. No changes today       Encounter for preventative adult health care exam with abnormal findings    Abnormal breast exam.  Diagnostic imaging ordered of left breast        Other Visit Diagnoses    Fatigue, unspecified type       Relevant Orders   Comprehensive metabolic panel (Completed)   TSH (Completed)   Abnormal breast exam       Relevant Orders   MM Digital Diagnostic Bilat   US BREAST COMPLETE UNI LEFT INC AXILLA     A total of 40 minutes was spent with patient more than half of which was spent in counseling patient on the above mentioned issues , reviewing and explaining recent labs and imaging studies done, and coordination of care. I am having Kimberly Schmitt maintain her aspirin, Probiotic Product (PROBIOTIC DAILY PO), cholecalciferol, COD LIVER OIL PO, LORazepam, PARoxetine, and atorvastatin.  No orders of the defined types were placed in this encounter.   Medications Discontinued During This Encounter  Medication Reason  . atorvastatin (LIPITOR) 20 MG tablet Duplicate    Follow-up: Return in  about 6 months (around 12/21/2018).   Crecencio Mc, MD

## 2018-06-22 NOTE — Patient Instructions (Signed)
You are welcome to increase your paxil dose to 20 mg daily if your anxiety becomes more severe.  Just let me know so I can refill at the right dose  Diagnostic mammogram ordered.   Health Maintenance After Age 70 After age 53, you are at a higher risk for certain long-term diseases and infections as well as injuries from falls. Falls are a major cause of broken bones and head injuries in people who are older than age 70. Getting regular preventive care can help to keep you healthy and well. Preventive care includes getting regular testing and making lifestyle changes as recommended by your health care provider. Talk with your health care provider about:  Which screenings and tests you should have. A screening is a test that checks for a disease when you have no symptoms.  A diet and exercise plan that is right for you. What should I know about screenings and tests to prevent falls? Screening and testing are the best ways to find a health problem early. Early diagnosis and treatment give you the best chance of managing medical conditions that are common after age 21. Certain conditions and lifestyle choices may make you more likely to have a fall. Your health care provider may recommend:  Regular vision checks. Poor vision and conditions such as cataracts can make you more likely to have a fall. If you wear glasses, make sure to get your prescription updated if your vision changes.  Medicine review. Work with your health care provider to regularly review all of the medicines you are taking, including over-the-counter medicines. Ask your health care provider about any side effects that may make you more likely to have a fall. Tell your health care provider if any medicines that you take make you feel dizzy or sleepy.  Osteoporosis screening. Osteoporosis is a condition that causes the bones to get weaker. This can make the bones weak and cause them to break more easily.  Blood pressure screening.  Blood pressure changes and medicines to control blood pressure can make you feel dizzy.  Strength and balance checks. Your health care provider may recommend certain tests to check your strength and balance while standing, walking, or changing positions.  Foot health exam. Foot pain and numbness, as well as not wearing proper footwear, can make you more likely to have a fall.  Depression screening. You may be more likely to have a fall if you have a fear of falling, feel emotionally low, or feel unable to do activities that you used to do.  Alcohol use screening. Using too much alcohol can affect your balance and may make you more likely to have a fall. What actions can I take to lower my risk of falls? General instructions  Talk with your health care provider about your risks for falling. Tell your health care provider if: ? You fall. Be sure to tell your health care provider about all falls, even ones that seem minor. ? You feel dizzy, sleepy, or off-balance.  Take over-the-counter and prescription medicines only as told by your health care provider. These include any supplements.  Eat a healthy diet and maintain a healthy weight. A healthy diet includes low-fat dairy products, low-fat (lean) meats, and fiber from whole grains, beans, and lots of fruits and vegetables. Home safety  Remove any tripping hazards, such as rugs, cords, and clutter.  Install safety equipment such as grab bars in bathrooms and safety rails on stairs.  Keep rooms and walkways well-lit. Activity  Follow a regular exercise program to stay fit. This will help you maintain your balance. Ask your health care provider what types of exercise are appropriate for you.  If you need a cane or walker, use it as recommended by your health care provider.  Wear supportive shoes that have nonskid soles. Lifestyle  Do not drink alcohol if your health care provider tells you not to drink.  If you drink alcohol, limit  how much you have: ? 0-1 drink a day for women. ? 0-2 drinks a day for men.  Be aware of how much alcohol is in your drink. In the [place] one drink equals one typical bottle of beer (12 oz), one-half glass of wine (5 oz), or one shot of hard liquor (1 oz).  Do not use any products that contain nicotine or tobacco, such as cigarettes and e-cigarettes. If you need help quitting, ask your health care provider. Summary  Having a healthy lifestyle and getting preventive care can help to protect your health and wellness after age 54.  Screening and testing are the best way to find a health problem early and help you avoid having a fall. Early diagnosis and treatment give you the best chance for managing medical conditions that are more common for people who are older than age 96.  Falls are a major cause of broken bones and head injuries in people who are older than age 75. Take precautions to prevent a fall at home.  Work with your health care provider to learn what changes you can make to improve your health and wellness and to prevent falls. This information is not intended to replace advice given to you by your health care provider. Make sure you discuss any questions you have with your health care provider. Document Released: 03/10/2017 Document Revised: 03/10/2017 Document Reviewed: 03/10/2017 Elsevier Interactive Patient Education  2019 [employer].

## 2018-06-23 ENCOUNTER — Telehealth: Payer: Self-pay

## 2018-06-23 ENCOUNTER — Encounter: Payer: Self-pay | Admitting: Internal Medicine

## 2018-06-23 DIAGNOSIS — N6459 Other signs and symptoms in breast: Secondary | ICD-10-CM

## 2018-06-23 NOTE — Assessment & Plan Note (Signed)
She is tolerating  atorvastatin 20 mg daily  She has no side effects and liver enzymes are normal. No changes today  Lab Results  Component Value Date   CHOL 170 06/22/2018   HDL 71.00 06/22/2018   LDLCALC 86 06/22/2018   LDLDIRECT 147.5 04/17/2013   TRIG 68.0 06/22/2018   CHOLHDL 2 06/22/2018   Lab Results  Component Value Date   ALT 21 06/22/2018   AST 22 06/22/2018   ALKPHOS 60 06/22/2018   BILITOT 0.6 06/22/2018

## 2018-06-23 NOTE — Assessment & Plan Note (Signed)

## 2018-06-23 NOTE — Assessment & Plan Note (Addendum)
Situational, aggravated by Junior's declining health status. Lorazepam refill given.  The risks and benefits of benzodiazepine use were discussed with patient today including increased risk of dementia , excessive sedation leading to respiratory depression,  impaired thinking/driving, and addiction.  Patient was advised to avoid concurrent use with alcohol, to use medication only as needed and not to share with others  .

## 2018-06-23 NOTE — Assessment & Plan Note (Signed)
Secondary to husband's battle with metastatic  Cancer.  Anxiety worsening due to his decline.  encouraged to increase her dose of  paxil.  She is sleeping less, and handling the stress with rare episodes of panic and tearfulness. No changes today

## 2018-06-23 NOTE — Assessment & Plan Note (Signed)
Abnormal breast exam.  Diagnostic imaging ordered of left breast

## 2018-06-23 NOTE — Telephone Encounter (Signed)
Orders corrected. 

## 2018-06-24 ENCOUNTER — Telehealth: Payer: Self-pay

## 2018-06-24 NOTE — Telephone Encounter (Signed)
Copied from [place] [id number]. Topic: General - Inquiry >> Jun 24, 2018  1:27 PM Conception Chancy, NT wrote: Reason for CRM: patient is returning a call to the office. Melissa was unavailable and was told she would call patient back. Please advise.

## 2018-06-27 ENCOUNTER — Telehealth: Payer: Self-pay

## 2018-06-27 NOTE — Telephone Encounter (Signed)
Copied from [place] [id number]. Topic: General - Other >> Jun 27, 2018 12:38 PM Kimberly Schmitt wrote: Reason for CRM: Pt wanted to let Janett Billow know that she is back and home and to give her a call.

## 2018-06-27 NOTE — Telephone Encounter (Signed)
See result note message 

## 2018-07-01 ENCOUNTER — Ambulatory Visit
Admission: RE | Admit: 2018-07-01 | Discharge: 2018-07-01 | Disposition: A | Discharge status: Home / Self Care | Payer: Medicare Other | Source: Ambulatory Visit | Attending: Internal Medicine | Admitting: Internal Medicine

## 2018-07-01 DIAGNOSIS — N6459 Other signs and symptoms in breast: Secondary | ICD-10-CM

## 2018-07-01 DIAGNOSIS — R922 Inconclusive mammogram: Secondary | ICD-10-CM | POA: Diagnosis not present

## 2018-07-01 DIAGNOSIS — N6002 Solitary cyst of left breast: Secondary | ICD-10-CM | POA: Diagnosis not present

## 2018-07-06 ENCOUNTER — Telehealth: Payer: Self-pay | Admitting: *Deleted

## 2018-07-06 NOTE — Telephone Encounter (Signed)
Left message for patient to notify them that it is time to schedule annual low dose lung cancer screening CT scan. Instructed patient to call back to verify information prior to the scan being scheduled.  

## 2018-07-07 ENCOUNTER — Telehealth: Payer: Self-pay | Admitting: *Deleted

## 2018-07-07 NOTE — Telephone Encounter (Signed)
Contacted patient in attempt to schedule annual lung screening scan. Patient reports husband is receiving hospice care and she would like to have lung screening scan at a later date.

## 2018-07-27 DIAGNOSIS — D2272 Melanocytic nevi of left lower limb, including hip: Secondary | ICD-10-CM | POA: Diagnosis not present

## 2018-07-27 DIAGNOSIS — D2261 Melanocytic nevi of right upper limb, including shoulder: Secondary | ICD-10-CM | POA: Diagnosis not present

## 2018-07-27 DIAGNOSIS — D225 Melanocytic nevi of trunk: Secondary | ICD-10-CM | POA: Diagnosis not present

## 2018-07-27 DIAGNOSIS — D2262 Melanocytic nevi of left upper limb, including shoulder: Secondary | ICD-10-CM | POA: Diagnosis not present

## 2018-07-27 DIAGNOSIS — D2271 Melanocytic nevi of right lower limb, including hip: Secondary | ICD-10-CM | POA: Diagnosis not present

## 2018-07-27 DIAGNOSIS — L821 Other seborrheic keratosis: Secondary | ICD-10-CM | POA: Diagnosis not present

## 2018-07-28 ENCOUNTER — Encounter: Payer: Self-pay | Admitting: *Deleted

## 2018-07-30 ENCOUNTER — Encounter: Payer: Self-pay | Admitting: *Deleted

## 2018-10-28 ENCOUNTER — Telehealth: Payer: Self-pay

## 2018-10-28 NOTE — Telephone Encounter (Signed)
Call pt regarding lung screening. Pt wants to hold off scheduling. PT husband in hospice.

## 2018-11-04 ENCOUNTER — Other Ambulatory Visit: Payer: Self-pay | Admitting: Internal Medicine

## 2018-11-09 ENCOUNTER — Other Ambulatory Visit: Payer: Self-pay | Admitting: Internal Medicine

## 2018-11-10 NOTE — Telephone Encounter (Signed)
Refilled: 12/16/2017 Last OV: 06/22/2018 Next OV: 12/28/2018

## 2018-11-29 ENCOUNTER — Ambulatory Visit (INDEPENDENT_AMBULATORY_CARE_PROVIDER_SITE_OTHER): Payer: Medicare Other

## 2018-11-29 DIAGNOSIS — Z0000 Encounter for general adult medical examination without abnormal findings: Secondary | ICD-10-CM | POA: Diagnosis not present

## 2018-11-29 NOTE — Progress Notes (Signed)
Subjective:   Kimberly Schmitt is a 68 y.o. female who presents for an Initial Medicare Annual Wellness Visit.  Review of Systems   No ROS.  Medicare Wellness Virtual Visit.  Visual/audio telehealth visit, UTA vital signs.   See social history for additional risk factors.   Cardiac Risk Factors include: advanced age (>35men, >45 women)     Objective:    Today's Vitals   There is no height or weight on file to calculate BMI.  Advanced Directives 11/29/2018 10/04/2015  Does Patient Have a Medical Advance Directive? Yes No;Yes  Type of Paramedic of [place];Living will [place];Living will  Does patient want to make changes to medical advance directive? No - Patient declined -  Copy of [place] in Chart? No - copy requested -    Current Medications (verified) Outpatient Encounter Medications as of 11/29/2018  Medication Sig  . aspirin 81 MG tablet Take 81 mg by mouth daily.  Marland Kitchen atorvastatin (LIPITOR) 20 MG tablet TAKE 1 TABLET (20 MG TOTAL) BY MOUTH DAILY.  . cholecalciferol (VITAMIN D) 1000 units tablet Take 1,000 Units by mouth daily.  . COD LIVER OIL PO Take 1 capsule by mouth daily.  Marland Kitchen LORazepam (ATIVAN) 1 MG tablet TAKE 1 TABLET (1 MG TOTAL) BY MOUTH 2 (TWO) TIMES DAILY. AS NEEDED FOR INSOMNIA AND ANXIETY  . PARoxetine (PAXIL) 10 MG tablet TAKE 1 TABLET (10 MG TOTAL) BY MOUTH DAILY. AFTER DINNER  . Probiotic Product (PROBIOTIC DAILY PO) Take by mouth.   No facility-administered encounter medications on file as of 11/29/2018.     Allergies (verified) Patient has no known allergies.   History: Past Medical History:  Diagnosis Date  . Hyperlipidemia    220 per patient  . Personal history of tobacco use, presenting hazards to health 06/12/2015  . Visit for preventive health examination 04/17/2013   .    Past Surgical History:  Procedure Laterality Date  . APPENDECTOMY  1968   Family History  Problem  Relation Age of Onset  . Cancer Brother 81       Pancreatic and Liver cancer  . Cancer Maternal Grandmother   . Breast cancer Maternal Grandmother 55  . Cancer Father 58       colon or  lung CA  . Diabetes Sister   . Stroke Maternal Aunt    Social History   Socioeconomic History  . Marital status: Married    Spouse name: Not on file  . Number of children: Not on file  . Years of education: Not on file  . Highest education level: Not on file  Occupational History  . Not on file  Social Needs  . Financial resource strain: Not hard at all  . Food insecurity    Worry: Never true    Inability: Never true  . Transportation needs    Medical: No    Non-medical: No  Tobacco Use  . Smoking status: Current Every Day Smoker    Packs/day: 0.50    Years: 45.00    Pack years: 22.50    Types: Cigarettes  . Smokeless tobacco: Never Used  Substance and Sexual Activity  . Alcohol use: No    Alcohol/week: 0.0 standard drinks  . Drug use: No  . Sexual activity: Not on file  Lifestyle  . Physical activity    Days per week: Not on file    Minutes per session: Not on file  . Stress: Not  at all  Relationships  . Social Herbalist on phone: Not on file    Gets together: Not on file    Attends religious service: Not on file    Active member of club or organization: Not on file    Attends meetings of clubs or organizations: Not on file    Relationship status: Not on file  Other Topics Concern  . Not on file  Social History Narrative  . Not on file    Tobacco Counseling Ready to quit: Not Answered Counseling given: Not Answered   Clinical Intake:  Pre-visit preparation completed: Yes        Diabetes: No  How often do you need to have someone help you when you read instructions, pamphlets, or other written materials from your doctor or pharmacy?: 1 - Never  Interpreter Needed?: No      Activities of Daily Living In your present state of health, do you  have any difficulty performing the following activities: 11/29/2018  Hearing? N  Vision? N  Difficulty concentrating or making decisions? N  Walking or climbing stairs? N  Dressing or bathing? N  Doing errands, shopping? N  Preparing Food and eating ? N  Using the Toilet? N  In the past six months, have you accidently leaked urine? N  Do you have problems with loss of bowel control? N  Managing your Medications? N  Managing your Finances? N  Housekeeping or managing your Housekeeping? N  Some recent data might be hidden     Immunizations and Health Maintenance Immunization History  Administered Date(s) Administered  . Influenza Split 01/16/2013, 01/27/2014, 01/24/2015  . Influenza Whole 03/04/2011, 01/27/2012  . Influenza, High Dose Seasonal PF 02/03/2017, 01/11/2018  . Influenza-Unspecified 01/24/2016, 02/05/2017  . Pneumococcal Conjugate-13 05/02/2014  . Pneumococcal Polysaccharide-23 04/11/2012, 06/16/2017  . Tdap 04/07/2011  . Zoster Recombinat (Shingrix) 09/26/2017, 02/03/2018   There are no preventive care reminders to display for this patient.  Patient Care Team: Crecencio Mc, MD as PCP - General (Internal Medicine)  Indicate any recent Medical Services you may have received from other than [facility] providers in the past year (date may be approximate).     Assessment:   This is a routine wellness examination for [place].   I connected with patient 11/29/18 at  9:00 AM EDT by an audio enabled telemedicine application and verified that I am speaking with the correct person using two identifiers. Patient stated full name and DOB. Patient gave permission to continue with virtual visit. Patient's location was at home and Nurse's location was at [place] office.   Health Screenings  Mammogram - 06/2018 Colonoscopy - 04/2016 Bone Density - 05/2013 Glaucoma -none Hearing -demonstrates normal hearing during visit. Labs followed by pcp Cholesterol - 06/2018 Dental- UTD  Vision- visits within the last 12 months.  Social  Alcohol intake - no       Smoking history- current; not ready to quit Smokers in home? self Illicit drug use? none Exercise - no routine. Yard work and some walking.  Diet - protein drink daily Sexually Active -not currently BMI- discussed the importance of a healthy diet, water intake and the benefits of aerobic exercise.  Educational material provided.   Safety  Patient feels safe at home- yes Patient does have smoke detectors at home- yes Patient does wear sunscreen or protective clothing when in direct sunlight -yes Patient does wear seat belt when in a moving vehicle -yes  Covid-19 precautions and sickness  symptoms discussed.   Activities of Daily Living Patient denies needing assistance with: driving, household chores, feeding themselves, getting from bed to chair, getting to the toilet, bathing/showering, dressing, managing money, or preparing meals.  No new identified risk were noted.    Depression Screen Patient denies losing interest in daily life, feeling hopeless, or crying easily over simple problems.   Medication-taking as directed and without issues.   Fall Screen Patient denies being afraid of falling or falling in the last year.   Memory Screen Patient is alert.  Correctly identified the president of the [place], season and recall. 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  Immunizations The following Immunizations were discussed: Influenza, shingles, pneumonia, and tetanus.   Other Providers Patient Care Team: Crecencio Mc, MD as PCP - General (Internal Medicine)  Hearing/Vision screen  Hearing Screening   125Hz  250Hz  500Hz  1000Hz  2000Hz  3000Hz  4000Hz  6000Hz  8000Hz   Right ear:           Left ear:           Comments: Patient is able to hear conversational tones without difficulty.  No issues reported.  Vision Screening Comments: Wears corrective lenses Visual acuity not  assessed, virtual visit.  They have seen their ophthalmologist in the last 12 months.   Dietary issues and exercise activities discussed:    Goals      Patient Stated   . Follow up with Primary Care Provider (pt-stated)     Patient to reschedule annual CT scan and complete Follow with pcp as needed      Depression Screen PHQ 2/9 Scores 11/29/2018 06/22/2018 06/16/2017 05/13/2016 06/05/2015 05/02/2014 04/17/2013  PHQ - 2 Score 0 1 0 0 0 0 0  PHQ- 9 Score - 2 2 - - - -    Fall Risk Fall Risk  11/29/2018 06/16/2017 05/13/2016 06/05/2015 05/02/2014  Falls in the past year? 0 No No No No   Cognitive Function:     6CIT Screen 11/29/2018  What Year? 0 points  What month? 0 points  What time? 0 points  Count back from 20 0 points  Months in reverse 0 points  Repeat phrase 0 points  Total Score 0    Screening Tests Health Maintenance  Topic Date Due  . INFLUENZA VACCINE  12/10/2018  . MAMMOGRAM  07/02/2019  . TETANUS/TDAP  04/06/2021  . COLONOSCOPY  04/24/2021  . DEXA SCAN  Completed  . Hepatitis C Screening  Completed  . PNA vac Low Risk Adult  Completed     Plan:    End of life planning; Advance aging; Advanced directives discussed.  Copy of current HCPOA/Living Will requested.    I have personally reviewed and noted the following in the patient's chart:   . Medical and social history . Use of alcohol, tobacco or illicit drugs  . Current medications and supplements . Functional ability and status . Nutritional status . Physical activity . Advanced directives . List of other physicians . Hospitalizations, surgeries, and ER visits in previous 12 months . Vitals . Screenings to include cognitive, depression, and falls . Referrals and appointments  In addition, I have reviewed and discussed with patient certain preventive protocols, quality metrics, and best practice recommendations. A written personalized care plan for preventive services as well as general preventive health  recommendations were provided to patient.     Varney Biles, LPN   0/[id number]/3300

## 2018-11-29 NOTE — Patient Instructions (Addendum)
  Ms. Andrus , Thank you for taking time to come for your Medicare Wellness Visit. I appreciate your ongoing commitment to your health goals. Please review the following plan we discussed and let me know if I can assist you in the future.   These are the goals we discussed: Goals      Patient Stated   . Follow up with Primary Care Provider (pt-stated)     Patient to reschedule annual CT scan and complete Follow with pcp as needed       This is a list of the screening recommended for you and due dates:  Health Maintenance  Topic Date Due  . Flu Shot  12/10/2018  . Mammogram  07/02/2019  . Tetanus Vaccine  04/06/2021  . Colon Cancer Screening  04/24/2021  . DEXA scan (bone density measurement)  Completed  .  Hepatitis C: One time screening is recommended by Center for Disease Control  (CDC) for  adults born from [birth year] through 1967.   Completed  . Pneumonia vaccines  Completed

## 2018-12-28 ENCOUNTER — Ambulatory Visit (INDEPENDENT_AMBULATORY_CARE_PROVIDER_SITE_OTHER): Payer: Medicare Other | Admitting: Internal Medicine

## 2018-12-28 ENCOUNTER — Other Ambulatory Visit: Payer: Self-pay

## 2018-12-28 ENCOUNTER — Encounter: Payer: Self-pay | Admitting: Internal Medicine

## 2018-12-28 VITALS — BP 118/79 | Ht 59.25 in | Wt 98.0 lb

## 2018-12-28 DIAGNOSIS — F411 Generalized anxiety disorder: Secondary | ICD-10-CM | POA: Diagnosis not present

## 2018-12-28 DIAGNOSIS — R911 Solitary pulmonary nodule: Secondary | ICD-10-CM

## 2018-12-28 DIAGNOSIS — Z634 Disappearance and death of family member: Secondary | ICD-10-CM | POA: Diagnosis not present

## 2018-12-28 DIAGNOSIS — E7800 Pure hypercholesterolemia, unspecified: Secondary | ICD-10-CM | POA: Diagnosis not present

## 2018-12-28 DIAGNOSIS — Z716 Tobacco abuse counseling: Secondary | ICD-10-CM

## 2018-12-28 DIAGNOSIS — F419 Anxiety disorder, unspecified: Secondary | ICD-10-CM | POA: Diagnosis not present

## 2018-12-28 DIAGNOSIS — F5105 Insomnia due to other mental disorder: Secondary | ICD-10-CM

## 2018-12-28 NOTE — Assessment & Plan Note (Signed)
She had postponed her annual chest CT due to Junior's decline in health .  Advised to reschedule now.

## 2018-12-28 NOTE — Assessment & Plan Note (Signed)
Secondary to husband's battle with metastatic  Cancer.  Anxiety worsening due to his recent death.  encouraged to increase her dose of  paxil if needed but she declines.

## 2018-12-28 NOTE — Assessment & Plan Note (Signed)
Increased use since Junior passed away. Does not want to quis ("I;'ve been smoking for 44 ys)  But wants to cut back.   Recommended trial of wellbutrin.  She is contemplative.  5 minutes was spent discussing patient's current tobacco abuse,  Prior attempts to quit, the current and future hazards to patient's  health,  And assessing and discussing patient's  level of motivation to quit. Patient was encouraged to reschedule her annual lung CT

## 2018-12-28 NOTE — Assessment & Plan Note (Signed)
Using lorazepam 2/week .  The risks and benefits of benzodiazepine use were reviewed with patient today including excessive sedation leading to respiratory depression,  impaired thinking/driving, and addiction.  Patient was advised to avoid concurrent use with alcohol, to use medication only as needed and not to share with others  .

## 2018-12-28 NOTE — Assessment & Plan Note (Signed)
Patient is dealing with the  loss of spouse and has adequate coping skills and emotional support .  i have asked patinet to return in one month to examine for signs of unresolving grief.  

## 2018-12-28 NOTE — Progress Notes (Signed)
Telephone  Note  This visit type was conducted due to national recommendations for restrictions regarding the COVID-19 pandemic (e.g. social distancing).  This format is felt to be most appropriate for this patient at this time.  All issues noted in this document were discussed and addressed.  No physical exam was performed (except for noted visual exam findings with Video Visits).   I connected with@ on 12/28/18 at  8:00 AM EDT by  telephone and verified that I am speaking with the correct person using two identifiers. Location patient: home Location provider: work or home office Persons participating in the virtual visit: patient, provider  I discussed the limitations, risks, security and privacy concerns of performing an evaluation and management service by telephone and the availability of in person appointments. I also discussed with the patient that there may be a patient responsible charge related to this service. The patient expressed understanding and agreed to proceed.   Reason for visit: follow up  HPI:   She is grieving the loss of her husband Gwyndolyn Saxon "Junior"  To metastatic cancer . He died several weeks ago.  Unfortunately she was not with him because he started hemorrhaging and was taken away by EMS and died en route  She has increased her tobacc use to 15 cigs daily.  No motivation to quit   "just trying to get through  The day." has returned to work  8 to 3:30 ,  Then comes home and works vigorousl in the yard, has plenty of family support  Lost 5 lbs unintentionally due to decreased appetite.   Wt 98 lbs   Eats twice daily.      ROS: See pertinent positives and negatives per HPI.  Past Medical History:  Diagnosis Date  . Hyperlipidemia    220 per patient  . Personal history of tobacco use, presenting hazards to health 06/12/2015  . Visit for preventive health examination 04/17/2013   .     Past Surgical History:  Procedure Laterality Date  . APPENDECTOMY  1968     Family History  Problem Relation Age of Onset  . Cancer Brother 65       Pancreatic and Liver cancer  . Cancer Maternal Grandmother   . Breast cancer Maternal Grandmother 44  . Cancer Father 69       colon or  lung CA  . Diabetes Sister   . Stroke Maternal Aunt     SOCIAL HX: newly widowed.   reports that she has been smoking cigarettes. She has a 22.50 pack-year smoking history. She has never used smokeless tobacco. She reports that she does not drink alcohol or use drugs.   Current Outpatient Medications:  .  aspirin 81 MG tablet, Take 81 mg by mouth daily., Disp: , Rfl:  .  atorvastatin (LIPITOR) 20 MG tablet, TAKE 1 TABLET (20 MG TOTAL) BY MOUTH DAILY., Disp: 90 tablet, Rfl: 1 .  cholecalciferol (VITAMIN D) 1000 units tablet, Take 1,000 Units by mouth daily., Disp: , Rfl:  .  LORazepam (ATIVAN) 1 MG tablet, TAKE 1 TABLET (1 MG TOTAL) BY MOUTH 2 (TWO) TIMES DAILY. AS NEEDED FOR INSOMNIA AND ANXIETY, Disp: 30 tablet, Rfl: 5 .  PARoxetine (PAXIL) 10 MG tablet, TAKE 1 TABLET (10 MG TOTAL) BY MOUTH DAILY. AFTER DINNER, Disp: 90 tablet, Rfl: 2 .  Probiotic Product (PROBIOTIC DAILY PO), Take by mouth., Disp: , Rfl:   EXAM:  General impression: alert, cooperative and articulate.  No signs of being in distress  Lungs: speech is fluent sentence length suggests that patient is not short of breath and not punctuated by cough, sneezing or sniffing. Marland Kitchen   Psych: affect normal.  speech is articulate and non pressured .  Denies suicidal thoughts   ASSESSMENT AND PLAN:  Discussed the following assessment and plan:   Tobacco abuse counseling Increased use since Junior passed away. Does not want to quis ("I;'ve been smoking for 33 ys)  But wants to cut back.   Recommended trial of wellbutrin.  She is contemplative.  5 minutes was spent discussing patient's current tobacco abuse,  Prior attempts to quit, the current and future hazards to patient's  health,  And assessing and discussing  patient's  level of motivation to quit. Patient was encouraged to reschedule her annual lung CT   Insomnia secondary to anxiety Using lorazepam 2/week .  The risks and benefits of benzodiazepine use were reviewed with patient today including excessive sedation leading to respiratory depression,  impaired thinking/driving, and addiction.  Patient was advised to avoid concurrent use with alcohol, to use medication only as needed and not to share with others  .   Solitary pulmonary nodule She had postponed her annual chest CT due to Junior's decline in health .  Advised to reschedule now.   Bereavement Patient is dealing with the  loss of spouse and has adequate coping skills and emotional support .  i have asked patinet to return in one month to examine for signs of unresolving grief.   GAD (generalized anxiety disorder) Secondary to husband's battle with metastatic  Cancer.  Anxiety worsening due to his recent death.  encouraged to increase her dose of  paxil if needed but she declines.     I discussed the assessment and treatment plan with the patient. The patient was provided an opportunity to ask questions and all were answered. The patient agreed with the plan and demonstrated an understanding of the instructions.   The patient was advised to call back or seek an in-person evaluation if the symptoms worsen or if the condition fails to improve as anticipated.  I provided  25 minutes of non-face-to-face time during this encounter.   Crecencio Mc, MD

## 2019-01-17 ENCOUNTER — Telehealth: Payer: Self-pay | Admitting: *Deleted

## 2019-01-17 DIAGNOSIS — Z87891 Personal history of nicotine dependence: Secondary | ICD-10-CM

## 2019-01-17 DIAGNOSIS — Z122 Encounter for screening for malignant neoplasm of respiratory organs: Secondary | ICD-10-CM

## 2019-01-17 NOTE — Telephone Encounter (Signed)
Patient has been notified that annual lung cancer screening low dose CT scan is due currently or will be in near future. Confirmed that patient is within the age range of 55-77, and asymptomatic, (no signs or symptoms of lung cancer). Patient denies illness that would prevent curative treatment for lung cancer if found. Verified smoking history, (current, 35.5 pack year). The shared decision making visit was done 06/14/15. Patient is agreeable for CT scan being scheduled.

## 2019-01-20 DIAGNOSIS — L728 Other follicular cysts of the skin and subcutaneous tissue: Secondary | ICD-10-CM | POA: Diagnosis not present

## 2019-01-24 ENCOUNTER — Other Ambulatory Visit: Payer: Self-pay

## 2019-01-24 ENCOUNTER — Ambulatory Visit
Admission: RE | Admit: 2019-01-24 | Discharge: 2019-01-24 | Disposition: A | Discharge status: Home / Self Care | Payer: Medicare Other | Source: Ambulatory Visit | Attending: Nurse Practitioner | Admitting: Nurse Practitioner

## 2019-01-24 DIAGNOSIS — Z122 Encounter for screening for malignant neoplasm of respiratory organs: Secondary | ICD-10-CM | POA: Diagnosis not present

## 2019-01-24 DIAGNOSIS — Z87891 Personal history of nicotine dependence: Secondary | ICD-10-CM | POA: Diagnosis not present

## 2019-01-24 DIAGNOSIS — F17210 Nicotine dependence, cigarettes, uncomplicated: Secondary | ICD-10-CM | POA: Diagnosis not present

## 2019-01-26 ENCOUNTER — Telehealth: Payer: Self-pay | Admitting: *Deleted

## 2019-01-26 NOTE — Telephone Encounter (Signed)
Notified patient of LDCT lung cancer screening program results with recommendation for 12 month follow up imaging. Also notified of incidental findings noted below and is encouraged to discuss further with PCP who will receive a copy of this note and/or the CT report. Patient verbalizes understanding.   IMPRESSION: 1. Lung-RADS 2, benign appearance or behavior. Continue annual screening with low-dose chest CT without contrast in 12 months. 2. Three-vessel coronary atherosclerosis.  Aortic Atherosclerosis (ICD10-I70.0) and Emphysema (ICD10-J43.9).

## 2019-01-29 ENCOUNTER — Other Ambulatory Visit: Payer: Self-pay | Admitting: Internal Medicine

## 2019-02-07 ENCOUNTER — Telehealth: Payer: Self-pay

## 2019-02-07 NOTE — Telephone Encounter (Signed)
Copied from [place] [id number]. Topic: General - Call Back - No Documentation >> Feb 07, 2019  1:57 PM Erick Blinks wrote: Reason for CRM: Pt received Flu shot today at [facility]

## 2019-04-03 ENCOUNTER — Telehealth: Payer: Self-pay | Admitting: Internal Medicine

## 2019-04-03 NOTE — Telephone Encounter (Signed)
Initial appointment for patients cpe on 05/15/2019 made in error. Called to notify patient of reschedule for 2/15/202. LVM on home number and cell vm is not set up.

## 2019-05-15 ENCOUNTER — Encounter: Payer: Medicare Other | Admitting: Internal Medicine

## 2019-06-06 ENCOUNTER — Telehealth: Payer: Self-pay | Admitting: Internal Medicine

## 2019-06-06 DIAGNOSIS — R5383 Other fatigue: Secondary | ICD-10-CM

## 2019-06-06 DIAGNOSIS — E7800 Pure hypercholesterolemia, unspecified: Secondary | ICD-10-CM

## 2019-06-06 DIAGNOSIS — E559 Vitamin D deficiency, unspecified: Secondary | ICD-10-CM

## 2019-06-06 NOTE — Telephone Encounter (Signed)
Patient has a yearly follow up on 06/28/2019, she does have lab order for comprehensive met., does patient need anything else before I schedule lab appt.

## 2019-06-07 NOTE — Telephone Encounter (Signed)
I added a lipid panel. Id there anything else that needs to be ordered?

## 2019-06-26 ENCOUNTER — Other Ambulatory Visit: Payer: Self-pay

## 2019-06-26 ENCOUNTER — Ambulatory Visit: Payer: Medicare Other | Admitting: Internal Medicine

## 2019-06-28 ENCOUNTER — Encounter: Payer: Self-pay | Admitting: Internal Medicine

## 2019-06-28 ENCOUNTER — Other Ambulatory Visit: Payer: Self-pay

## 2019-06-28 ENCOUNTER — Ambulatory Visit (INDEPENDENT_AMBULATORY_CARE_PROVIDER_SITE_OTHER): Payer: Medicare Other | Admitting: Internal Medicine

## 2019-06-28 VITALS — BP 98/66 | HR 91 | Temp 97.4°F | Resp 15 | Ht 59.0 in | Wt 97.4 lb

## 2019-06-28 DIAGNOSIS — E559 Vitamin D deficiency, unspecified: Secondary | ICD-10-CM

## 2019-06-28 DIAGNOSIS — Z780 Asymptomatic menopausal state: Secondary | ICD-10-CM | POA: Diagnosis not present

## 2019-06-28 DIAGNOSIS — R5383 Other fatigue: Secondary | ICD-10-CM | POA: Diagnosis not present

## 2019-06-28 DIAGNOSIS — E7800 Pure hypercholesterolemia, unspecified: Secondary | ICD-10-CM | POA: Diagnosis not present

## 2019-06-28 DIAGNOSIS — Z1231 Encounter for screening mammogram for malignant neoplasm of breast: Secondary | ICD-10-CM

## 2019-06-28 DIAGNOSIS — Z0000 Encounter for general adult medical examination without abnormal findings: Secondary | ICD-10-CM

## 2019-06-28 DIAGNOSIS — Z720 Tobacco use: Secondary | ICD-10-CM | POA: Diagnosis not present

## 2019-06-28 DIAGNOSIS — F419 Anxiety disorder, unspecified: Secondary | ICD-10-CM | POA: Diagnosis not present

## 2019-06-28 DIAGNOSIS — F5105 Insomnia due to other mental disorder: Secondary | ICD-10-CM | POA: Diagnosis not present

## 2019-06-28 DIAGNOSIS — Z1211 Encounter for screening for malignant neoplasm of colon: Secondary | ICD-10-CM | POA: Diagnosis not present

## 2019-06-28 NOTE — Patient Instructions (Addendum)
Your annual mammogram has been ordered.  You are encouraged (required) to call to make your appointment at [facility]  DEXA has been ordered. This will be done at [facility] clinic   Your colonoscopy is not due until 2022 (unless you develop a change in stool patterns,  Or develop iron deficiency anemia, or see blood in your stool)     Health Maintenance After Age 71 After age 89, you are at a higher risk for certain long-term diseases and infections as well as injuries from falls. Falls are a major cause of broken bones and head injuries in people who are older than age 53. Getting regular preventive care can help to keep you healthy and well. Preventive care includes getting regular testing and making lifestyle changes as recommended by your health care provider. Talk with your health care provider about:  Which screenings and tests you should have. A screening is a test that checks for a disease when you have no symptoms.  A diet and exercise plan that is right for you. What should I know about screenings and tests to prevent falls? Screening and testing are the best ways to find a health problem early. Early diagnosis and treatment give you the best chance of managing medical conditions that are common after age 18. Certain conditions and lifestyle choices may make you more likely to have a fall. Your health care provider may recommend:  Regular vision checks. Poor vision and conditions such as cataracts can make you more likely to have a fall. If you wear glasses, make sure to get your prescription updated if your vision changes.  Medicine review. Work with your health care provider to regularly review all of the medicines you are taking, including over-the-counter medicines. Ask your health care provider about any side effects that may make you more likely to have a fall. Tell your health care provider if any medicines that you take make you feel dizzy or sleepy.  Osteoporosis  screening. Osteoporosis is a condition that causes the bones to get weaker. This can make the bones weak and cause them to break more easily.  Blood pressure screening. Blood pressure changes and medicines to control blood pressure can make you feel dizzy.  Strength and balance checks. Your health care provider may recommend certain tests to check your strength and balance while standing, walking, or changing positions.  Foot health exam. Foot pain and numbness, as well as not wearing proper footwear, can make you more likely to have a fall.  Depression screening. You may be more likely to have a fall if you have a fear of falling, feel emotionally low, or feel unable to do activities that you used to do.  Alcohol use screening. Using too much alcohol can affect your balance and may make you more likely to have a fall. What actions can I take to lower my risk of falls? General instructions  Talk with your health care provider about your risks for falling. Tell your health care provider if: ? You fall. Be sure to tell your health care provider about all falls, even ones that seem minor. ? You feel dizzy, sleepy, or off-balance.  Take over-the-counter and prescription medicines only as told by your health care provider. These include any supplements.  Eat a healthy diet and maintain a healthy weight. A healthy diet includes low-fat dairy products, low-fat (lean) meats, and fiber from whole grains, beans, and lots of fruits and vegetables. Home safety  Remove any  tripping hazards, such as rugs, cords, and clutter.  Install safety equipment such as grab bars in bathrooms and safety rails on stairs.  Keep rooms and walkways well-lit. Activity   Follow a regular exercise program to stay fit. This will help you maintain your balance. Ask your health care provider what types of exercise are appropriate for you.  If you need a cane or walker, use it as recommended by your health care  provider.  Wear supportive shoes that have nonskid soles. Lifestyle  Do not drink alcohol if your health care provider tells you not to drink.  If you drink alcohol, limit how much you have: ? 0-1 drink a day for women. ? 0-2 drinks a day for men.  Be aware of how much alcohol is in your drink. In the [place] one drink equals one typical bottle of beer (12 oz), one-half glass of wine (5 oz), or one shot of hard liquor (1 oz).  Do not use any products that contain nicotine or tobacco, such as cigarettes and e-cigarettes. If you need help quitting, ask your health care provider. Summary  Having a healthy lifestyle and getting preventive care can help to protect your health and wellness after age 96.  Screening and testing are the best way to find a health problem early and help you avoid having a fall. Early diagnosis and treatment give you the best chance for managing medical conditions that are more common for people who are older than age 70.  Falls are a major cause of broken bones and head injuries in people who are older than age 20. Take precautions to prevent a fall at home.  Work with your health care provider to learn what changes you can make to improve your health and wellness and to prevent falls. This information is not intended to replace advice given to you by your health care provider. Make sure you discuss any questions you have with your health care provider. Document Revised: 08/18/2018 Document Reviewed: 03/10/2017 Elsevier Patient Education  2020 [employer].

## 2019-06-29 LAB — LIPID PANEL
Cholesterol: 146 mg/dL (ref <200)
HDL: 66 mg/dL (ref >=50)
LDL Cholesterol (Calc): 66 mg/dL (calc)
Non-HDL Cholesterol (Calc): 80 mg/dL (calc) (ref <130)
Total CHOL/HDL Ratio: 2.2 (calc) (ref <5.0)
Triglycerides: 56 mg/dL (ref <150)

## 2019-06-29 LAB — CBC WITH DIFFERENTIAL/PLATELET
Absolute Monocytes: 640 cells/uL (ref 200–950)
Basophils Absolute: 100 cells/uL (ref 0–200)
Basophils Relative: 1 %
Eosinophils Absolute: 40 cells/uL (ref 15–500)
Eosinophils Relative: 0.4 %
HCT: 44.1 % (ref 35.0–45.0)
Hemoglobin: 14.7 g/dL (ref 11.7–15.5)
Lymphs Abs: 3120 cells/uL (ref 850–3900)
MCH: 30.9 pg (ref 27.0–33.0)
MCHC: 33.3 g/dL (ref 32.0–36.0)
MCV: 92.6 fL (ref 80.0–100.0)
MPV: 10.5 fL (ref 7.5–12.5)
Monocytes Relative: 6.4 %
Neutro Abs: 6100 cells/uL (ref 1500–7800)
Neutrophils Relative %: 61 %
Platelets: 291 10*3/uL (ref 140–400)
RBC: 4.76 10*6/uL (ref 3.80–5.10)
RDW: 12.5 % (ref 11.0–15.0)
Total Lymphocyte: 31.2 %
WBC: 10 10*3/uL (ref 3.8–10.8)

## 2019-06-29 LAB — COMPREHENSIVE METABOLIC PANEL
AG Ratio: 2.3 (calc) (ref 1.0–2.5)
ALT: 19 U/L (ref 6–29)
AST: 23 U/L (ref 10–35)
Albumin: 4.5 g/dL (ref 3.6–5.1)
Alkaline phosphatase (APISO): 54 U/L (ref 37–153)
BUN: 12 mg/dL (ref 7–25)
CO2: 29 mmol/L (ref 20–32)
Calcium: 9.8 mg/dL (ref 8.6–10.4)
Chloride: 104 mmol/L (ref 98–110)
Creat: 0.9 mg/dL (ref 0.60–0.93)
Globulin: 2 g/dL (calc) (ref 1.9–3.7)
Glucose, Bld: 94 mg/dL (ref 65–99)
Potassium: 4.3 mmol/L (ref 3.5–5.3)
Sodium: 141 mmol/L (ref 135–146)
Total Bilirubin: 0.5 mg/dL (ref 0.2–1.2)
Total Protein: 6.5 g/dL (ref 6.1–8.1)

## 2019-06-29 LAB — TSH: TSH: 1.15 mIU/L (ref 0.40–4.50)

## 2019-06-29 LAB — VITAMIN D 25 HYDROXY (VIT D DEFICIENCY, FRACTURES): Vit D, 25-Hydroxy: 30 ng/mL (ref 30–100)

## 2019-06-29 NOTE — Assessment & Plan Note (Signed)
Resolved with Junior's passing. She has not filled the lorazepam since July

## 2019-06-29 NOTE — Progress Notes (Signed)
Patient ID: Kimberly Schmitt, female    DOB: 10/14/1950  Age: 69 y.o. MRN: [id number]  The patient is here for annual follow up  and management of other chronic and acute problems.   The risk factors are reflected in the social history.  The roster of all physicians providing medical care to patient - is listed in the Snapshot section of the chart.  Activities of daily living:  The patient is 100% independent in all ADLs: dressing, toileting, feeding as well as independent mobility  Home safety : The patient has smoke detectors in the home. They wear seatbelts.  There are no firearms at home. There is no violence in the home.   There is no risks for hepatitis, STDs or HIV. There is no   history of blood transfusion. They have no travel history to infectious disease endemic areas of the world.  The patient has seen their dentist in the last six month. They have seen their eye doctor in the last year. They admit to slight hearing difficulty with regard to whispered voices and some television programs.  They have deferred audiologic testing in the last year.  They do not  have excessive sun exposure. Discussed the need for sun protection: hats, long sleeves and use of sunscreen if there is significant sun exposure.   Diet: the importance of a healthy diet is discussed. They do have a healthy diet.  The benefits of regular aerobic exercise were discussed. She walks 4 times per week ,  20 minutes.   Depression screen: there are no signs or vegative symptoms of depression- irritability, change in appetite, anhedonia, sadness/tearfullness.  Cognitive assessment: the patient manages all their financial and personal affairs and is actively engaged. They could relate day,date,year and events; recalled 2/3 objects at 3 minutes; performed clock-face test normally.  The following portions of the patient's history were reviewed and updated as appropriate: allergies, current medications, past family history,  past medical history,  past surgical history, past social history  and problem list.  Visual acuity was not assessed per patient preference since she has regular follow up with her ophthalmologist. Hearing and body mass index were assessed and reviewed.   During the course of the visit the patient was educated and counseled about appropriate screening and preventive services including : fall prevention , diabetes screening, nutrition counseling, colorectal cancer screening, and recommended immunizations.    CC: The primary encounter diagnosis was Encounter for screening mammogram for breast cancer. Diagnoses of Postmenopausal estrogen deficiency, Vitamin D deficiency, Pure hypercholesterolemia, Fatigue, unspecified type, Tobacco abuse, Insomnia secondary to anxiety, and Screening for colon cancer were also pertinent to this visit.  Follow up on grief after losing "Junior" to thyroid cancer .  She returned to work full time 2 weeks after her husband died,  Which she felt was needed to prevent her from become depressed.  She has frequent contact with her sons and grandchildren and appears to be at peace with her loss. She has resumed smoking > 1/2 pack daily but has no symptoms of cough or dyspnea and remains quite physically active doing yardwork.     History Greenley has a past medical history of Hyperlipidemia, Personal history of tobacco use, presenting hazards to health (06/12/2015), and Visit for preventive health examination (04/17/2013).   She has a past surgical history that includes Appendectomy (1968).   Her family history includes Breast cancer (age of onset: 40) in her maternal grandmother; Cancer in her maternal grandmother; Cancer (age  of onset: 53) in her brother; Cancer (age of onset: 61) in her father; Diabetes in her sister; Stroke in her maternal aunt.She reports that she has been smoking cigarettes. She has a 22.50 pack-year smoking history. She has never used smokeless tobacco. She  reports that she does not drink alcohol or use drugs.  Outpatient Medications Prior to Visit  Medication Sig Dispense Refill  . aspirin 81 MG tablet Take 81 mg by mouth daily.    Marland Kitchen atorvastatin (LIPITOR) 20 MG tablet TAKE 1 TABLET (20 MG TOTAL) BY MOUTH DAILY. 90 tablet 3  . cholecalciferol (VITAMIN D) 1000 units tablet Take 1,000 Units by mouth daily.    Marland Kitchen LORazepam (ATIVAN) 1 MG tablet TAKE 1 TABLET (1 MG TOTAL) BY MOUTH 2 (TWO) TIMES DAILY. AS NEEDED FOR INSOMNIA AND ANXIETY 30 tablet 5  . PARoxetine (PAXIL) 10 MG tablet TAKE 1 TABLET (10 MG TOTAL) BY MOUTH DAILY. AFTER DINNER 90 tablet 2  . Probiotic Product (PROBIOTIC DAILY PO) Take by mouth.     No facility-administered medications prior to visit.    Review of Systems   Patient denies headache, fevers, malaise, unintentional weight loss, skin rash, eye pain, sinus congestion and sinus pain, sore throat, dysphagia,  hemoptysis , cough, dyspnea, wheezing, chest pain, palpitations, orthopnea, edema, abdominal pain, nausea, melena, diarrhea, constipation, flank pain, dysuria, hematuria, urinary  Frequency, nocturia, numbness, tingling, seizures,  Focal weakness, Loss of consciousness,  Tremor, insomnia, depression, anxiety, and suicidal ideation.      Objective:  BP 98/66 (BP Location: Left Arm, Patient Position: Sitting, Cuff Size: Normal)   Pulse 91   Temp (!) 97.4 F (36.3 C) (Temporal)   Resp 15   Ht 4\' 11"  (1.499 m)   Wt 97 lb 6.4 oz (44.2 kg)   SpO2 99%   BMI 19.67 kg/m   Physical Exam  General appearance: alert, cooperative and appears stated age Head: Normocephalic, without obvious abnormality, atraumatic Eyes: conjunctivae/corneas clear. PERRL, EOM's intact. Fundi benign. Ears: normal TM's and external ear canals both ears Nose: Nares normal. Septum midline. Mucosa normal. No drainage or sinus tenderness. Throat: lips, mucosa, and tongue normal; teeth and gums normal Neck: no adenopathy, no carotid bruit, no JVD,  supple, symmetrical, trachea midline and thyroid not enlarged, symmetric, no tenderness/mass/nodules Lungs: clear to auscultation bilaterally Breasts: normal appearance, no masses or tenderness Heart: regular rate and rhythm, S1, S2 normal, no murmur, click, rub or gallop Abdomen: soft, non-tender; bowel sounds normal; no masses,  no organomegaly Extremities: extremities normal, atraumatic, no cyanosis or edema Pulses: 2+ and symmetric Skin: Skin color, texture, turgor normal. No rashes or lesions Neurologic: Alert and oriented X 3, normal strength and tone. Normal symmetric reflexes. Normal coordination and gait.     Assessment & Plan:   Problem List Items Addressed This Visit      Unprioritized   Tobacco abuse    Increased use since Junior passed away. Does not want to quit ("I;'ve been smoking for 50 yrs)  She really has no desire to quit despite an acknowledgement of the current and future hazards to patient's  health,  Patient was encouraged to continue her annual lung CT which was done in September 2020       Screening for colon cancer   Hyperlipidemia    She is tolerating  atorvastatin 20 mg daily , started for management off 11% risk of CAD by [facility] in 2018 and presence of atherosclerosis on CT  She has no side effects  and liver enzymes are normal. No changes today  Lab Results  Component Value Date   CHOL 146 06/28/2019   HDL 66 06/28/2019   LDLCALC 66 06/28/2019   LDLDIRECT 147.5 04/17/2013   TRIG 56 06/28/2019   CHOLHDL 2.2 06/28/2019   Lab Results  Component Value Date   ALT 19 06/28/2019   AST 23 06/28/2019   ALKPHOS 60 06/22/2018   BILITOT 0.5 06/28/2019         Relevant Orders   TSH (Completed)   Lipid panel (Completed)   Vitamin D deficiency   Relevant Orders   VITAMIN D 25 Hydroxy (Vit-D Deficiency, Fractures) (Completed)   Insomnia secondary to anxiety    Resolved with Junior's passing. She has not filled the lorazepam since July         Other  Visit Diagnoses    Encounter for screening mammogram for breast cancer    -  Primary   Relevant Orders   MM 3D SCREEN BREAST BILATERAL   Postmenopausal estrogen deficiency       Relevant Orders   DG Bone Density   Fatigue, unspecified type       Relevant Orders   Comprehensive metabolic panel (Completed)   CBC with Differential/Platelet (Completed)      I am having Nithya A. Kudo maintain her aspirin, Probiotic Product (PROBIOTIC DAILY PO), cholecalciferol, PARoxetine, LORazepam, and atorvastatin.  No orders of the defined types were placed in this encounter.   There are no discontinued medications.  Follow-up: No follow-ups on file.   Crecencio Mc, MD

## 2019-06-29 NOTE — Assessment & Plan Note (Addendum)
Increased use since Junior passed away. Does not want to quit ("I;'ve been smoking for 50 yrs)  She really has no desire to quit despite an acknowledgement of the current and future hazards to patient's  health,  Patient was encouraged to continue her annual lung CT which was done in September 2020

## 2019-06-29 NOTE — Assessment & Plan Note (Signed)
She is tolerating  atorvastatin 20 mg daily , started for management off 11% risk of CAD by [facility] in 2018 and presence of atherosclerosis on CT  She has no side effects and liver enzymes are normal. No changes today  Lab Results  Component Value Date   CHOL 146 06/28/2019   HDL 66 06/28/2019   LDLCALC 66 06/28/2019   LDLDIRECT 147.5 04/17/2013   TRIG 56 06/28/2019   CHOLHDL 2.2 06/28/2019   Lab Results  Component Value Date   ALT 19 06/28/2019   AST 23 06/28/2019   ALKPHOS 60 06/22/2018   BILITOT 0.5 06/28/2019

## 2019-06-29 NOTE — Assessment & Plan Note (Signed)

## 2019-07-26 ENCOUNTER — Ambulatory Visit
Admission: RE | Admit: 2019-07-26 | Discharge: 2019-07-26 | Disposition: A | Discharge status: Home / Self Care | Payer: Medicare Other | Source: Ambulatory Visit | Attending: Internal Medicine | Admitting: Internal Medicine

## 2019-07-26 DIAGNOSIS — M810 Age-related osteoporosis without current pathological fracture: Secondary | ICD-10-CM | POA: Diagnosis not present

## 2019-07-26 DIAGNOSIS — Z780 Asymptomatic menopausal state: Secondary | ICD-10-CM | POA: Insufficient documentation

## 2019-07-26 DIAGNOSIS — Z1231 Encounter for screening mammogram for malignant neoplasm of breast: Secondary | ICD-10-CM | POA: Diagnosis not present

## 2019-07-27 ENCOUNTER — Other Ambulatory Visit: Payer: Self-pay | Admitting: Internal Medicine

## 2019-07-27 NOTE — Telephone Encounter (Signed)
Refilled: 11/10/2018 Last OV: 06/28/2019 Next OV: not scheduled

## 2019-07-29 ENCOUNTER — Encounter: Payer: Self-pay | Admitting: Internal Medicine

## 2019-07-29 DIAGNOSIS — M810 Age-related osteoporosis without current pathological fracture: Secondary | ICD-10-CM | POA: Insufficient documentation

## 2019-07-29 NOTE — Progress Notes (Signed)
Your Bone Density scores have been received, and it appears that you have progressed from osteopenia to osteoporosis,  which places you at higher risk for fracture over the next 10 years.    I recommend treating with medication but would need to discuss the pros and cons of starting medications this early, along with  the various choices that are currently available .  For now I would continue to work at getting a minimum  calcium intake of 1200 to 1800 mg daily through diet and supplements ,  And 2000 units of vitamin d daily as well.  weight bearing exercise on a regular basis, has been shown to be helpful as well.  If you would lie to discuss pharmacotherapy, please  Make an  appt to discuss .  Regards,  Dr. Derrel Nip

## 2019-08-25 ENCOUNTER — Ambulatory Visit (INDEPENDENT_AMBULATORY_CARE_PROVIDER_SITE_OTHER): Payer: Medicare Other | Admitting: Internal Medicine

## 2019-08-25 ENCOUNTER — Other Ambulatory Visit: Payer: Self-pay

## 2019-08-25 ENCOUNTER — Encounter: Payer: Self-pay | Admitting: Internal Medicine

## 2019-08-25 DIAGNOSIS — M810 Age-related osteoporosis without current pathological fracture: Secondary | ICD-10-CM

## 2019-08-25 NOTE — Progress Notes (Signed)
Subjective:  Patient ID: Kimberly Schmitt, female    DOB: March 02, 1951  Age: 69 y.o. MRN: [id number]  CC: The encounter diagnosis was Age-related osteoporosis without current pathological fracture.  HPI Kimberly Schmitt presents for follow up on osteoporosis diagnosed by recent DEXA scan.  She has no history of fragility fractures .  Her risk fractures including ongoing tobacco abuse,  FH,  And weight < 125 lbs .  She has lost 8 lbs over the last year unintentionally.  She denies loss of appetite,  Early satiety and depression.  She has been physically very active .    Outpatient Medications Prior to Visit  Medication Sig Dispense Refill  . aspirin 81 MG tablet Take 81 mg by mouth daily.    Marland Kitchen atorvastatin (LIPITOR) 20 MG tablet TAKE 1 TABLET (20 MG TOTAL) BY MOUTH DAILY. 90 tablet 3  . cholecalciferol (VITAMIN D) 1000 units tablet Take 1,000 Units by mouth daily.    Marland Kitchen LORazepam (ATIVAN) 1 MG tablet TAKE 1 TABLET (1 MG TOTAL) BY MOUTH 2 (TWO) TIMES DAILY. AS NEEDED FOR INSOMNIA AND ANXIETY 30 tablet 5  . PARoxetine (PAXIL) 10 MG tablet TAKE 1 TABLET (10 MG TOTAL) BY MOUTH DAILY. AFTER DINNER 90 tablet 2  . Probiotic Product (PROBIOTIC DAILY PO) Take by mouth.     No facility-administered medications prior to visit.    Review of Systems;  Patient denies headache, fevers, malaise, unintentional weight loss, skin rash, eye pain, sinus congestion and sinus pain, sore throat, dysphagia,  hemoptysis , cough, dyspnea, wheezing, chest pain, palpitations, orthopnea, edema, abdominal pain, nausea, melena, diarrhea, constipation, flank pain, dysuria, hematuria, urinary  Frequency, nocturia, numbness, tingling, seizures,  Focal weakness, Loss of consciousness,  Tremor, insomnia, depression, anxiety, and suicidal ideation.      Objective:  BP 116/60 (BP Location: Left Arm, Patient Position: Sitting, Cuff Size: Normal)   Pulse (!) 108   Temp 97.8 F (36.6 C) (Temporal)   Resp 15   Ht 4\' 11"  (1.499  m)   Wt 95 lb 9.6 oz (43.4 kg)   SpO2 98%   BMI 19.31 kg/m   BP Readings from Last 3 Encounters:  08/25/19 116/60  06/28/19 98/66  12/28/18 118/79    Wt Readings from Last 3 Encounters:  08/25/19 95 lb 9.6 oz (43.4 kg)  06/28/19 97 lb 6.4 oz (44.2 kg)  01/24/19 98 lb (44.5 kg)    General appearance: alert, cooperative and appears stated age Ears: normal TM's and external ear canals both ears Throat: lips, mucosa, and tongue normal; teeth and gums normal Neck: no adenopathy, no carotid bruit, supple, symmetrical, trachea midline and thyroid not enlarged, symmetric, no tenderness/mass/nodules Back: symmetric, no curvature. ROM normal. No CVA tenderness. Lungs: clear to auscultation bilaterally Heart: regular rate and rhythm, S1, S2 normal, no murmur, click, rub or gallop Abdomen: soft, non-tender; bowel sounds normal; no masses,  no organomegaly Pulses: 2+ and symmetric Skin: Skin color, texture, turgor normal. No rashes or lesions Lymph nodes: Cervical, supraclavicular, and axillary nodes normal.  No results found for: HGBA1C  Lab Results  Component Value Date   CREATININE 0.90 06/28/2019   CREATININE 0.71 06/22/2018   CREATININE 0.78 12/16/2017    Lab Results  Component Value Date   WBC 10.0 06/28/2019   HGB 14.7 06/28/2019   HCT 44.1 06/28/2019   PLT 291 06/28/2019   GLUCOSE 94 06/28/2019   CHOL 146 06/28/2019   TRIG 56 06/28/2019   HDL 66 06/28/2019  LDLDIRECT 147.5 04/17/2013   LDLCALC 66 06/28/2019   ALT 19 06/28/2019   AST 23 06/28/2019   NA 141 06/28/2019   K 4.3 06/28/2019   CL 104 06/28/2019   CREATININE 0.90 06/28/2019   BUN 12 06/28/2019   CO2 29 06/28/2019   TSH 1.15 06/28/2019    DG Bone Density  Result Date: 07/26/2019 EXAM: DUAL X-RAY ABSORPTIOMETRY (DXA) FOR BONE MINERAL DENSITY IMPRESSION: Your patient Kimberly Schmitt completed a BMD test on 07/26/2019 using the [employer] (software version: 14.10) manufactured by [employer] The following summarizes the results of our evaluation. Technologist: MTB PATIENT BIOGRAPHICAL: Name: Kimberly Schmitt, Kimberly Schmitt Patient ID: [id number] Birth Date: 1950-08-03 Height: 60.0 in. Gender: Female Exam Date: 07/26/2019 Weight: 98.0 lbs. Indications: Caucasian, Osteopenia, Postmenopausal, Tobacco User, Vitamin D Deficiency Fractures: Left foot Treatments: Vitamin D DENSITOMETRY RESULTS: Site          Region     Measured Date Measured Age WHO Classification Young Adult T-score BMD         %Change vs. Previous Significant Change (*) AP Spine L1-L2 07/26/2019 70.5 Osteopenia -1.3 1.023 g/cm2 - - DualFemur Neck Left 07/26/2019 70.5 Osteoporosis -2.6 0.677 g/cm2 - - DualFemur Total Mean 07/26/2019 70.5 Osteopenia -1.2 0.856 g/cm2 - - Right Forearm Radius 33% 07/26/2019 70.5 Normal -0.9 0.799 g/cm2 - - ASSESSMENT: The BMD measured at Femur Neck Left is 0.677 g/cm2 with a T-score of -2.6. This patient is considered osteoporotic according to [place]) criteria. The scan quality is good. L-3 and L-4 were excluded due to degenerative changes. World Pharmacologist [facility]) criteria for post-menopausal, Caucasian Women: Normal:                   T-score at or above -1 SD Osteopenia/low bone mass: T-score between -1 and -2.5 SD Osteoporosis:             T-score at or below -2.5 SD RECOMMENDATIONS: 1. All patients should optimize calcium and vitamin D intake. 2. Consider FDA-approved medical therapies in postmenopausal women and men aged 78 years and older, based on the following: a. A hip or vertebral(clinical or morphometric) fracture b. T-score < -2.5 at the femoral neck or spine after appropriate evaluation to exclude secondary causes c. Low bone mass (T-score between -1.0 and -2.5 at the femoral neck or spine) and a 10-year probability of a hip fracture > 3% or a 10-year probability of a major osteoporosis-related fracture > 20% based on the [place]-adapted WHO algorithm 3. Clinician judgment  and/or patient preferences may indicate treatment for people with 10-year fracture probabilities above or below these levels FOLLOW-UP: People with diagnosed cases of osteoporosis or at high risk for fracture should have regular bone mineral density tests. For patients eligible for Medicare, routine testing is allowed once every 2 years. The testing frequency can be increased to one year for patients who have rapidly progressing disease, those who are receiving or discontinuing medical therapy to restore bone mass, or have additional risk factors. I have reviewed this report, and agree with the above findings. [facility] Electronically Signed   By: Lowella Grip III M.D.   On: 07/26/2019 14:58   MM 3D SCREEN BREAST BILATERAL  Result Date: 07/27/2019 CLINICAL DATA:  Screening. EXAM: DIGITAL SCREENING BILATERAL MAMMOGRAM WITH TOMO AND CAD COMPARISON:  Previous exam(s). ACR Breast Density Category d: The breast tissue is extremely dense, which lowers the sensitivity of mammography FINDINGS: There are no findings  suspicious for malignancy. Images were processed with CAD. IMPRESSION: No mammographic evidence of malignancy. A result letter of this screening mammogram will be mailed directly to the patient. RECOMMENDATION: Screening mammogram in one year. (Code:[id number]-B-01Y) BI-RADS CATEGORY  1: Negative. Electronically Signed   By: Abelardo Diesel M.D.   On: 07/27/2019 11:19    Assessment & Plan:   Problem List Items Addressed This Visit      Unprioritized   Osteoporosis    Available options discussed.  She prefers to use Prolia.  If not covered, alendronate.  Discussed the current controversies surrounding the risks and benefits of calcium supplementation.  Encouraged her to increase dietary calcium through natural foods including almond/coconut milk         I provided  30 minutes of  face-to-face time during this encounter reviewing patient's current problems and past surgeries, labs and  imaging studies, providing counseling on the above mentioned problems , and coordination  of care .  I am having Kimberly Schmitt maintain her aspirin, Probiotic Product (PROBIOTIC DAILY PO), cholecalciferol, atorvastatin, PARoxetine, and LORazepam.  No orders of the defined types were placed in this encounter.   There are no discontinued medications.  Follow-up: No follow-ups on file.   Crecencio Mc, MD

## 2019-08-25 NOTE — Patient Instructions (Signed)
We will try to get Prolia approved for treatment of osteoporosis.  This is the injection that you receive from our office every 6 months  If your insurance will not pay for it,  We will start alendronate ("Fosomax)  Increase calcium intake to 1800 mg daily (goal) through diet and supplements  Increase Vitamin D to 2000 Ius daily    Osteoporosis  Osteoporosis is thinning and loss of density in your bones. Osteoporosis makes bones more brittle and fragile and more likely to break (fracture). Over time, osteoporosis can cause your bones to become so weak that they fracture after a minor fall. Bones in the hip, wrist, and spine are most likely to fracture due to osteoporosis. What are the causes? The exact cause of this condition is not known. What increases the risk? You may be at greater risk for osteoporosis if you:  Have a family history of the condition.  Have poor nutrition.  Use steroid medicines, such as prednisone.  Are female.  Are age 71 or older.  Smoke or have a history of smoking.  Are not physically active (are sedentary).  Are white (Caucasian) or of Asian descent.  Have a small body frame.  Take certain medicines, such as antiseizure medicines. What are the signs or symptoms? A fracture might be the first sign of osteoporosis, especially if the fracture results from a fall or injury that usually would not cause a bone to break. Other signs and symptoms include:  Pain in the neck or low back.  Stooped posture.  Loss of height. How is this diagnosed? This condition may be diagnosed based on:  Your medical history.  A physical exam.  A bone mineral density test, also called a DXA or DEXA test (dual-energy X-ray absorptiometry test). This test uses X-rays to measure the amount of minerals in your bones. How is this treated? The goal of treatment is to strengthen your bones and lower your risk for a fracture. Treatment may involve:  Making lifestyle  changes, such as: ? Including foods with more calcium and vitamin D in your diet. ? Doing weight-bearing and muscle-strengthening exercises. ? Stopping tobacco use. ? Limiting alcohol intake.  Taking medicine to slow the process of bone loss or to increase bone density.  Taking daily supplements of calcium and vitamin D.  Taking hormone replacement medicines, such as estrogen for women and testosterone for men.  Monitoring your levels of calcium and vitamin D. Follow these instructions at home:  Activity  Exercise as told by your health care provider. Ask your health care provider what exercises and activities are safe for you. You should do: ? Exercises that make you work against gravity (weight-bearing exercises), such as tai chi, yoga, or walking. ? Exercises to strengthen muscles, such as lifting weights. Lifestyle  Limit alcohol intake to no more than 1 drink a day for nonpregnant women and 2 drinks a day for men. One drink equals 12 oz of beer, 5 oz of wine, or 1 oz of hard liquor.  Do not use any products that contain nicotine or tobacco, such as cigarettes and e-cigarettes. If you need help quitting, ask your health care provider. Preventing falls  Use devices to help you move around (mobility aids) as needed, such as canes, walkers, scooters, or crutches.  Keep rooms well-lit and clutter-free.  Remove tripping hazards from walkways, including cords and throw rugs.  Install grab bars in bathrooms and safety rails on stairs.  Use rubber mats in the bathroom  and other areas that are often wet or slippery.  Wear closed-toe shoes that fit well and support your feet. Wear shoes that have rubber soles or low heels.  Review your medicines with your health care provider. Some medicines can cause dizziness or changes in blood pressure, which can increase your risk of falling. General instructions  Include calcium and vitamin D in your diet. Calcium is important for bone  health, and vitamin D helps your body to absorb calcium. Good sources of calcium and vitamin D include: ? Certain fatty fish, such as salmon and tuna. ? Products that have calcium and vitamin D added to them (fortified products), such as fortified cereals. ? Egg yolks. ? Cheese. ? Liver.  Take over-the-counter and prescription medicines only as told by your health care provider.  Keep all follow-up visits as told by your health care provider. This is important. Contact a health care provider if:  You have never been screened for osteoporosis and you are: ? A woman who is age 71 or older. ? A man who is age 71 or older. Get help right away if:  You fall or injure yourself. Summary  Osteoporosis is thinning and loss of density in your bones. This makes bones more brittle and fragile and more likely to break (fracture),even with minor falls.  The goal of treatment is to strengthen your bones and reduce your risk for a fracture.  Include calcium and vitamin D in your diet. Calcium is important for bone health, and vitamin D helps your body to absorb calcium.  Talk with your health care provider about screening for osteoporosis if you are a woman who is age 71 or older, or a man who is age 71 or older. This information is not intended to replace advice given to you by your health care provider. Make sure you discuss any questions you have with your health care provider. Document Revised: 04/09/2017 Document Reviewed: 02/19/2017 Elsevier Patient Education  [place].  Denosumab injection What is this medicine? DENOSUMAB (den oh sue mab) slows bone breakdown. Prolia is used to treat osteoporosis in women after menopause and in men, and in people who are taking corticosteroids for 6 months or more. Delton See is used to treat a high calcium level due to cancer and to prevent bone fractures and other bone problems caused by multiple myeloma or cancer bone metastases. Delton See is also used to  treat giant cell tumor of the bone. This medicine may be used for other purposes; ask your health care provider or pharmacist if you have questions. COMMON BRAND NAME(S): Prolia, XGEVA What should I tell my health care provider before I take this medicine? They need to know if you have any of these conditions:  dental disease  having surgery or tooth extraction  infection  kidney disease  low levels of calcium or Vitamin D in the blood  malnutrition  on hemodialysis  skin conditions or sensitivity  thyroid or parathyroid disease  an unusual reaction to denosumab, other medicines, foods, dyes, or preservatives  pregnant or trying to get pregnant  breast-feeding How should I use this medicine? This medicine is for injection under the skin. It is given by a health care professional in a hospital or clinic setting. A special MedGuide will be given to you before each treatment. Be sure to read this information carefully each time. For Prolia, talk to your pediatrician regarding the use of this medicine in children. Special care may be needed. For [place],  talk to your pediatrician regarding the use of this medicine in children. While this drug may be prescribed for children as young as 13 years for selected conditions, precautions do apply. Overdosage: If you think you have taken too much of this medicine contact a poison control center or emergency room at once. NOTE: This medicine is only for you. Do not share this medicine with others. What if I miss a dose? It is important not to miss your dose. Call your doctor or health care professional if you are unable to keep an appointment. What may interact with this medicine? Do not take this medicine with any of the following medications:  other medicines containing denosumab This medicine may also interact with the following medications:  medicines that lower your chance of fighting infection  steroid medicines like prednisone or  cortisone This list may not describe all possible interactions. Give your health care provider a list of all the medicines, herbs, non-prescription drugs, or dietary supplements you use. Also tell them if you smoke, drink alcohol, or use illegal drugs. Some items may interact with your medicine. What should I watch for while using this medicine? Visit your doctor or health care professional for regular checks on your progress. Your doctor or health care professional may order blood tests and other tests to see how you are doing. Call your doctor or health care professional for advice if you get a fever, chills or sore throat, or other symptoms of a cold or flu. Do not treat yourself. This drug may decrease your body's ability to fight infection. Try to avoid being around people who are sick. You should make sure you get enough calcium and vitamin D while you are taking this medicine, unless your doctor tells you not to. Discuss the foods you eat and the vitamins you take with your health care professional. See your dentist regularly. Brush and floss your teeth as directed. Before you have any dental work done, tell your dentist you are receiving this medicine. Do not become pregnant while taking this medicine or for 5 months after stopping it. Talk with your doctor or health care professional about your birth control options while taking this medicine. Women should inform their doctor if they wish to become pregnant or think they might be pregnant. There is a potential for serious side effects to an unborn child. Talk to your health care professional or pharmacist for more information. What side effects may I notice from receiving this medicine? Side effects that you should report to your doctor or health care professional as soon as possible:  allergic reactions like skin rash, itching or hives, swelling of the face, lips, or tongue  bone pain  breathing problems  dizziness  jaw pain, especially  after dental work  redness, blistering, peeling of the skin  signs and symptoms of infection like fever or chills; cough; sore throat; pain or trouble passing urine  signs of low calcium like fast heartbeat, muscle cramps or muscle pain; pain, tingling, numbness in the hands or feet; seizures  unusual bleeding or bruising  unusually weak or tired Side effects that usually do not require medical attention (report to your doctor or health care professional if they continue or are bothersome):  constipation  diarrhea  headache  joint pain  loss of appetite  muscle pain  runny nose  tiredness  upset stomach This list may not describe all possible side effects. Call your doctor for medical advice about side effects. You may  report side effects to FDA at 1-800-FDA-1088. Where should I keep my medicine? This medicine is only given in a clinic, doctor's office, or other health care setting and will not be stored at home. NOTE: This sheet is a summary. It may not cover all possible information. If you have questions about this medicine, talk to your doctor, pharmacist, or health care provider.  2020 Elsevier/Gold Standard (2017-09-03 16:10:44)

## 2019-08-26 ENCOUNTER — Encounter: Payer: Self-pay | Admitting: Internal Medicine

## 2019-08-26 NOTE — Assessment & Plan Note (Signed)
Available options discussed.  She prefers to use Prolia.  If not covered, alendronate.  Discussed the current controversies surrounding the risks and benefits of calcium supplementation.  Encouraged her to increase dietary calcium through natural foods including almond/coconut milk

## 2019-09-18 ENCOUNTER — Telehealth: Payer: Self-pay | Admitting: Internal Medicine

## 2019-09-18 DIAGNOSIS — M810 Age-related osteoporosis without current pathological fracture: Secondary | ICD-10-CM

## 2019-09-18 NOTE — Telephone Encounter (Signed)
Pt called to check on the status of her Prolia being approved  She talked about this at her last appt with Dr. Derrel Nip in April

## 2019-09-18 NOTE — Telephone Encounter (Signed)
I did not receive a message ok to start the process for prolia?

## 2019-09-19 NOTE — Telephone Encounter (Signed)
Filed for coverage on [employer] Portal ID [id number]

## 2019-09-19 NOTE — Telephone Encounter (Signed)
Yes, please   Go ahead with prolia authorization thank you

## 2019-09-22 NOTE — Telephone Encounter (Signed)
Called and left message to call office received approval for Prolia ok to schedule  first injection.

## 2019-09-26 DIAGNOSIS — L723 Sebaceous cyst: Secondary | ICD-10-CM | POA: Diagnosis not present

## 2019-09-26 NOTE — Telephone Encounter (Signed)
Thanks.  Chart updated

## 2019-09-26 NOTE — Telephone Encounter (Signed)
Patient has been scheduled 10/04/19 for first Prolia injection.

## 2019-10-04 ENCOUNTER — Ambulatory Visit (INDEPENDENT_AMBULATORY_CARE_PROVIDER_SITE_OTHER): Payer: Medicare Other

## 2019-10-04 ENCOUNTER — Other Ambulatory Visit: Payer: Self-pay

## 2019-10-04 DIAGNOSIS — M810 Age-related osteoporosis without current pathological fracture: Secondary | ICD-10-CM

## 2019-10-04 MED ORDER — DENOSUMAB 60 MG/ML ~~LOC~~ SOSY
60.0000 mg | PREFILLED_SYRINGE | Freq: Once | SUBCUTANEOUS | Status: AC
  Administered 2019-10-04: 60 mg via SUBCUTANEOUS

## 2019-10-04 NOTE — Progress Notes (Signed)
Patient presented for 6-month Prolia injection SQ to right arm. Patient tolerated well. 

## 2019-10-05 DIAGNOSIS — L723 Sebaceous cyst: Secondary | ICD-10-CM | POA: Diagnosis not present

## 2019-10-05 DIAGNOSIS — L720 Epidermal cyst: Secondary | ICD-10-CM | POA: Diagnosis not present

## 2019-11-30 ENCOUNTER — Ambulatory Visit (INDEPENDENT_AMBULATORY_CARE_PROVIDER_SITE_OTHER): Payer: Medicare Other

## 2019-11-30 VITALS — Ht 59.0 in | Wt 95.0 lb

## 2019-11-30 DIAGNOSIS — Z0000 Encounter for general adult medical examination without abnormal findings: Secondary | ICD-10-CM

## 2019-11-30 NOTE — Patient Instructions (Addendum)
Ms. Kimberly Schmitt , Thank you for taking time to come for your Medicare Wellness Visit. I appreciate your ongoing commitment to your health goals. Please review the following plan we discussed and let me know if I can assist you in the future.   These are the goals we discussed: Goals      Patient Stated   .  Follow up with Primary Care Provider (pt-stated)      Patient to reschedule annual CT scan and complete Follow with pcp as needed      Other   .  Quit Smoking       This is a list of the screening recommended for you and due dates:  Health Maintenance  Topic Date Due  . Flu Shot  12/10/2019  . Mammogram  07/25/2020  . Tetanus Vaccine  04/06/2021  . Colon Cancer Screening  04/24/2021  . DEXA scan (bone density measurement)  Completed  . COVID-19 Vaccine  Completed  .  Hepatitis C: One time screening is recommended by Center for Disease Control  (CDC) for  adults born from [birth year] through 1967.   Completed  . Pneumonia vaccines  Completed    Immunizations Immunization History  Administered Date(s) Administered  . Influenza Split 01/16/2013, 01/27/2014, 01/24/2015  . Influenza Whole 03/04/2011, 01/27/2012  . Influenza, High Dose Seasonal PF 02/03/2017, 01/11/2018  . Influenza-Unspecified 01/24/2016, 02/05/2017, 02/07/2019  . PFIZER SARS-COV-2 Vaccination 07/05/2019, 07/26/2019  . Pneumococcal Conjugate-13 05/02/2014  . Pneumococcal Polysaccharide-23 04/11/2012, 06/16/2017  . Tdap 04/07/2011  . Zoster Recombinat (Shingrix) 09/26/2017, 02/03/2018    Keep all routine maintenance appointments.   Follow up 03/06/20 @ 8:30  Advanced directives: declined  Conditions/risks identified: none new  Follow up in one year for your annual wellness visit.   Preventive Care 25 Years and Older, Female Preventive care refers to lifestyle choices and visits with your health care provider that can promote health and wellness. What does preventive care include?  A yearly physical exam.  This is also called an annual well check.  Dental exams once or twice a year.  Routine eye exams. Ask your health care provider how often you should have your eyes checked.  Personal lifestyle choices, including:  Daily care of your teeth and gums.  Regular physical activity.  Eating a healthy diet.  Avoiding tobacco and drug use.  Limiting alcohol use.  Practicing safe sex.  Taking low-dose aspirin every day.  Taking vitamin and mineral supplements as recommended by your health care provider. What happens during an annual well check? The services and screenings done by your health care provider during your annual well check will depend on your age, overall health, lifestyle risk factors, and family history of disease. Counseling  Your health care provider may ask you questions about your:  Alcohol use.  Tobacco use.  Drug use.  Emotional well-being.  Home and relationship well-being.  Sexual activity.  Eating habits.  History of falls.  Memory and ability to understand (cognition).  Work and work Statistician.  Reproductive health. Screening  You may have the following tests or measurements:  Height, weight, and BMI.  Blood pressure.  Lipid and cholesterol levels. These may be checked every 5 years, or more frequently if you are over 65 years old.  Skin check.  Lung cancer screening. You may have this screening every year starting at age 61 if you have a 30-pack-year history of smoking and currently smoke or have quit within the past 15 years.  Fecal occult blood  test (FOBT) of the stool. You may have this test every year starting at age 58.  Flexible sigmoidoscopy or colonoscopy. You may have a sigmoidoscopy every 5 years or a colonoscopy every 10 years starting at age 69.  Hepatitis C blood test.  Hepatitis B blood test.  Sexually transmitted disease (STD) testing.  Diabetes screening. This is done by checking your blood sugar (glucose) after  you have not eaten for a while (fasting). You may have this done every 1-3 years.  Bone density scan. This is done to screen for osteoporosis. You may have this done starting at age 71.  Mammogram. This may be done every 1-2 years. Talk to your health care provider about how often you should have regular mammograms. Talk with your health care provider about your test results, treatment options, and if necessary, the need for more tests. Vaccines  Your health care provider may recommend certain vaccines, such as:  Influenza vaccine. This is recommended every year.  Tetanus, diphtheria, and acellular pertussis (Tdap, Td) vaccine. You may need a Td booster every 10 years.  Zoster vaccine. You may need this after age 104.  Pneumococcal 13-valent conjugate (PCV13) vaccine. One dose is recommended after age 76.  Pneumococcal polysaccharide (PPSV23) vaccine. One dose is recommended after age 33. Talk to your health care provider about which screenings and vaccines you need and how often you need them. This information is not intended to replace advice given to you by your health care provider. Make sure you discuss any questions you have with your health care provider. Document Released: 05/24/2015 Document Revised: 01/15/2016 Document Reviewed: 02/26/2015 Elsevier Interactive Patient Education  2017 [employer] Prevention in the Home Falls can cause injuries. They can happen to people of all ages. There are many things you can do to make your home safe and to help prevent falls. What can I do on the outside of my home?  Regularly fix the edges of walkways and driveways and fix any cracks.  Remove anything that might make you trip as you walk through a door, such as a raised step or threshold.  Trim any bushes or trees on the path to your home.  Use bright outdoor lighting.  Clear any walking paths of anything that might make someone trip, such as rocks or tools.  Regularly  check to see if handrails are loose or broken. Make sure that both sides of any steps have handrails.  Any raised decks and porches should have guardrails on the edges.  Have any leaves, snow, or ice cleared regularly.  Use sand or salt on walking paths during winter.  Clean up any spills in your garage right away. This includes oil or grease spills. What can I do in the bathroom?  Use night lights.  Install grab bars by the toilet and in the tub and shower. Do not use towel bars as grab bars.  Use non-skid mats or decals in the tub or shower.  If you need to sit down in the shower, use a plastic, non-slip stool.  Keep the floor dry. Clean up any water that spills on the floor as soon as it happens.  Remove soap buildup in the tub or shower regularly.  Attach bath mats securely with double-sided non-slip rug tape.  Do not have throw rugs and other things on the floor that can make you trip. What can I do in the bedroom?  Use night lights.  Make sure that you have a  light by your bed that is easy to reach.  Do not use any sheets or blankets that are too big for your bed. They should not hang down onto the floor.  Have a firm chair that has side arms. You can use this for support while you get dressed.  Do not have throw rugs and other things on the floor that can make you trip. What can I do in the kitchen?  Clean up any spills right away.  Avoid walking on wet floors.  Keep items that you use a lot in easy-to-reach places.  If you need to reach something above you, use a strong step stool that has a grab bar.  Keep electrical cords out of the way.  Do not use floor polish or wax that makes floors slippery. If you must use wax, use non-skid floor wax.  Do not have throw rugs and other things on the floor that can make you trip. What can I do with my stairs?  Do not leave any items on the stairs.  Make sure that there are handrails on both sides of the stairs and  use them. Fix handrails that are broken or loose. Make sure that handrails are as long as the stairways.  Check any carpeting to make sure that it is firmly attached to the stairs. Fix any carpet that is loose or worn.  Avoid having throw rugs at the top or bottom of the stairs. If you do have throw rugs, attach them to the floor with carpet tape.  Make sure that you have a light switch at the top of the stairs and the bottom of the stairs. If you do not have them, ask someone to add them for you. What else can I do to help prevent falls?  Wear shoes that:  Do not have high heels.  Have rubber bottoms.  Are comfortable and fit you well.  Are closed at the toe. Do not wear sandals.  If you use a stepladder:  Make sure that it is fully opened. Do not climb a closed stepladder.  Make sure that both sides of the stepladder are locked into place.  Ask someone to hold it for you, if possible.  Clearly mark and make sure that you can see:  Any grab bars or handrails.  First and last steps.  Where the edge of each step is.  Use tools that help you move around (mobility aids) if they are needed. These include:  Canes.  Walkers.  Scooters.  Crutches.  Turn on the lights when you go into a dark area. Replace any light bulbs as soon as they burn out.  Set up your furniture so you have a clear path. Avoid moving your furniture around.  If any of your floors are uneven, fix them.  If there are any pets around you, be aware of where they are.  Review your medicines with your doctor. Some medicines can make you feel dizzy. This can increase your chance of falling. Ask your doctor what other things that you can do to help prevent falls. This information is not intended to replace advice given to you by your health care provider. Make sure you discuss any questions you have with your health care provider. Document Released: 02/21/2009 Document Revised: 10/03/2015 Document  Reviewed: 06/01/2014 Elsevier Interactive Patient Education  2017 [employer].

## 2019-11-30 NOTE — Progress Notes (Addendum)
Subjective:   Kimberly Schmitt is a 69 y.o. female who presents for Medicare Annual (Subsequent) preventive examination.  Review of Systems    No ROS.  Medicare Wellness Virtual Visit.    Cardiac Risk Factors include: advanced age (>89men, >110 women)     Objective:    Today's Vitals   11/30/19 0910  Weight: 95 lb (43.1 kg)  Height: 4\' 11"  (1.499 m)   Body mass index is 19.19 kg/m.  Advanced Directives 11/30/2019 11/29/2018 10/04/2015  Does Patient Have a Medical Advance Directive? No Yes No;Yes  Type of Advance Directive - [place];Living will [place];Living will  Does patient want to make changes to medical advance directive? - No - Patient declined -  Copy of [place] in Chart? - No - copy requested -  Would patient like information on creating a medical advance directive? No - Patient declined - -    Current Medications (verified) Outpatient Encounter Medications as of 11/30/2019  Medication Sig   aspirin 81 MG tablet Take 81 mg by mouth daily.   atorvastatin (LIPITOR) 20 MG tablet TAKE 1 TABLET (20 MG TOTAL) BY MOUTH DAILY.   cholecalciferol (VITAMIN D) 1000 units tablet Take 1,000 Units by mouth daily.   LORazepam (ATIVAN) 1 MG tablet TAKE 1 TABLET (1 MG TOTAL) BY MOUTH 2 (TWO) TIMES DAILY. AS NEEDED FOR INSOMNIA AND ANXIETY   PARoxetine (PAXIL) 10 MG tablet TAKE 1 TABLET (10 MG TOTAL) BY MOUTH DAILY. AFTER DINNER   Probiotic Product (PROBIOTIC DAILY PO) Take by mouth.   No facility-administered encounter medications on file as of 11/30/2019.    Allergies (verified) Patient has no known allergies.   History: Past Medical History:  Diagnosis Date   Hyperlipidemia    220 per patient   Personal history of tobacco use, presenting hazards to health 06/12/2015   Visit for preventive health examination 04/17/2013   .    Past Surgical History:  Procedure Laterality Date   APPENDECTOMY  1968   Family History   Problem Relation Age of Onset   Cancer Brother 31       Pancreatic and Liver cancer   Cancer Maternal Grandmother    Breast cancer Maternal Grandmother 77   Cancer Father 36       colon or  lung CA   Diabetes Sister    Stroke Maternal Aunt    Social History   Socioeconomic History   Marital status: Married    Spouse name: Not on file   Number of children: Not on file   Years of education: Not on file   Highest education level: Not on file  Occupational History   Not on file  Tobacco Use   Smoking status: Current Every Day Smoker    Packs/day: 0.50    Years: 45.00    Pack years: 22.50    Types: Cigarettes   Smokeless tobacco: Never Used  Substance and Sexual Activity   Alcohol use: No    Alcohol/week: 0.0 standard drinks   Drug use: No   Sexual activity: Not on file  Other Topics Concern   Not on file  Social History Narrative   Not on file   Social Determinants of Health   Financial Resource Strain: Low Risk    Difficulty of Paying Living Expenses: Not hard at all  Food Insecurity:    Worried About Charity fundraiser in the Last Year:    [employer] of  Food in the Last Year:   Transportation Needs: No Transportation Needs   Lack of Transportation (Medical): No   Lack of Transportation (Non-Medical): No  Physical Activity: Sufficiently Active   Days of Exercise per Week: 5 days   Minutes of Exercise per Session: 30 min  Stress:    Feeling of Stress :   Social Connections:    Frequency of Communication with Friends and Family:    Frequency of Social Gatherings with Friends and Family:    Attends Religious Services:    Active Member of Clubs or Organizations:    Attends Music therapist:    Marital Status:     Tobacco Counseling Ready to quit: Not Answered Counseling given: Not Answered   Clinical Intake:  Pre-visit preparation completed: Yes        Diabetes: No  How often do you need to have someone help you when you read  instructions, pamphlets, or other written materials from your doctor or pharmacy?: 1 - Never   Interpreter Needed?: No      Activities of Daily Living In your present state of health, do you have any difficulty performing the following activities: 11/30/2019  Hearing? N  Vision? N  Difficulty concentrating or making decisions? N  Walking or climbing stairs? N  Dressing or bathing? N  Doing errands, shopping? N  Preparing Food and eating ? N  Using the Toilet? N  In the past six months, have you accidently leaked urine? N  Do you have problems with loss of bowel control? N  Managing your Medications? N  Managing your Finances? N  Housekeeping or managing your Housekeeping? N  Some recent data might be hidden    Patient Care Team: Crecencio Mc, MD as PCP - General (Internal Medicine)  Indicate any recent Medical Services you may have received from other than [facility] providers in the past year (date may be approximate).     Assessment:   This is a routine wellness examination for [place].  I connected with Kimberly Schmitt today by telephone and verified that I am speaking with the correct person using two identifiers. Location patient: home Location provider: work Persons participating in the virtual visit: patient, Marine scientist.    I discussed the limitations, risks, security and privacy concerns of performing an evaluation and management service by telephone and the availability of in person appointments. The patient expressed understanding and verbally consented to this telephonic visit.    Interactive audio and video telecommunications were attempted between this provider and patient, however failed, due to patient having technical difficulties OR patient did not have access to video capability.  We continued and completed visit with audio only.  Some vital signs may be absent or patient reported.   Hearing/Vision screen  Hearing Screening   125Hz  250Hz  500Hz  1000Hz  2000Hz  3000Hz   4000Hz  6000Hz  8000Hz   Right ear:           Left ear:           Comments: Patient is able to hear conversational tones without difficulty.  No issues reported.  Vision Screening Comments: Followed by [facility] Wears corrective lenses Visual acuity not assessed, virtual visit.  They have seen their ophthalmologist in the last 12 months.     Dietary issues and exercise activities discussed: Current Exercise Habits: Home exercise routine, Type of exercise: walking, Time (Minutes): 30, Frequency (Times/Week): 5, Weekly Exercise (Minutes/Week): 150, Intensity: Mild  Goals       Patient Stated  Follow up with Primary Care Provider (pt-stated)      Patient to reschedule annual CT scan and complete Follow with pcp as needed      Other     Quit Smoking       Depression Screen PHQ 2/9 Scores 11/30/2019 11/29/2018 06/22/2018 06/16/2017 05/13/2016 06/05/2015 05/02/2014  PHQ - 2 Score 1 0 1 0 0 0 0  PHQ- 9 Score - - 2 2 - - -    Fall Risk Fall Risk  11/30/2019 08/25/2019 06/28/2019 11/29/2018 06/16/2017  Falls in the past year? 0 0 0 0 No  Number falls in past yr: 0 - - - -  Injury with Fall? 0 - - - -  Follow up Falls evaluation completed Falls evaluation completed Falls evaluation completed - -    Handrails in use when climbing stairs? Yes  Home free of loose throw rugs in walkways, pet beds, electrical cords, etc? Yes  Adequate lighting in your home to reduce risk of falls? Yes   ASSISTIVE DEVICES UTILIZED TO PREVENT FALLS: Use of a cane, walker or w/c? No  Grab bars in the bathroom? Yes  Shower chair or bench in shower? No  Elevated toilet seat or a handicapped toilet? No   TIMED UP AND GO:  Was the test performed? No .    Cognitive Function:  Patient is alert and oriented x3.    6CIT Screen 11/30/2019 11/29/2018  What Year? 0 points 0 points  What month? 0 points 0 points  What time? 0 points 0 points  Count back from 20 - 0 points  Months in reverse 0 points 0  points  Repeat phrase - 0 points  Total Score - 0    Immunizations Immunization History  Administered Date(s) Administered   Influenza Split 01/16/2013, 01/27/2014, 01/24/2015   Influenza Whole 03/04/2011, 01/27/2012   Influenza, High Dose Seasonal PF 02/03/2017, 01/11/2018   Influenza-Unspecified 01/24/2016, 02/05/2017, 02/07/2019   PFIZER SARS-COV-2 Vaccination 07/05/2019, 07/26/2019   Pneumococcal Conjugate-13 05/02/2014   Pneumococcal Polysaccharide-23 04/11/2012, 06/16/2017   Tdap 04/07/2011   Zoster Recombinat (Shingrix) 09/26/2017, 02/03/2018   Health Maintenance There are no preventive care reminders to display for this patient. Health Maintenance  Topic Date Due   INFLUENZA VACCINE  12/10/2019   MAMMOGRAM  07/25/2020   TETANUS/TDAP  04/06/2021   COLONOSCOPY  04/24/2021   DEXA SCAN  Completed   COVID-19 Vaccine  Completed   Hepatitis C Screening  Completed   PNA vac Low Risk Adult  Completed   Dental Screening: Recommended annual dental exams for proper oral hygiene. Visits every 6 months.   Community Resource Referral / Chronic Care Management: CRR required this visit?  No   CCM required this visit?  No      Plan:    Keep all routine maintenance appointments.   Follow up 03/06/20 @ 8:30  I have personally reviewed and noted the following in the patient's chart:   Medical and social history Use of alcohol, tobacco or illicit drugs  Current medications and supplements Functional ability and status Nutritional status Physical activity Advanced directives List of other physicians Hospitalizations, surgeries, and ER visits in previous 12 months Vitals Screenings to include cognitive, depression, and falls Referrals and appointments  In addition, I have reviewed and discussed with patient certain preventive protocols, quality metrics, and best practice recommendations. A written personalized care plan for preventive services as well as general preventive  health recommendations were provided to patient via mail.  OBrien-Blaney, Zyiah Withington L, LPN   6/48/4720      I have reviewed the above information and agree with above.   Deborra Medina, MD

## 2020-01-13 ENCOUNTER — Telehealth: Payer: Self-pay | Admitting: *Deleted

## 2020-01-13 ENCOUNTER — Emergency Department
Admission: EM | Admit: 2020-01-13 | Discharge: 2020-01-13 | Disposition: A | Discharge status: Home / Self Care | Payer: Medicare Other | Attending: Emergency Medicine | Admitting: Emergency Medicine

## 2020-01-13 ENCOUNTER — Other Ambulatory Visit: Payer: Self-pay

## 2020-01-13 DIAGNOSIS — F17210 Nicotine dependence, cigarettes, uncomplicated: Secondary | ICD-10-CM | POA: Insufficient documentation

## 2020-01-13 DIAGNOSIS — T7840XA Allergy, unspecified, initial encounter: Secondary | ICD-10-CM

## 2020-01-13 DIAGNOSIS — R531 Weakness: Secondary | ICD-10-CM | POA: Diagnosis not present

## 2020-01-13 DIAGNOSIS — Z7982 Long term (current) use of aspirin: Secondary | ICD-10-CM | POA: Insufficient documentation

## 2020-01-13 DIAGNOSIS — Z79899 Other long term (current) drug therapy: Secondary | ICD-10-CM | POA: Insufficient documentation

## 2020-01-13 DIAGNOSIS — I2510 Atherosclerotic heart disease of native coronary artery without angina pectoris: Secondary | ICD-10-CM | POA: Insufficient documentation

## 2020-01-13 LAB — COMPREHENSIVE METABOLIC PANEL
ALT: 18 U/L (ref 0–44)
AST: 25 U/L (ref 15–41)
Albumin: 4.3 g/dL (ref 3.5–5.0)
Alkaline Phosphatase: 40 U/L (ref 38–126)
Anion gap: 12 (ref 5–15)
BUN: 17 mg/dL (ref 8–23)
CO2: 25 mmol/L (ref 22–32)
Calcium: 9.1 mg/dL (ref 8.9–10.3)
Chloride: 102 mmol/L (ref 98–111)
Creatinine, Ser: 0.82 mg/dL (ref 0.44–1.00)
GFR calc Af Amer: >60 mL/min (ref >60)
GFR calc non Af Amer: >60 mL/min (ref >60)
Glucose, Bld: 105 mg/dL — ABNORMAL HIGH (ref 70–99)
Potassium: 3.5 mmol/L (ref 3.5–5.1)
Sodium: 139 mmol/L (ref 135–145)
Total Bilirubin: 0.9 mg/dL (ref 0.3–1.2)
Total Protein: 6.6 g/dL (ref 6.5–8.1)

## 2020-01-13 LAB — CBC WITH DIFFERENTIAL/PLATELET
Abs Immature Granulocytes: 0.06 10*3/uL (ref 0.00–0.07)
Basophils Absolute: 0 10*3/uL (ref 0.0–0.1)
Basophils Relative: 0 %
Eosinophils Absolute: 0 10*3/uL (ref 0.0–0.5)
Eosinophils Relative: 0 %
HCT: 45.3 % (ref 36.0–46.0)
Hemoglobin: 15.1 g/dL — ABNORMAL HIGH (ref 12.0–15.0)
Immature Granulocytes: 0 %
Lymphocytes Relative: 12 %
Lymphs Abs: 2.2 10*3/uL (ref 0.7–4.0)
MCH: 31.3 pg (ref 26.0–34.0)
MCHC: 33.3 g/dL (ref 30.0–36.0)
MCV: 94 fL (ref 80.0–100.0)
Monocytes Absolute: 0.7 10*3/uL (ref 0.1–1.0)
Monocytes Relative: 4 %
Neutro Abs: 15 10*3/uL — ABNORMAL HIGH (ref 1.7–7.7)
Neutrophils Relative %: 84 %
Platelets: 296 10*3/uL (ref 150–400)
RBC: 4.82 MIL/uL (ref 3.87–5.11)
RDW: 14 % (ref 11.5–15.5)
WBC: 18 10*3/uL — ABNORMAL HIGH (ref 4.0–10.5)
nRBC: 0 % (ref 0.0–0.2)

## 2020-01-13 LAB — TROPONIN I (HIGH SENSITIVITY): Troponin I (High Sensitivity): 6 ng/L (ref <18)

## 2020-01-13 MED ORDER — SODIUM CHLORIDE 0.9 % IV SOLN
40.0000 mg | Freq: Once | INTRAVENOUS | Status: AC
  Administered 2020-01-13: 40 mg via INTRAVENOUS
  Filled 2020-01-13: qty 4

## 2020-01-13 MED ORDER — METHYLPREDNISOLONE SODIUM SUCC 125 MG IJ SOLR
125.0000 mg | Freq: Once | INTRAMUSCULAR | Status: AC
  Administered 2020-01-13: 125 mg via INTRAVENOUS
  Filled 2020-01-13: qty 2

## 2020-01-13 MED ORDER — PREDNISONE 10 MG PO TABS: ORAL_TABLET | ORAL | 0 refills | Status: AC

## 2020-01-13 NOTE — ED Triage Notes (Signed)
FIRST NURSE NOTE:  Pt c/o itching to arms, redness, no respiratory distress noted at this time.

## 2020-01-13 NOTE — Telephone Encounter (Signed)
Pt notified that lung cancer screening imaging is due currently or in the near future. Verified that she currently smokes 1/2 pack of cigarettes per day. Appointment scheduled on 02/01/2020 at 4:30pm.

## 2020-01-13 NOTE — ED Triage Notes (Signed)
Patient reports burning to palms of hands and itching since 7pm  Unknown allergan.  No respiratory distress noted.

## 2020-01-14 NOTE — ED Provider Notes (Signed)
[facility] Emergency Department Provider Note  ____________________________________________   First MD Initiated Contact with Patient 01/13/20 2134     (approximate)  I have reviewed the triage vital signs and the nursing notes.   HISTORY  Chief Complaint Allergic Reaction   HPI Kimberly Schmitt is a 69 y.o. female who presents to the emergency department for evaluation of what she believes to be an allergic reaction.  The patient states that she started experiencing itching that began on her hands and progressed to her full body at approximately 7 PM.  The patient does not have any known allergies.  Patient states this is occurred one time before approximately 2 months ago but was not as severe and she was able to self treat with Benadryl.  She states that she tried Benadryl today, but did not notice any improvement.  Also, the patient states that earlier today she was at a festival type of event with a family member where she was feeling weak and she believes this is because she had not eaten.  She then ate a hotdog and a funnel cake and noticed that her weakness felt improved.  She has also had some unexplained weight loss that she associates with bereavement from the passing of her husband.         Past Medical History:  Diagnosis Date  . Hyperlipidemia    220 per patient  . Personal history of tobacco use, presenting hazards to health 06/12/2015  . Visit for preventive health examination 04/17/2013   .     Patient Active Problem List   Diagnosis Date Noted  . Osteoporosis 07/29/2019  . GAD (generalized anxiety disorder) 02/20/2018  . Insomnia secondary to anxiety 12/18/2017  . Atherosclerosis of native coronary artery of native heart without angina pectoris 05/16/2016  . Solitary pulmonary nodule 05/16/2016  . Vitamin D deficiency 05/16/2016  . Dyspareunia 05/04/2014  . Hyperlipidemia 04/21/2013  . Screening for colon cancer 04/08/2011  .  Encounter for preventive health examination 04/08/2011  . Screening for cervical cancer 04/08/2011  . Tobacco abuse 04/07/2011  . Tobacco abuse counseling 04/07/2011    Past Surgical History:  Procedure Laterality Date  . APPENDECTOMY  1968    Prior to Admission medications   Medication Sig Start Date End Date Taking? Authorizing Provider  aspirin 81 MG tablet Take 81 mg by mouth daily.    [provider]  atorvastatin (LIPITOR) 20 MG tablet TAKE 1 TABLET (20 MG TOTAL) BY MOUTH DAILY. 01/31/19   Crecencio Mc, MD  cholecalciferol (VITAMIN D) 1000 units tablet Take 1,000 Units by mouth daily.    [provider]  LORazepam (ATIVAN) 1 MG tablet TAKE 1 TABLET (1 MG TOTAL) BY MOUTH 2 (TWO) TIMES DAILY. AS NEEDED FOR INSOMNIA AND ANXIETY 07/27/19   Crecencio Mc, MD  PARoxetine (PAXIL) 10 MG tablet TAKE 1 TABLET (10 MG TOTAL) BY MOUTH DAILY. AFTER DINNER 07/27/19   Crecencio Mc, MD  predniSONE (DELTASONE) 10 MG tablet Take 6 tablets (60 mg total) by mouth daily for 1 day, THEN 5 tablets (50 mg total) daily for 1 day, THEN 4 tablets (40 mg total) daily for 1 day, THEN 3 tablets (30 mg total) daily for 1 day, THEN 2 tablets (20 mg total) daily for 1 day, THEN 1 tablet (10 mg total) daily for 1 day. 01/13/20 01/19/20  Marlana Salvage, PA  Probiotic Product (PROBIOTIC DAILY PO) Take by mouth.    [provider]    Allergies Patient has no known allergies.  Family History  Problem Relation Age of Onset  . Cancer Brother 63       Pancreatic and Liver cancer  . Cancer Maternal Grandmother   . Breast cancer Maternal Grandmother 10  . Cancer Father 20       colon or  lung CA  . Diabetes Sister   . Stroke Maternal Aunt     Social History Social History   Tobacco Use  . Smoking status: Current Every Day Smoker    Packs/day: 0.50    Years: 45.00    Pack years: 22.50    Types: Cigarettes  . Smokeless tobacco: Never Used  Substance Use Topics  . Alcohol use:  No    Alcohol/week: 0.0 standard drinks  . Drug use: No    Review of Systems Constitutional: No fever/chills Eyes: No visual changes. ENT: No sore throat.  No facial swelling Cardiovascular: Denies chest pain. Respiratory: Denies shortness of breath. Gastrointestinal: No abdominal pain.  No nausea, no vomiting.  No diarrhea.  No constipation. Genitourinary: Negative for dysuria. Musculoskeletal: Negative for back pain. Skin: + Diffuse rash, itching Neurological: Negative for headaches, focal weakness or numbness.   ____________________________________________   PHYSICAL EXAM:  VITAL SIGNS: ED Triage Vitals  Enc Vitals Group     BP 01/13/20 2100 (!) 154/86     Pulse Rate 01/13/20 2100 (!) 56     Resp 01/13/20 2100 17     Temp 01/13/20 2100 97.7 F (36.5 C)     Temp Source 01/13/20 2100 Oral     SpO2 01/13/20 2100 100 %     Weight 01/13/20 2059 95 lb (43.1 kg)     Height 01/13/20 2059 4\' 11"  (1.499 m)     Head Circumference --      Peak Flow --      Pain Score 01/13/20 2058 0     Pain Loc --      Pain Edu? --      Excl. in [place]? --     Constitutional: Alert and oriented. Well appearing and in no acute distress. Eyes: Conjunctivae have slight yellow discoloration. PERRL. EOMI. Head: Atraumatic. Nose: No congestion/rhinnorhea. Mouth/Throat: Mucous membranes are moist.  Oropharynx non-erythematous.  No swelling noted. Neck: No stridor.   Cardiovascular: Normal rate, regular rhythm. Grossly normal heart sounds.  Good peripheral circulation. Respiratory: Normal respiratory effort.  No retractions. Lungs CTAB. Gastrointestinal: Soft and nontender. No distention.  Musculoskeletal: No lower extremity tenderness nor edema.  No joint effusions. Neurologic:  Normal speech and language. No gross focal neurologic deficits are appreciated. No gait instability. Skin: There is a diffuse erythematous rash consisting of macular patches.  These are more prominent on the trunk than the  extremities though they are present on the extremities.  There is some mild excoriation where the patient has been scratching at the inner and outer thighs.  Skin tone of the distal lower extremities appears slightly more yellow than the rest of her skin. Psychiatric: Mood and affect are normal. Speech and behavior are normal.  ____________________________________________   LABS (all labs ordered are listed, but only abnormal results are displayed)  Labs Reviewed  CBC WITH DIFFERENTIAL/PLATELET - Abnormal; Notable for the following components:      Result Value   WBC 18.0 (*)    Hemoglobin 15.1 (*)    Neutro Abs 15.0 (*)    All other components within normal limits  COMPREHENSIVE METABOLIC PANEL -  Abnormal; Notable for the following components:   Glucose, Bld 105 (*)    All other components within normal limits  TROPONIN I (HIGH SENSITIVITY)  TROPONIN I (HIGH SENSITIVITY)   ____________________________________________  EKG  EKG performed today reveals a sinus rhythm with PVCs.  The PVCs appear in bigeminy.  Ventricular rate is 93 bpm.  There is no appreciated ST-T wave changes.  No evidence of acute ischemia.  ____________________________________________   INITIAL IMPRESSION / ASSESSMENT AND PLAN / ED COURSE  As part of my medical decision making, I reviewed the following data within the [place] notes reviewed and incorporated        Verble Styron is a 69 yo who reports to the emergency department for evaluation of what she believes to be an allergic reaction.  No known allergens that the patient is aware of.  Patient also had an episode of feeling weak earlier in the day.  She denies chest pain, facial swelling, shortness of breath, abdominal pain, vomiting.  Denies syncope or near syncope and she states she did feel improved after she ate some food.  Physical exam reveals an urticarial diffuse rash and some mild yellow discoloration to her  conjunctivae as well as her lower legs.  Vital signs are unremarkable.   Differentials of this diffuse rash include allergic reaction, hyperbilirubinemia.  Labs were performed to evaluate the patient's earlier weakness as well as liver function.  Initial troponin is normal at 6, CMP including liver function is normal.  CBC does reveal a bumped white count of 18.  This could represent stress as there are no other identifiable symptoms or physical exam findings suggestive of infection.  The patient's  EKG is also not suggestive of any acute ischemic changes.   Initial treatment includes Solu-Medrol as well as Pepcid given IV.  Upon recheck of the patient 30 minutes after administration of these medications, she is feeling greatly improved with less itching and improvement in the appearance of the rash.  The patient will be discharged with a prednisone taper pack for the next 6 days.  The patient is advised he should follow-up with her primary care for consideration of allergy testing given that this is the second occurrence and that it was worse the second time.  Other considerations may include testing for alpha gal given that the patient ate a hot dog a few hours prior to the incident and typically does not eat beef as well as the fact that she reports several tick bites earlier this year.  The patient is amenable with this plan and recognizes the importance of primary care follow-up.   ____________________________________________   FINAL CLINICAL IMPRESSION(S) / ED DIAGNOSES  Final diagnoses:  Allergic reaction, initial encounter     ED Discharge Orders         Ordered    predniSONE (DELTASONE) 10 MG tablet        01/13/20 2319           Note:  This document was prepared using Dragon voice recognition software and may include unintentional dictation errors.    Marlana Salvage, PA 01/14/20 1736    Vladimir Crofts, MD 01/14/20 [phone]

## 2020-01-16 ENCOUNTER — Encounter: Payer: Self-pay | Admitting: Internal Medicine

## 2020-01-16 ENCOUNTER — Other Ambulatory Visit: Payer: Self-pay

## 2020-01-16 ENCOUNTER — Ambulatory Visit (INDEPENDENT_AMBULATORY_CARE_PROVIDER_SITE_OTHER): Payer: Medicare Other | Admitting: Internal Medicine

## 2020-01-16 VITALS — BP 122/78 | HR 99 | Temp 98.2°F | Resp 15 | Ht 59.0 in | Wt 94.6 lb

## 2020-01-16 DIAGNOSIS — R5383 Other fatigue: Secondary | ICD-10-CM

## 2020-01-16 DIAGNOSIS — L509 Urticaria, unspecified: Secondary | ICD-10-CM | POA: Diagnosis not present

## 2020-01-16 NOTE — Assessment & Plan Note (Signed)
2 episodes of spontaneous hives.  Start allegra twice daily,  Famotidine twice daily .  Rule out complement deficiency, autoimmune disease

## 2020-01-16 NOTE — Progress Notes (Signed)
Subjective:  Patient ID: Kimberly Schmitt, female    DOB: 01-May-1951  Age: 69 y.o. MRN: [id number]  CC: The primary encounter diagnosis was Urticaria. A diagnosis of Fatigue, unspecified type was also pertinent to this visit.  HPI [name] presents for ER follow up  This visit occurred during the SARS-CoV-2 public health emergency.  Safety protocols were in place, including screening questions prior to the visit, additional usage of staff PPE, and extensive cleaning of exam room while observing appropriate contact time as indicated for disinfecting solutions.   Patient was treated in ER on Saturday Sept 4  after developing an intensely pruritic full body rash that started on her palms ,  spread to her groin and then on to her trunk and extremities,  The rash   occurred at 7:30 pm on Saturday after spending several hours walking around the [employer].  She recalls  eating hot dogs and a funnel cake at the [place] coming  home around 5 pm . .  2nd episode; first episode occurred 8 weeks ago and woke her from sleep  Was not diffuse the first time , reaction was limited to pelvic area . Both episodes also resulted in a single blistering lesion on each side of groin,  Nowhere else.  No angioedema      Outpatient Medications Prior to Visit  Medication Sig Dispense Refill  . aspirin 81 MG tablet Take 81 mg by mouth daily.    Marland Kitchen atorvastatin (LIPITOR) 20 MG tablet TAKE 1 TABLET (20 MG TOTAL) BY MOUTH DAILY. 90 tablet 3  . cholecalciferol (VITAMIN D) 1000 units tablet Take 1,000 Units by mouth daily.    Marland Kitchen LORazepam (ATIVAN) 1 MG tablet TAKE 1 TABLET (1 MG TOTAL) BY MOUTH 2 (TWO) TIMES DAILY. AS NEEDED FOR INSOMNIA AND ANXIETY 30 tablet 5  . PARoxetine (PAXIL) 10 MG tablet TAKE 1 TABLET (10 MG TOTAL) BY MOUTH DAILY. AFTER DINNER 90 tablet 2  . Probiotic Product (PROBIOTIC DAILY PO) Take by mouth.    . predniSONE (DELTASONE) 10 MG tablet Take 6 tablets (60 mg total) by mouth daily  for 1 day, THEN 5 tablets (50 mg total) daily for 1 day, THEN 4 tablets (40 mg total) daily for 1 day, THEN 3 tablets (30 mg total) daily for 1 day, THEN 2 tablets (20 mg total) daily for 1 day, THEN 1 tablet (10 mg total) daily for 1 day. (Patient not taking: Reported on 01/16/2020) 21 tablet 0   No facility-administered medications prior to visit.    Review of Systems;  Patient denies headache, fevers, malaise, unintentional weight loss, skin rash, eye pain, sinus congestion and sinus pain, sore throat, dysphagia,  hemoptysis , cough, dyspnea, wheezing, chest pain, palpitations, orthopnea, edema, abdominal pain, nausea, melena, diarrhea, constipation, flank pain, dysuria, hematuria, urinary  Frequency, nocturia, numbness, tingling, seizures,  Focal weakness, Loss of consciousness,  Tremor, insomnia, depression, anxiety, and suicidal ideation.      Objective:  BP 122/78 (BP Location: Left Arm, Patient Position: Sitting, Cuff Size: Normal)   Pulse 99   Temp 98.2 F (36.8 C) (Oral)   Resp 15   Ht 4\' 11"  (1.499 m)   Wt 94 lb 9.6 oz (42.9 kg)   SpO2 99%   BMI 19.11 kg/m   BP Readings from Last 3 Encounters:  01/16/20 122/78  01/13/20 (!) 148/80  08/25/19 116/60    Wt Readings from Last 3 Encounters:  01/16/20 94 lb 9.6  oz (42.9 kg)  01/13/20 95 lb (43.1 kg)  11/30/19 95 lb (43.1 kg)    General appearance: alert, cooperative and appears stated age Ears: normal TM's and external ear canals both ears Throat: lips, mucosa, and tongue normal; teeth and gums normal Neck: no adenopathy, no carotid bruit, supple, symmetrical, trachea midline and thyroid not enlarged, symmetric, no tenderness/mass/nodules Back: symmetric, no curvature. ROM normal. No CVA tenderness. Lungs: clear to auscultation bilaterally Heart: regular rate and rhythm, S1, S2 normal, no murmur, click, rub or gallop Abdomen: soft, non-tender; bowel sounds normal; no masses,  no organomegaly Pulses: 2+ and  symmetric Skin: Skin color, texture, turgor normal. No rashes or lesions Lymph nodes: Cervical, supraclavicular, and axillary nodes normal.  No results found for: HGBA1C  Lab Results  Component Value Date   CREATININE 0.82 01/13/2020   CREATININE 0.90 06/28/2019   CREATININE 0.71 06/22/2018    Lab Results  Component Value Date   WBC 18.0 (H) 01/13/2020   HGB 15.1 (H) 01/13/2020   HCT 45.3 01/13/2020   PLT 296 01/13/2020   GLUCOSE 105 (H) 01/13/2020   CHOL 146 06/28/2019   TRIG 56 06/28/2019   HDL 66 06/28/2019   LDLDIRECT 147.5 04/17/2013   LDLCALC 66 06/28/2019   ALT 18 01/13/2020   AST 25 01/13/2020   NA 139 01/13/2020   K 3.5 01/13/2020   CL 102 01/13/2020   CREATININE 0.82 01/13/2020   BUN 17 01/13/2020   CO2 25 01/13/2020   TSH 1.15 06/28/2019    No results found.  Assessment & Plan:   Problem List Items Addressed This Visit      Unprioritized   Urticaria - Primary    2 episodes of spontaneous hives.  Start allegra twice daily,  Famotidine twice daily .  Rule out complement deficiency, autoimmune disease      Relevant Orders   C4 complement   Sedimentation rate   CBC with Differential/Platelet   IgE    Other Visit Diagnoses    Fatigue, unspecified type       Relevant Orders   TSH     I provided  30 minutes of  face-to-face time during this encounter reviewing patient's current problems and past ER visits , labs and imaging studies, providing counseling on the above mentioned problems , and coordination  of care .  I am having Vesta A. Kirchgessner maintain her aspirin, Probiotic Product (PROBIOTIC DAILY PO), cholecalciferol, atorvastatin, PARoxetine, LORazepam, and predniSONE.  No orders of the defined types were placed in this encounter.   There are no discontinued medications.  Follow-up: No follow-ups on file.   Kimberly Mc, MD

## 2020-01-16 NOTE — Patient Instructions (Addendum)
Start taking Fexofenadine (generic Allegra)  180 mg twice daily and famotidine 20 mg twice daily   Hives Hives are itchy, red, swollen areas on your skin. Hives can show up on any part of your body. Hives often fade within 24 hours (acute hives). New hives can show up after old ones fade. This can go on for many days or weeks (chronic hives). Hives do not spread from person to person (are not contagious). Hives are caused by your body's response to something that you are allergic to (allergen). These are sometimes called triggers. You can get hives right after being around a trigger, or hours later. What are the causes?  Allergies to foods.  Insect bites or stings.  Pollen.  Pets.  Latex.  Chemicals.  Spending time in sunlight, heat, or cold.  Exercise.  Stress.  Some medicines.  Viruses. This includes the common cold.  Infections caused by germs (bacteria).  Allergy shots.  Blood transfusions. Sometimes, the cause is not known. What increases the risk?  Being a woman.  Being allergic to foods such as: ? Citrus fruits. ? Milk. ? Eggs. ? Peanuts. ? Tree nuts. ? Shellfish.  Being allergic to: ? Medicines. ? Latex. ? Insects. ? Animals. ? Pollen. What are the signs or symptoms?   Raised, itchy, red or white bumps or patches on your skin. These areas may: ? Get large and swollen. ? Change in shape and location. ? Stand alone or connect to each other over a large area of skin. ? Sting or hurt. ? Turn white when pressed in the center (blanch). In very bad cases, your hands, feet, and face may also get swollen. This may happen if hives start deeper in your skin. How is this treated? Treatment for this condition depends on your symptoms. Treatment may include:  Using cool, wet cloths (cool compresses) or taking cool showers to stop the itching.  Medicines that help: ? Relieve itching (antihistamines). ? Reduce swelling (corticosteroids). ? Treat  infection (antibiotics).  A medicine (omalizumab) that is given as a shot (injection). Your doctor may prescribe this if you have hives that do not get better even after other treatments.  In very bad cases, you may need a shot of a medicine called epinephrineto prevent a life-threatening allergic reaction (anaphylaxis). Follow these instructions at home: Medicines  Take or apply over-the-counter and prescription medicines only as told by your doctor.  If you were prescribed an antibiotic medicine, use it as told by your doctor. Do not stop using it even if you start to feel better. Skin care  Apply cool, wet cloths to the hives.  Do not scratch your skin. Do not rub your skin. General instructions  Do not take hot showers or baths. This can make itching worse.  Do not wear tight clothes.  Use sunscreen and wear clothes that cover your skin when you are outside.  Avoid any triggers that cause your hives. Keep a journal to help track what causes your hives. Write down: ? What medicines you take. ? What you eat and drink. ? What products you use on your skin.  Keep all follow-up visits as told by your doctor. This is important. Contact a doctor if:  Your symptoms are not better with medicine.  Your joints hurt or are swollen. Get help right away if:  You have a fever.  You have pain in your belly (abdomen).  Your tongue or lips are swollen.  Your eyelids are swollen.  Your  chest or throat feels tight.  You have trouble breathing or swallowing. These symptoms may be an emergency. Do not wait to see if the symptoms will go away. Get medical help right away. Call your local emergency services (911 in the [place] Do not drive yourself to the hospital. Summary  Hives are itchy, red, swollen areas on your skin.  Treatment for this condition depends on your symptoms.  Avoid things that cause your hives. Keep a journal to help track what causes your hives.  Take and  apply over-the-counter and prescription medicines only as told by your doctor.  Keep all follow-up visits as told by your doctor. This is important. This information is not intended to replace advice given to you by your health care provider. Make sure you discuss any questions you have with your health care provider. Document Revised: 11/10/2017 Document Reviewed: 11/10/2017 Elsevier Patient Education  [place].

## 2020-01-17 LAB — CBC WITH DIFFERENTIAL/PLATELET
Basophils Absolute: 0.1 10*3/uL (ref 0.0–0.1)
Basophils Relative: 0.8 % (ref 0.0–3.0)
Eosinophils Absolute: 0 10*3/uL (ref 0.0–0.7)
Eosinophils Relative: 0.5 % (ref 0.0–5.0)
HCT: 43 % (ref 36.0–46.0)
Hemoglobin: 14.2 g/dL (ref 12.0–15.0)
Lymphocytes Relative: 30.2 % (ref 12.0–46.0)
Lymphs Abs: 3.1 10*3/uL (ref 0.7–4.0)
MCHC: 33 g/dL (ref 30.0–36.0)
MCV: 93.3 fl (ref 78.0–100.0)
Monocytes Absolute: 0.7 10*3/uL (ref 0.1–1.0)
Monocytes Relative: 6.6 % (ref 3.0–12.0)
Neutro Abs: 6.4 10*3/uL (ref 1.4–7.7)
Neutrophils Relative %: 61.9 % (ref 43.0–77.0)
Platelets: 269 10*3/uL (ref 150.0–400.0)
RBC: 4.6 Mil/uL (ref 3.87–5.11)
RDW: 14.4 % (ref 11.5–15.5)
WBC: 10.3 10*3/uL (ref 4.0–10.5)

## 2020-01-17 LAB — IGE: IgE (Immunoglobulin E), Serum: 114 kU/L (ref <=114)

## 2020-01-17 LAB — SEDIMENTATION RATE: Sed Rate: 4 mm/hr (ref 0–30)

## 2020-01-17 LAB — TSH: TSH: 1.11 u[IU]/mL (ref 0.35–4.50)

## 2020-01-17 LAB — C4 COMPLEMENT: C4 Complement: 14 mg/dL — ABNORMAL LOW (ref 15–57)

## 2020-01-20 ENCOUNTER — Other Ambulatory Visit: Payer: Self-pay | Admitting: Internal Medicine

## 2020-01-20 DIAGNOSIS — L509 Urticaria, unspecified: Secondary | ICD-10-CM

## 2020-01-23 ENCOUNTER — Other Ambulatory Visit: Payer: Self-pay | Admitting: *Deleted

## 2020-01-23 DIAGNOSIS — Z87891 Personal history of nicotine dependence: Secondary | ICD-10-CM

## 2020-01-23 DIAGNOSIS — Z122 Encounter for screening for malignant neoplasm of respiratory organs: Secondary | ICD-10-CM

## 2020-01-23 NOTE — Progress Notes (Signed)
Current smoker, 36 pack year

## 2020-02-01 ENCOUNTER — Other Ambulatory Visit: Payer: Self-pay

## 2020-02-01 ENCOUNTER — Ambulatory Visit
Admission: RE | Admit: 2020-02-01 | Discharge: 2020-02-01 | Disposition: A | Discharge status: Home / Self Care | Payer: Medicare Other | Source: Ambulatory Visit | Attending: Nurse Practitioner | Admitting: Nurse Practitioner

## 2020-02-01 DIAGNOSIS — Z122 Encounter for screening for malignant neoplasm of respiratory organs: Secondary | ICD-10-CM | POA: Diagnosis not present

## 2020-02-01 DIAGNOSIS — F17210 Nicotine dependence, cigarettes, uncomplicated: Secondary | ICD-10-CM | POA: Diagnosis not present

## 2020-02-01 DIAGNOSIS — Z87891 Personal history of nicotine dependence: Secondary | ICD-10-CM | POA: Insufficient documentation

## 2020-02-04 ENCOUNTER — Encounter: Payer: Self-pay | Admitting: *Deleted

## 2020-02-13 DIAGNOSIS — Z23 Encounter for immunization: Secondary | ICD-10-CM | POA: Diagnosis not present

## 2020-02-27 DIAGNOSIS — R899 Unspecified abnormal finding in specimens from other organs, systems and tissues: Secondary | ICD-10-CM | POA: Insufficient documentation

## 2020-02-27 DIAGNOSIS — L501 Idiopathic urticaria: Secondary | ICD-10-CM | POA: Diagnosis not present

## 2020-03-06 ENCOUNTER — Telehealth: Payer: Self-pay | Admitting: Internal Medicine

## 2020-03-06 ENCOUNTER — Ambulatory Visit (INDEPENDENT_AMBULATORY_CARE_PROVIDER_SITE_OTHER): Payer: Medicare Other | Admitting: Internal Medicine

## 2020-03-06 ENCOUNTER — Encounter: Payer: Self-pay | Admitting: Internal Medicine

## 2020-03-06 ENCOUNTER — Other Ambulatory Visit: Payer: Self-pay

## 2020-03-06 VITALS — BP 106/66 | HR 46 | Temp 97.6°F | Resp 14 | Ht 59.5 in | Wt 93.8 lb

## 2020-03-06 DIAGNOSIS — F419 Anxiety disorder, unspecified: Secondary | ICD-10-CM | POA: Diagnosis not present

## 2020-03-06 DIAGNOSIS — E782 Mixed hyperlipidemia: Secondary | ICD-10-CM | POA: Diagnosis not present

## 2020-03-06 DIAGNOSIS — R911 Solitary pulmonary nodule: Secondary | ICD-10-CM | POA: Diagnosis not present

## 2020-03-06 DIAGNOSIS — F5105 Insomnia due to other mental disorder: Secondary | ICD-10-CM | POA: Diagnosis not present

## 2020-03-06 DIAGNOSIS — M810 Age-related osteoporosis without current pathological fracture: Secondary | ICD-10-CM | POA: Diagnosis not present

## 2020-03-06 DIAGNOSIS — Z720 Tobacco use: Secondary | ICD-10-CM

## 2020-03-06 DIAGNOSIS — L509 Urticaria, unspecified: Secondary | ICD-10-CM | POA: Diagnosis not present

## 2020-03-06 DIAGNOSIS — I2510 Atherosclerotic heart disease of native coronary artery without angina pectoris: Secondary | ICD-10-CM

## 2020-03-06 NOTE — Patient Instructions (Signed)
YOU ARE DOING WELL!    SEE YOU IN 6 MONTHS

## 2020-03-06 NOTE — Progress Notes (Signed)
Subjective:  Patient ID: Kimberly Schmitt, female    DOB: 1951-03-07  Age: 69 y.o. MRN: [id number]  CC: The primary encounter diagnosis was Moderate mixed hyperlipidemia not requiring statin therapy. Diagnoses of Tobacco abuse, Atherosclerosis of native coronary artery of native heart without angina pectoris, Solitary pulmonary nodule, Urticaria, Insomnia secondary to anxiety, and Age-related osteoporosis without current pathological fracture were also pertinent to this visit.  HPI JAQUESHA BOROFF presents for follow up ons chronic issues including tobacco abuse,  Hyperlipidemia, and  GAD   Follow up on recurrent uritcarial rash.  Referred to Rheumatology,  Saw Dr Posey Pronto recently  All labs normal .  No recurrence of rash since adding a daily antihistamine.  No med changes by Posey Pronto  She feels generally well,  Still working part time by choice.  Doesn't like being alone with her grief after losing her husband "Junior."  Still having insomnia managed with lorazepam . . Weight stable.   Lung CT reviewed with patient :  Coronary calcifications noted.  She is asymptomatic and taking statin.  Outpatient Medications Prior to Visit  Medication Sig Dispense Refill  . aspirin 81 MG tablet Take 81 mg by mouth daily.    Marland Kitchen atorvastatin (LIPITOR) 20 MG tablet TAKE 1 TABLET (20 MG TOTAL) BY MOUTH DAILY. 90 tablet 3  . Calcium Carbonate-Vit D-Min (CALCIUM 1200 PO) Take 1 capsule by mouth daily.    . cholecalciferol (VITAMIN D) 1000 units tablet Take 1,000 Units by mouth daily.    Marland Kitchen LORazepam (ATIVAN) 1 MG tablet TAKE 1 TABLET (1 MG TOTAL) BY MOUTH 2 (TWO) TIMES DAILY. AS NEEDED FOR INSOMNIA AND ANXIETY 30 tablet 5  . PARoxetine (PAXIL) 10 MG tablet TAKE 1 TABLET (10 MG TOTAL) BY MOUTH DAILY. AFTER DINNER 90 tablet 2  . Probiotic Product (PROBIOTIC DAILY PO) Take by mouth. (Patient not taking: Reported on 03/06/2020)     No facility-administered medications prior to visit.    Review of  Systems;  Patient denies headache, fevers, malaise, unintentional weight loss, skin rash, eye pain, sinus congestion and sinus pain, sore throat, dysphagia,  hemoptysis , cough, dyspnea, wheezing, chest pain, palpitations, orthopnea, edema, abdominal pain, nausea, melena, diarrhea, constipation, flank pain, dysuria, hematuria, urinary  Frequency, nocturia, numbness, tingling, seizures,  Focal weakness, Loss of consciousness,  Tremor, insomnia, depression, anxiety, and suicidal ideation.      Objective:  BP 106/66 (BP Location: Left Arm, Patient Position: Sitting, Cuff Size: Normal)   Pulse (!) 46   Temp 97.6 F (36.4 C) (Oral)   Resp 14   Ht 4' 11.5" (1.511 m)   Wt 93 lb 12.8 oz (42.5 kg)   SpO2 98%   BMI 18.63 kg/m   BP Readings from Last 3 Encounters:  03/06/20 106/66  01/16/20 122/78  01/13/20 (!) 148/80    Wt Readings from Last 3 Encounters:  03/06/20 93 lb 12.8 oz (42.5 kg)  02/01/20 96 lb (43.5 kg)  01/16/20 94 lb 9.6 oz (42.9 kg)    General appearance: alert, cooperative and appears stated age Ears: normal TM's and external ear canals both ears Throat: lips, mucosa, and tongue normal; teeth and gums normal Neck: no adenopathy, no carotid bruit, supple, symmetrical, trachea midline and thyroid not enlarged, symmetric, no tenderness/mass/nodules Back: symmetric, no curvature. ROM normal. No CVA tenderness. Lungs: clear to auscultation bilaterally Heart: regular rate and rhythm, S1, S2 normal, no murmur, click, rub or gallop Abdomen: soft, non-tender; bowel sounds normal; no masses,  no organomegaly  Pulses: 2+ and symmetric Skin: Skin color, texture, turgor normal. No rashes or lesions Lymph nodes: Cervical, supraclavicular, and axillary nodes normal.  No results found for: HGBA1C  Lab Results  Component Value Date   CREATININE 0.82 01/13/2020   CREATININE 0.90 06/28/2019   CREATININE 0.71 06/22/2018    Lab Results  Component Value Date   WBC 10.3 01/16/2020    HGB 14.2 01/16/2020   HCT 43.0 01/16/2020   PLT 269.0 01/16/2020   GLUCOSE 105 (H) 01/13/2020   CHOL 146 06/28/2019   TRIG 56 06/28/2019   HDL 66 06/28/2019   LDLDIRECT 147.5 04/17/2013   LDLCALC 66 06/28/2019   ALT 18 01/13/2020   AST 25 01/13/2020   NA 139 01/13/2020   K 3.5 01/13/2020   CL 102 01/13/2020   CREATININE 0.82 01/13/2020   BUN 17 01/13/2020   CO2 25 01/13/2020   TSH 1.11 01/16/2020    CT CHEST LUNG CANCER SCREENING LOW DOSE WO CONTRAST  Result Date: 02/02/2020 CLINICAL DATA:  Lung cancer screening. Thirty-six pack-year history. Current asymptomatic smoker. EXAM: CT CHEST WITHOUT CONTRAST LOW-DOSE FOR LUNG CANCER SCREENING TECHNIQUE: Multidetector CT imaging of the chest was performed following the standard protocol without IV contrast. COMPARISON:  01/24/2019 FINDINGS: Cardiovascular: Heart size appears within normal limits. Aortic atherosclerosis. Coronary artery atherosclerotic calcifications. Mediastinum/Nodes: Normal appearance of the thyroid gland. The trachea appears patent and is midline. Normal appearance of the esophagus. No enlarged supraclavicular, axillary or mediastinal lymph nodes. Lungs/Pleura: Moderate centrilobular emphysema and paraseptal emphysema. No pleural effusion, airspace consolidation, or atelectasis. Small calcified and noncalcified lung nodules are again identified and appear unchanged from previous exam. The noncalcified lung nodule is in the perifissural aspect of the left midlung with an equivalent diameter of 3.5 mm. Upper Abdomen: No acute abnormality. Musculoskeletal: No chest wall mass or suspicious bone lesions identified. IMPRESSION: 1. Lung-RADS 2, benign appearance or behavior. Continue annual screening with low-dose chest CT without contrast in 12 months. 2. Coronary artery calcifications noted. Aortic Atherosclerosis (ICD10-I70.0) and Emphysema (ICD10-J43.9). Electronically Signed   By: Kerby Moors M.D.   On: 02/02/2020 08:48     Assessment & Plan:   Problem List Items Addressed This Visit      Unprioritized   Tobacco abuse    Increased use since Junior passed away. Does not want to quit ("I've been smoking for 50 yrs")  She really has no desire to quit despite an acknowledgement of the current and future hazards to patient's  health,  Patient was encouraged to continue her annual lung CT which was done in February 01, 2020       Hyperlipidemia - Primary   Relevant Orders   Comprehensive metabolic panel   Lipid panel   Atherosclerosis of native coronary artery of native heart without angina pectoris    Reviewed findings of prior CT scan today..  Patient is willing to  Continue statin therapy for primary prevention       Solitary pulmonary nodule    No change by serial CTs       Insomnia secondary to anxiety    Resolved with Junior's passing. She has not filled the lorazepam since July  The risks and benefits of benzodiazepine use were discussed with patient today including excessive sedation leading to respiratory depression,  impaired thinking/driving, and addiction.  Patient was advised to avoid concurrent use with alcohol, to use medication only as needed and not to share with others  .  Osteoporosis    Continue  use Prolia.  Discussed the current controversies surrounding the risks and benefits of calcium supplementation.  Encouraged her to increase dietary calcium through natural foods including almond/coconut milk      Relevant Medications   Calcium Carbonate-Vit D-Min (CALCIUM 1200 PO)   Urticaria    Serologic workup normal.  Rheumatology referral complete.  Continue daily antihistamine          I provided  30 minutes of  face-to-face time during this encounter reviewing patient's current problems and past referrals and workup,  labs and imaging studies, providing counseling on the above mentioned problems , and coordination  of care . I have discontinued Neima A. Sugg's  Probiotic Product (PROBIOTIC DAILY PO). I am also having her maintain her aspirin, cholecalciferol, atorvastatin, PARoxetine, LORazepam, and Calcium Carbonate-Vit D-Min (CALCIUM 1200 PO).  No orders of the defined types were placed in this encounter.   Medications Discontinued During This Encounter  Medication Reason  . Probiotic Product (PROBIOTIC DAILY PO)     Follow-up: No follow-ups on file.   Crecencio Mc, MD

## 2020-03-06 NOTE — Telephone Encounter (Signed)
Patient was schedule for prolia inj on 04-09-20 of if this not ok let me know.

## 2020-03-09 NOTE — Assessment & Plan Note (Signed)
Resolved with Junior's passing. She has not filled the lorazepam since July  The risks and benefits of benzodiazepine use were discussed with patient today including excessive sedation leading to respiratory depression,  impaired thinking/driving, and addiction.  Patient was advised to avoid concurrent use with alcohol, to use medication only as needed and not to share with others  .

## 2020-03-09 NOTE — Assessment & Plan Note (Signed)
Serologic workup normal.  Rheumatology referral complete.  Continue daily antihistamine

## 2020-03-09 NOTE — Assessment & Plan Note (Signed)
Reviewed findings of prior CT scan today..  Patient is willing to  Continue statin therapy for primary prevention

## 2020-03-09 NOTE — Assessment & Plan Note (Signed)
Continue  use Prolia.  Discussed the current controversies surrounding the risks and benefits of calcium supplementation.  Encouraged her to increase dietary calcium through natural foods including almond/coconut milk

## 2020-03-09 NOTE — Assessment & Plan Note (Signed)
No change by serial CTs

## 2020-03-09 NOTE — Assessment & Plan Note (Addendum)
Increased use since Junior passed away. Does not want to quit ("I've been smoking for 50 yrs")  She really has no desire to quit despite an acknowledgement of the current and future hazards to patient's  health,  Patient was encouraged to continue her annual lung CT which was done in February 01, 2020

## 2020-04-08 ENCOUNTER — Other Ambulatory Visit: Payer: Self-pay | Admitting: Internal Medicine

## 2020-04-09 ENCOUNTER — Ambulatory Visit (INDEPENDENT_AMBULATORY_CARE_PROVIDER_SITE_OTHER): Payer: Medicare Other

## 2020-04-09 ENCOUNTER — Other Ambulatory Visit: Payer: Self-pay

## 2020-04-09 DIAGNOSIS — M810 Age-related osteoporosis without current pathological fracture: Secondary | ICD-10-CM | POA: Diagnosis not present

## 2020-04-09 MED ORDER — DENOSUMAB 60 MG/ML ~~LOC~~ SOSY
60.0000 mg | PREFILLED_SYRINGE | Freq: Once | SUBCUTANEOUS | Status: AC
  Administered 2020-04-09: 60 mg via SUBCUTANEOUS

## 2020-04-09 NOTE — Progress Notes (Signed)
Patient presented for 6-month Prolia injection SQ to left arm. Patient tolerated well. 

## 2020-04-16 ENCOUNTER — Other Ambulatory Visit: Payer: Self-pay | Admitting: Internal Medicine

## 2020-04-18 ENCOUNTER — Other Ambulatory Visit: Payer: Self-pay | Admitting: Internal Medicine

## 2020-06-27 DIAGNOSIS — L501 Idiopathic urticaria: Secondary | ICD-10-CM | POA: Diagnosis not present

## 2020-06-27 DIAGNOSIS — R899 Unspecified abnormal finding in specimens from other organs, systems and tissues: Secondary | ICD-10-CM | POA: Diagnosis not present

## 2020-07-01 ENCOUNTER — Other Ambulatory Visit: Payer: Self-pay | Admitting: Internal Medicine

## 2020-07-01 DIAGNOSIS — Z1231 Encounter for screening mammogram for malignant neoplasm of breast: Secondary | ICD-10-CM

## 2020-07-11 DIAGNOSIS — H2513 Age-related nuclear cataract, bilateral: Secondary | ICD-10-CM | POA: Diagnosis not present

## 2020-07-26 DIAGNOSIS — D2261 Melanocytic nevi of right upper limb, including shoulder: Secondary | ICD-10-CM | POA: Diagnosis not present

## 2020-07-26 DIAGNOSIS — D2262 Melanocytic nevi of left upper limb, including shoulder: Secondary | ICD-10-CM | POA: Diagnosis not present

## 2020-07-26 DIAGNOSIS — D225 Melanocytic nevi of trunk: Secondary | ICD-10-CM | POA: Diagnosis not present

## 2020-07-26 DIAGNOSIS — D2271 Melanocytic nevi of right lower limb, including hip: Secondary | ICD-10-CM | POA: Diagnosis not present

## 2020-07-26 DIAGNOSIS — L821 Other seborrheic keratosis: Secondary | ICD-10-CM | POA: Diagnosis not present

## 2020-07-26 DIAGNOSIS — D2272 Melanocytic nevi of left lower limb, including hip: Secondary | ICD-10-CM | POA: Diagnosis not present

## 2020-07-30 ENCOUNTER — Other Ambulatory Visit: Payer: Self-pay

## 2020-07-30 ENCOUNTER — Ambulatory Visit
Admission: RE | Admit: 2020-07-30 | Discharge: 2020-07-30 | Disposition: A | Discharge status: Home / Self Care | Payer: Medicare Other | Source: Ambulatory Visit | Attending: Internal Medicine | Admitting: Internal Medicine

## 2020-07-30 DIAGNOSIS — Z1231 Encounter for screening mammogram for malignant neoplasm of breast: Secondary | ICD-10-CM | POA: Insufficient documentation

## 2020-08-05 ENCOUNTER — Other Ambulatory Visit: Payer: Self-pay

## 2020-08-05 ENCOUNTER — Other Ambulatory Visit (INDEPENDENT_AMBULATORY_CARE_PROVIDER_SITE_OTHER): Payer: Medicare Other

## 2020-08-05 DIAGNOSIS — E782 Mixed hyperlipidemia: Secondary | ICD-10-CM | POA: Diagnosis not present

## 2020-08-05 LAB — COMPREHENSIVE METABOLIC PANEL
ALT: 15 U/L (ref 0–35)
AST: 19 U/L (ref 0–37)
Albumin: 4.6 g/dL (ref 3.5–5.2)
Alkaline Phosphatase: 41 U/L (ref 39–117)
BUN: 21 mg/dL (ref 6–23)
CO2: 26 mEq/L (ref 19–32)
Calcium: 9.3 mg/dL (ref 8.4–10.5)
Chloride: 106 mEq/L (ref 96–112)
Creatinine, Ser: 0.83 mg/dL (ref 0.40–1.20)
GFR: 70.87 mL/min (ref >60.00)
Glucose, Bld: 92 mg/dL (ref 70–99)
Potassium: 4.4 mEq/L (ref 3.5–5.1)
Sodium: 140 mEq/L (ref 135–145)
Total Bilirubin: 0.6 mg/dL (ref 0.2–1.2)
Total Protein: 6.8 g/dL (ref 6.0–8.3)

## 2020-08-05 LAB — LIPID PANEL
Cholesterol: 160 mg/dL (ref 0–200)
HDL: 68.3 mg/dL (ref >39.00)
LDL Cholesterol: 74 mg/dL (ref 0–99)
NonHDL: 92.14
Total CHOL/HDL Ratio: 2
Triglycerides: 91 mg/dL (ref 0.0–149.0)
VLDL: 18.2 mg/dL (ref 0.0–40.0)

## 2020-08-07 ENCOUNTER — Telehealth: Payer: Self-pay | Admitting: Internal Medicine

## 2020-08-07 DIAGNOSIS — Z1211 Encounter for screening for malignant neoplasm of colon: Secondary | ICD-10-CM

## 2020-08-07 NOTE — Telephone Encounter (Signed)
Your cholesterol, liver and kidney function are normal.  You do not need any medication changes. Please plan to repeat the labs in 6 months.    Regards,   Dr. Derrel Nip

## 2020-08-08 NOTE — Telephone Encounter (Signed)
LMTCB

## 2020-08-08 NOTE — Telephone Encounter (Signed)
Spoke with pt and informed her of her lab results. Pt gave a verbal understanding. Pt stated that she is due for her colonoscopy and would like to get a referral to have that done. She would like to see Dr. Bary Castilla at [place].

## 2020-09-03 ENCOUNTER — Telehealth: Payer: Self-pay | Admitting: Internal Medicine

## 2020-09-03 NOTE — Telephone Encounter (Signed)
Called to schedule Prolia Injection no answer left voicemail please schedule Prolia on or after 10/07/20.

## 2020-09-10 ENCOUNTER — Other Ambulatory Visit: Payer: Self-pay | Admitting: General Surgery

## 2020-09-10 DIAGNOSIS — Z800 Family history of malignant neoplasm of digestive organs: Secondary | ICD-10-CM | POA: Diagnosis not present

## 2020-09-10 DIAGNOSIS — Z86010 Personal history of colonic polyps: Secondary | ICD-10-CM | POA: Diagnosis not present

## 2020-09-10 NOTE — Progress Notes (Signed)
Subjective:     Patient ID: Kimberly Schmitt is a 70 y.o. female.  HPI  The following portions of the patient's history were reviewed and updated as appropriate.  This an established patient is here today for: office visit. The patient is here today to discuss a colonoscopy. She has been referred by Dr. Derrel Nip. Her last colonoscopy was completed by Dr. Vira Agar in 2017. She does report a history of colon cancer in her father. Patient denies any rectal bleeding or mucus. The patient reports she moves her bowels every morning. She states she has 4 dried apricots with her coffee before work.        Chief Complaint  Patient presents with  . Colonoscopy    discussion     BP 132/68   Pulse 108   Temp 36.4 C (97.5 F)   Ht 151.1 cm (4' 11.5")   Wt (!) 44 kg (97 lb)   SpO2 100%   BMI 19.26 kg/m       Past Medical History:  Diagnosis Date  . Hyperlipidemia   . Osteoporosis           Past Surgical History:  Procedure Laterality Date  . APPENDECTOMY  1968  . COLONOSCOPY  08/24/2000   [facility] (Father), FH Colon Polyps (Sister/Brothers)  . COLONOSCOPY  03/05/2006   Adenomatous Polyp, [facility] (Father), FH Colon Polyps (Sister/Brothers)  . COLONOSCOPY  03/13/2011   PH Adenomatous Polyp, [facility] (Father), FH Colon Polyps (Sister/Brothers): [facility] 03/2016; Recall Ltr mailed 01/16/2016 (dw)  . COLONOSCOPY  03/26/2016   Dr Elliott/Adenomatous Polyps, [facility] (Father), FH Colon Polyps (Sister/Brother): [facility] 03/2021  . excision sebaceous cyst  2021              OB History    Gravida  1   Para  1   Term      Preterm      AB      Living        SAB      IAB      Ectopic      Molar      Multiple      Live Births          Obstetric Comments  Age at first period 24 Age of first pregnancy 3 LMP age 68        Social History          Socioeconomic History  . Marital status: Widowed  Occupational History  . Occupation: works at [employer]  . Smoking status: Current Every Day Smoker    Packs/day: 0.50    Years: 45.00    Pack years: 22.50    Types: Cigarettes  . Smokeless tobacco: Never Used  Vaping Use  . Vaping Use: Never used  Substance and Sexual Activity  . Alcohol use: Never  . Drug use: Never       No Known Allergies  Current Medications  Current Outpatient Medications  Medication Sig Dispense Refill  . aspirin 81 MG EC tablet Take 81 mg by mouth once daily    . atorvastatin (LIPITOR) 20 MG tablet TAKE 1 TABLET (20 MG TOTAL) BY MOUTH DAILY.  1  . CALCIUM CARB-MAG OXIDE-ZINC OX ORAL Take by mouth once daily    . cholecalciferol (VITAMIN D3) 2,000 unit tablet Take 4,000 Units by mouth once daily       . diphenhydramine HCl (ALLERGY ORAL) Take 1 tablet by mouth once daily    .  docusate sodium (STOOL SOFTENER ORAL) Take by mouth once daily    . Lactobacillus acidophilus (PROBIOTIC ORAL) Take by mouth once daily    . LORazepam (ATIVAN) 1 MG tablet Take 1 tablet by mouth 2 (two) times daily as needed for Sleep or Anxiety    . PARoxetine (PAXIL) 10 MG tablet Take 10 mg by mouth once daily    . polyethylene glycol (MIRALAX) powder One bottle for colonoscopy prep. Use as directed. 255 g 0   No current facility-administered medications for this visit.           Family History  Problem Relation Age of Onset  . Colon cancer Father   . Diabetes Sister   . Pancreatic cancer Brother   . Colon polyps Brother   . Stroke Maternal Aunt   . Breast cancer Maternal Grandmother        Review of Systems  Constitutional: Negative for chills and fever.  Respiratory: Negative for cough.        Objective:   Physical Exam Exam conducted with a chaperone present.  Constitutional:      Appearance: Normal appearance.  Cardiovascular:     Rate and Rhythm: Normal rate and regular rhythm.     Pulses: Normal pulses.     Heart sounds: Normal heart sounds.   Pulmonary:     Effort: Pulmonary effort is normal.     Breath sounds: Normal breath sounds.    Musculoskeletal:     Cervical back: Neck supple.  Skin:    General: Skin is warm and dry.  Neurological:     Mental Status: She is alert and oriented to person, place, and time.  Psychiatric:        Mood and Affect: Mood normal.        Behavior: Behavior normal.    Labs and Radiology:   Pathology from November 2017 colonoscopy showed a tubular adenoma at the hepatic flexure as well as the cecum.  No atypia.  August 05, 2020 comprehensive metabolic panel:  Sodium 235 - 145 mEq/L 140   Potassium 3.5 - 5.1 mEq/L 4.4   Chloride 96 - 112 mEq/L 106   CO2 19 - 32 mEq/L 26   Glucose, Bld 70 - 99 mg/dL 92   BUN 6 - 23 mg/dL 21   Creatinine, Ser 0.40 - 1.20 mg/dL 0.83   Total Bilirubin 0.2 - 1.2 mg/dL 0.6   Alkaline Phosphatase 39 - 117 U/L 41   AST 0 - 37 U/L 19   ALT 0 - 35 U/L 15   Total Protein 6.0 - 8.3 g/dL 6.8   Albumin 3.5 - 5.2 g/dL 4.6   GFR >60.00 mL/min 70.87   Comment: Calculated using the CKD-EPI Creatinine Equation (2021)   January 16, 2020 CBC:  WBC 4.0 - 10.5 K/uL 10.3   RBC 3.87 - 5.11 Mil/uL 4.60   Hemoglobin 12.0 - 15.0 g/dL 14.2   HCT 36.0 - 46.0 % 43.0   MCV 78.0 - 100.0 fl 93.3   MCHC 30.0 - 36.0 g/dL 33.0   RDW 11.5 - 15.5 % 14.4   Platelets 150.0 - 400.0 K/uL 269.0   Neutrophils Relative % 43.0 - 77.0 % 61.9   Lymphocytes Relative 12.0 - 46.0 % 30.2   Monocytes Relative 3.0 - 12.0 % 6.6   Eosinophils Relative 0.0 - 5.0 % 0.5   Basophils Relative 0.0 - 3.0 % 0.8   Neutro Abs 1.4 - 7.7 K/uL 6.4   Lymphs Abs 0.7 -  4.0 K/uL 3.1   Monocytes Absolute 0.1 - 1.0 K/uL 0.7   Eosinophils Absolute 0.0 - 0.7 K/uL 0.0   Basophils Absolute 0.0 - 0.1 K/uL 0.1         Assessment:     Candidate for follow-up colonoscopy based on personal and family history.  Candidate for referral to genetic counseling with family history of colon and pancreatic  cancer as well as 2 family members with colonic polyps.    Plan:     The patient was amenable to a referral to [facility] for genetic counseling.  Instructed in regards to colonoscopy by the staff.    This note is partially prepared by Ledell Noss, CMA acting as a scribe in the presence of Dr. Hervey Ard, MD.   The documentation recorded by the scribe accurately reflects the service I personally performed and the decisions made by me.   Robert Bellow, MD FACS

## 2020-09-19 ENCOUNTER — Encounter: Payer: Self-pay | Admitting: General Surgery

## 2020-09-20 ENCOUNTER — Encounter
Admission: RE | Disposition: A | Discharge status: Home / Self Care | Payer: Self-pay | Source: Home / Self Care | Attending: General Surgery

## 2020-09-20 ENCOUNTER — Ambulatory Visit
Admission: RE | Admit: 2020-09-20 | Discharge: 2020-09-20 | Disposition: A | Discharge status: Home / Self Care | Payer: Medicare Other | Attending: General Surgery | Admitting: General Surgery

## 2020-09-20 ENCOUNTER — Ambulatory Visit: Payer: Medicare Other | Admitting: Certified Registered Nurse Anesthetist

## 2020-09-20 ENCOUNTER — Encounter: Payer: Self-pay | Admitting: General Surgery

## 2020-09-20 DIAGNOSIS — K635 Polyp of colon: Secondary | ICD-10-CM | POA: Diagnosis not present

## 2020-09-20 DIAGNOSIS — Z7982 Long term (current) use of aspirin: Secondary | ICD-10-CM | POA: Insufficient documentation

## 2020-09-20 DIAGNOSIS — K8000 Calculus of gallbladder with acute cholecystitis without obstruction: Secondary | ICD-10-CM | POA: Diagnosis not present

## 2020-09-20 DIAGNOSIS — Z8719 Personal history of other diseases of the digestive system: Secondary | ICD-10-CM | POA: Diagnosis not present

## 2020-09-20 DIAGNOSIS — Z79899 Other long term (current) drug therapy: Secondary | ICD-10-CM | POA: Insufficient documentation

## 2020-09-20 DIAGNOSIS — F17210 Nicotine dependence, cigarettes, uncomplicated: Secondary | ICD-10-CM | POA: Diagnosis not present

## 2020-09-20 DIAGNOSIS — Z1211 Encounter for screening for malignant neoplasm of colon: Secondary | ICD-10-CM | POA: Diagnosis not present

## 2020-09-20 DIAGNOSIS — K621 Rectal polyp: Secondary | ICD-10-CM | POA: Diagnosis not present

## 2020-09-20 DIAGNOSIS — Z800 Family history of malignant neoplasm of digestive organs: Secondary | ICD-10-CM | POA: Insufficient documentation

## 2020-09-20 DIAGNOSIS — K5730 Diverticulosis of large intestine without perforation or abscess without bleeding: Secondary | ICD-10-CM | POA: Insufficient documentation

## 2020-09-20 DIAGNOSIS — Z86010 Personal history of colonic polyps: Secondary | ICD-10-CM | POA: Diagnosis not present

## 2020-09-20 HISTORY — PX: COLONOSCOPY WITH PROPOFOL: SHX5780

## 2020-09-20 LAB — HM COLONOSCOPY

## 2020-09-20 SURGERY — COLONOSCOPY WITH PROPOFOL
Anesthesia: General

## 2020-09-20 MED ORDER — PROPOFOL 500 MG/50ML IV EMUL
INTRAVENOUS | Status: DC | PRN
  Administered 2020-09-20: 150 ug/kg/min via INTRAVENOUS

## 2020-09-20 MED ORDER — LIDOCAINE HCL (PF) 2 % IJ SOLN
INTRAMUSCULAR | Status: AC
  Filled 2020-09-20: qty 2

## 2020-09-20 MED ORDER — PROPOFOL 500 MG/50ML IV EMUL
INTRAVENOUS | Status: AC
  Filled 2020-09-20: qty 50

## 2020-09-20 MED ORDER — LIDOCAINE HCL (CARDIAC) PF 100 MG/5ML IV SOSY
PREFILLED_SYRINGE | INTRAVENOUS | Status: DC | PRN
  Administered 2020-09-20: 40 mg via INTRAVENOUS

## 2020-09-20 MED ORDER — PROPOFOL 10 MG/ML IV BOLUS
INTRAVENOUS | Status: DC | PRN
  Administered 2020-09-20: 80 mg via INTRAVENOUS

## 2020-09-20 MED ORDER — SODIUM CHLORIDE 0.9 % IV SOLN
INTRAVENOUS | Status: DC
  Administered 2020-09-20: 20 mL/h via INTRAVENOUS

## 2020-09-20 NOTE — H&P (Signed)
Kimberly Schmitt [id number] 03/31/1951     HPI: 70 y/o woman with a family history of colon cancer in her father while he was in the 88"s.  For high risk screening.   Medications Prior to Admission  Medication Sig Dispense Refill Last Dose  . aspirin 81 MG tablet Take 81 mg by mouth daily.   Past Week at Unknown time  . atorvastatin (LIPITOR) 20 MG tablet TAKE 1 TABLET (20 MG TOTAL) BY MOUTH DAILY. 90 tablet 3 09/19/2020 at Unknown time  . Calcium Carbonate-Vit D-Min (CALCIUM 1200 PO) Take 1 capsule by mouth daily.   Past Week at Unknown time  . cholecalciferol (VITAMIN D) 1000 units tablet Take 1,000 Units by mouth daily.   Past Week at Unknown time  . LORazepam (ATIVAN) 1 MG tablet TAKE 1 TABLET (1 MG TOTAL) BY MOUTH 2 (TWO) TIMES DAILY. AS NEEDED FOR INSOMNIA AND ANXIETY 30 tablet 5 Past Week at Unknown time  . PARoxetine (PAXIL) 10 MG tablet TAKE 1 TABLET (10 MG TOTAL) BY MOUTH DAILY. AFTER DINNER 90 tablet 2 Past Week at Unknown time   No Known Allergies Past Medical History:  Diagnosis Date  . Hyperlipidemia    220 per patient  . Personal history of tobacco use, presenting hazards to health 06/12/2015  . Visit for preventive health examination 04/17/2013   .    Past Surgical History:  Procedure Laterality Date  . APPENDECTOMY  1968  . COLONOSCOPY    . COLONOSCOPY     Social History   Socioeconomic History  . Marital status: Married    Spouse name: Not on file  . Number of children: Not on file  . Years of education: Not on file  . Highest education level: Not on file  Occupational History  . Not on file  Tobacco Use  . Smoking status: Current Every Day Smoker    Packs/day: 0.50    Years: 45.00    Pack years: 22.50    Types: Cigarettes  . Smokeless tobacco: Never Used  Substance and Sexual Activity  . Alcohol use: No    Alcohol/week: 0.0 standard drinks  . Drug use: No  . Sexual activity: Not on file  Other Topics Concern  . Not on file  Social History Narrative   . Not on file   Social Determinants of Health   Financial Resource Strain: Low Risk   . Difficulty of Paying Living Expenses: Not hard at all  Food Insecurity: Not on file  Transportation Needs: No Transportation Needs  . Lack of Transportation (Medical): No  . Lack of Transportation (Non-Medical): No  Physical Activity: Sufficiently Active  . Days of Exercise per Week: 5 days  . Minutes of Exercise per Session: 30 min  Stress: Not on file  Social Connections: Not on file  Intimate Partner Violence: Not on file   Social History   Social History Narrative  . Not on file     ROS: Negative.     PE: HEENT: Negative. Lungs: Clear. Cardio: RR.   Assessment/Plan:  Proceed with planned endoscopy.  Forest Gleason [facility] 09/20/2020

## 2020-09-20 NOTE — Anesthesia Preprocedure Evaluation (Signed)
Anesthesia Evaluation  Patient identified by MRN, date of birth, ID band Patient awake    Reviewed: Allergy & Precautions, NPO status , Patient's Chart, lab work & pertinent test results  Airway Mallampati: II  TM Distance: >3 FB Neck ROM: Full    Dental  (+) Chipped,    Pulmonary neg pulmonary ROS, Current Smoker and Patient abstained from smoking.,    Pulmonary exam normal        Cardiovascular + CAD  Normal cardiovascular exam     Neuro/Psych PSYCHIATRIC DISORDERS Anxiety negative neurological ROS     GI/Hepatic negative GI ROS, Neg liver ROS,   Endo/Other  negative endocrine ROS  Renal/GU negative Renal ROS  negative genitourinary   Musculoskeletal negative musculoskeletal ROS (+)   Abdominal   Peds negative pediatric ROS (+)  Hematology negative hematology ROS (+)   Anesthesia Other Findings Moderate mixed hyperlipidemia not requiring statin therapy  Tobacco abuse  Atherosclerosis of native coronary artery of native heart without angina pectoris  Solitary pulmonary nodule  Urticaria  Insomnia secondary to anxiety  Age-related osteoporosis without current pathological fracture    Reproductive/Obstetrics negative OB ROS                            Anesthesia Physical Anesthesia Plan  ASA: II  Anesthesia Plan: General   Post-op Pain Management:    Induction: Intravenous  PONV Risk Score and Plan: 2 and Propofol infusion and TIVA  Airway Management Planned: Natural Airway and Nasal Cannula  Additional Equipment:   Intra-op Plan:   Post-operative Plan:   Informed Consent: I have reviewed the patients History and Physical, chart, labs and discussed the procedure including the risks, benefits and alternatives for the proposed anesthesia with the patient or authorized representative who has indicated his/her understanding and acceptance.       Plan Discussed  with: CRNA, Anesthesiologist and Surgeon  Anesthesia Plan Comments:         Anesthesia Quick Evaluation

## 2020-09-20 NOTE — Anesthesia Procedure Notes (Signed)
Date/Time: 09/20/2020 8:13 AM Performed by: Johnna Acosta, CRNA Pre-anesthesia Checklist: Patient identified, Emergency Drugs available, Suction available, Patient being monitored and Timeout performed Patient Re-evaluated:Patient Re-evaluated prior to induction Oxygen Delivery Method: Nasal cannula Preoxygenation: Pre-oxygenation with 100% oxygen Induction Type: IV induction

## 2020-09-20 NOTE — Anesthesia Postprocedure Evaluation (Signed)
Anesthesia Post Note  Patient: Kimberly Schmitt  Procedure(s) Performed: COLONOSCOPY WITH PROPOFOL (N/A )  Patient location during evaluation: Phase II Anesthesia Type: General Level of consciousness: awake and alert, awake and oriented Pain management: pain level controlled Vital Signs Assessment: post-procedure vital signs reviewed and stable Respiratory status: spontaneous breathing, nonlabored ventilation and respiratory function stable Cardiovascular status: blood pressure returned to baseline and stable Postop Assessment: no apparent nausea or vomiting Anesthetic complications: no   No complications documented.   Last Vitals:  Vitals:   09/20/20 0856 09/20/20 0907  BP: 119/67 (!) 142/71  Pulse: 72 69  Resp: (!) 23 16  Temp:    SpO2: 100% 100%    Last Pain:  Vitals:   09/20/20 0847  TempSrc: Temporal  PainSc:                  Phill Mutter

## 2020-09-20 NOTE — Op Note (Addendum)
[facility] Patient Name: Kimberly Schmitt Procedure Date: 09/20/2020 8:04 AM MRN: [id number] Account #: [id number] Date of Birth: 1951-04-19 Admit Type: Outpatient Age: 70 Room: [facility] ENDO ROOM 1 Gender: Female Note Status: Supervisor Override Procedure:             Colonoscopy Indications:           Screening for colorectal malignant neoplasm, Screening                         patient at increased risk: Family history of                         1st-degree relative with colorectal cancer at age 66                         years (or older), Personal history of colonic polyps Providers:             Robert Bellow, MD Referring MD:          Deborra Medina, MD (Referring MD) Medicines:             Propofol per Anesthesia Complications:         No immediate complications. Procedure:             Pre-Anesthesia Assessment:                        - Prior to the procedure, a History and Physical was                         performed, and patient medications, allergies and                         sensitivities were reviewed. The patient's tolerance                         of previous anesthesia was reviewed.                        - The risks and benefits of the procedure and the                         sedation options and risks were discussed with the                         patient. All questions were answered and informed                         consent was obtained.                        After obtaining informed consent, the colonoscope was                         passed under direct vision. Throughout the procedure,                         the patient's blood pressure, pulse, and oxygen  saturations were monitored continuously. The                         Colonoscope was introduced through the anus and                         advanced to the the cecum, identified by appendiceal                         orifice and ileocecal  valve. The colonoscopy was                         performed without difficulty. The patient tolerated                         the procedure well. The quality of the bowel                         preparation was excellent. Findings:      A few medium-mouthed diverticula were found in the recto-sigmoid colon       and sigmoid colon.      A 5 mm polyp was found in the rectum. The polyp was sessile. The polyp       was removed with a cold snare. Resection and retrieval were complete.      The retroflexed view of the distal rectum and anal verge was normal and       showed no anal or rectal abnormalities. Impression:            - Diverticulosis in the recto-sigmoid colon and in the                         sigmoid colon.                        - One 5 mm polyp in the rectum, removed with a cold                         snare. Resected and retrieved.                        - The distal rectum and anal verge are normal on                         retroflexion view. Recommendation:        - Telephone endoscopist for pathology results in 1                         week.                        - Repeat colonoscopy in 5 years for screening purposes. Procedure Code(s):     --- Professional ---                        [phone], Colonoscopy, flexible; with removal of                         tumor(s), polyp(s), or other lesion(s) by snare  technique Diagnosis Code(s):     --- Professional ---                        Z80.0, Family history of malignant neoplasm of                         digestive organs                        K62.1, Rectal polyp                        K57.30, Diverticulosis of large intestine without                         perforation or abscess without bleeding CPT copyright 2019 American Medical Association. All rights reserved. The codes documented in this report are preliminary and upon coder review may  be revised to meet current compliance requirements. Robert Bellow, MD 09/20/2020 8:44:19 AM This report has been signed electronically. Number of Addenda: 0 Note Initiated On: 09/20/2020 8:04 AM Scope Withdrawal Time: 0 hours 16 minutes 31 seconds  Total Procedure Duration: 0 hours 24 minutes 50 seconds  Estimated Blood Loss:  Estimated blood loss: none. Estimated blood loss was                         minimal.      [facility]

## 2020-09-20 NOTE — Transfer of Care (Signed)
Immediate Anesthesia Transfer of Care Note  Patient: Kimberly Schmitt  Procedure(s) Performed: COLONOSCOPY WITH PROPOFOL (N/A )  Patient Location: PACU  Anesthesia Type:General  Level of Consciousness: awake, alert  and oriented  Airway & Oxygen Therapy: Patient Spontanous Breathing  Post-op Assessment: Report given to RN and Post -op Vital signs reviewed and stable  Post vital signs: Reviewed and stable  Last Vitals:  Vitals Value Taken Time  BP 101/58 09/20/20 0847  Temp    Pulse 77 09/20/20 0847  Resp 20 09/20/20 0847  SpO2 100 % 09/20/20 0847    Last Pain:  Vitals:   09/20/20 0742  TempSrc: Temporal  PainSc: 0-No pain         Complications: No complications documented.

## 2020-09-23 ENCOUNTER — Encounter: Payer: Self-pay | Admitting: General Surgery

## 2020-09-23 LAB — SURGICAL PATHOLOGY

## 2020-09-24 ENCOUNTER — Encounter: Payer: Self-pay | Admitting: Licensed Clinical Social Worker

## 2020-09-24 ENCOUNTER — Inpatient Hospital Stay: Payer: Medicare Other | Attending: Oncology | Admitting: Licensed Clinical Social Worker

## 2020-09-24 ENCOUNTER — Inpatient Hospital Stay: Payer: Medicare Other

## 2020-09-24 ENCOUNTER — Encounter (INDEPENDENT_AMBULATORY_CARE_PROVIDER_SITE_OTHER): Payer: Self-pay

## 2020-09-24 DIAGNOSIS — Z803 Family history of malignant neoplasm of breast: Secondary | ICD-10-CM

## 2020-09-24 DIAGNOSIS — Z800 Family history of malignant neoplasm of digestive organs: Secondary | ICD-10-CM | POA: Diagnosis not present

## 2020-09-24 NOTE — Progress Notes (Signed)
REFERRING PROVIDER: Robert Bellow, MD [place],  [place] [id number]  PRIMARY PROVIDER:  Crecencio Mc, MD  PRIMARY REASON FOR VISIT:  1. Family history of pancreatic cancer   2. Family history of colon cancer   3. Family history of breast cancer      HISTORY OF PRESENT ILLNESS:   [name], a 70 y.o. female, was seen for a [place] cancer genetics consultation at the request of Dr. Bary Castilla due to a family history of cancer.  [name] presents to clinic today to discuss the possibility of a hereditary predisposition to cancer, genetic testing, and to further clarify her future cancer risks, as well as potential cancer risks for family members.    [name] is a 70 y.o. female with no personal history of cancer.    CANCER HISTORY:  Oncology History   No history exists.     RISK FACTORS:  Menarche was at age 68.  First live birth at age 12.  Ovaries intact: yes.  Hysterectomy: no.  Menopausal status: postmenopausal.  Colonoscopy: yes; reports 2 polyps. Mammogram within the last year: yes.   Past Medical History:  Diagnosis Date  . Family history of breast cancer   . Family history of colon cancer   . Family history of pancreatic cancer   . Hyperlipidemia    220 per patient  . Personal history of tobacco use, presenting hazards to health 06/12/2015  . Visit for preventive health examination 04/17/2013   .     Past Surgical History:  Procedure Laterality Date  . APPENDECTOMY  1968  . COLONOSCOPY    . COLONOSCOPY    . COLONOSCOPY WITH PROPOFOL N/A 09/20/2020   Procedure: COLONOSCOPY WITH PROPOFOL;  Surgeon: Robert Bellow, MD;  Location: [facility] ENDOSCOPY;  Service: Endoscopy;  Laterality: N/A;    Social History   Socioeconomic History  . Marital status: Married    Spouse name: Not on file  . Number of children: Not on file  . Years of education: Not on file  . Highest education level: Not on file  Occupational History  . Not on file   Tobacco Use  . Smoking status: Current Every Day Smoker    Packs/day: 0.50    Years: 45.00    Pack years: 22.50    Types: Cigarettes  . Smokeless tobacco: Never Used  Substance and Sexual Activity  . Alcohol use: No    Alcohol/week: 0.0 standard drinks  . Drug use: No  . Sexual activity: Not on file  Other Topics Concern  . Not on file  Social History Narrative  . Not on file   Social Determinants of Health   Financial Resource Strain: Low Risk   . Difficulty of Paying Living Expenses: Not hard at all  Food Insecurity: Not on file  Transportation Needs: No Transportation Needs  . Lack of Transportation (Medical): No  . Lack of Transportation (Non-Medical): No  Physical Activity: Sufficiently Active  . Days of Exercise per Week: 5 days  . Minutes of Exercise per Session: 30 min  Stress: Not on file  Social Connections: Not on file     FAMILY HISTORY:  We obtained a detailed, 4-generation family history.  Significant diagnoses are listed below: Family History  Problem Relation Age of Onset  . Cancer Brother 74       pancreatic  . Breast cancer Maternal Grandmother 41  . Cancer Father 5       colon  .  Diabetes Sister   . Stroke Maternal Aunt   . Cancer Paternal Uncle        unk type  . Colon polyps Brother    [name] has 1 son and 1 grandson, no cancers. She had 3 brothers, 1 sister, and 1 maternal half sister. Her brother had pancreatic cancer at 83 and died at 8. Another brother has had over 10 colon polyps.  [name] mother is living at 85, as are her two maternal aunts. Her maternal uncle passed away young in an accident. No known cancers in maternal cousins. Maternal grandmother had breast cancer at 62 and died at 15, grandfather died at 72 with no cancer history.  [name] biological father (he did not raise her) had colon cancer and died at 26. Patient had 2 paternal aunts and 2 paternal uncles. One uncle had cancer, unknown type. No other known  cancers on this side of the family, but limited information.   [name] is unaware of previous family history of genetic testing for hereditary cancer risks. Patient's maternal ancestors are of unknown descent, and paternal ancestors are of unknown descent. There is no reported Ashkenazi Jewish ancestry. There is no known consanguinity.    GENETIC COUNSELING ASSESSMENT: [name] is a 70 y.o. female with a family history of pancreatic cancer which is somewhat suggestive of a hereditary cancer syndrome and predisposition to cancer. We, therefore, discussed and recommended the following at today's visit.   DISCUSSION: We discussed that approximately 5-10% of pancreatic cancer is hereditary  Most cases of hereditary pancreatic cancer are associated with BRCA1/BRCA2 genes, although there are other genes associated with hereditary pancreatic cancer as well including Lynch syndrome which also increases the risk for colon cancer and other cancers. There are other genes that we can test, cancers and risks are gene specific. We discussed that testing is beneficial for several reasons including  knowing about cancer risks, identifying potential screening and risk-reduction options that may be appropriate, and to understand if other family members could be at risk for cancer and allow them to undergo genetic testing.   We reviewed the characteristics, features and inheritance patterns of hereditary cancer syndromes. We also discussed genetic testing, including the appropriate family members to test, the process of testing, insurance coverage and turn-around-time for results. We discussed the implications of a negative, positive and/or variant of uncertain significant result. We recommended [name] pursue genetic testing for the [facility] Multi-Cancer+RNA gene panel.   The Multi-Cancer Panel + RNA offered by [facility] includes sequencing and/or deletion duplication testing of the following 84 genes: AIP, ALK, APC,  ATM, AXIN2,BAP1,  BARD1, BLM, BMPR1A, BRCA1, BRCA2, BRIP1, CASR, CDC73, CDH1, CDK4, CDKN1B, CDKN1C, CDKN2A (p14ARF), CDKN2A (p16INK4a), CEBPA, CHEK2, CTNNA1, DICER1, DIS3L2, EGFR (c.2369C>T, p.Thr790Met variant only), EPCAM (Deletion/duplication testing only), FH, FLCN, GATA2, GPC3, GREM1 (Promoter region deletion/duplication testing only), HOXB13 (c.251G>A, p.Gly84Glu), HRAS, KIT, MAX, MEN1, MET, MITF (c.952G>A, p.Glu318Lys variant only), MLH1, MSH2, MSH3, MSH6, MUTYH, NBN, NF1, NF2, NTHL1, PALB2, PDGFRA, PHOX2B, PMS2, POLD1, POLE, POT1, PRKAR1A, PTCH1, PTEN, RAD50, RAD51C, RAD51D, RB1, RECQL4, RET, RUNX1, SDHAF2, SDHA (sequence changes only), SDHB, SDHC, SDHD, SMAD4, SMARCA4, SMARCB1, SMARCE1, STK11, SUFU, TERC, TERT, TMEM127, TP53, TSC1, TSC2, VHL, WRN and WT1.  Based on Ms. Douglass's family history of cancer, she meets medical criteria for genetic testing. Despite that she meets criteria, she may still have an out of pocket cost. We discussed that if her out of pocket cost for testing is over $100, the  laboratory will call and confirm whether she wants to proceed with testing.  If the out of pocket cost of testing is less than $100 she will be billed by the genetic testing laboratory.   PLAN: After considering the risks, benefits, and limitations, Ms. Casad provided informed consent to pursue genetic testing and the blood sample was sent to [facility] for analysis of the Multi-Cancer+RNA panel. Results should be available within approximately 2-3 weeks' time, at which point they will be disclosed by telephone to Ms. Wooding, as will any additional recommendations warranted by these results. Ms. Panek will receive a summary of her genetic counseling visit and a copy of her results once available. This information will also be available in Epic.   Ms. Achey questions were answered to her satisfaction today. Our contact information was provided should additional questions or concerns arise. Thank  you for the referral and allowing [place] to share in the care of your patient.   Faith Rogue, MS, [facility] Genetic Counselor [email or web address] Phone: [phone]  The patient was seen for a total of 30 minutes in face-to-face genetic counseling.  Dr. Grayland Ormond was available for discussion regarding this case.   _______________________________________________________________________ For Office Staff:  Number of people involved in session: 1 Was an Intern/ student involved with case: no

## 2020-10-09 ENCOUNTER — Other Ambulatory Visit: Payer: Self-pay

## 2020-10-09 ENCOUNTER — Telehealth: Payer: Self-pay | Admitting: Licensed Clinical Social Worker

## 2020-10-09 ENCOUNTER — Ambulatory Visit (INDEPENDENT_AMBULATORY_CARE_PROVIDER_SITE_OTHER): Payer: Medicare Other

## 2020-10-09 DIAGNOSIS — M810 Age-related osteoporosis without current pathological fracture: Secondary | ICD-10-CM | POA: Diagnosis not present

## 2020-10-09 MED ORDER — DENOSUMAB 60 MG/ML ~~LOC~~ SOSY
60.0000 mg | PREFILLED_SYRINGE | Freq: Once | SUBCUTANEOUS | Status: AC
  Administered 2020-10-09: 60 mg via SUBCUTANEOUS

## 2020-10-09 NOTE — Progress Notes (Addendum)
Patient presented for 13-month Prolia injection SQ to Right arm. Patient tolerated well.

## 2020-10-10 ENCOUNTER — Encounter: Payer: Self-pay | Admitting: Licensed Clinical Social Worker

## 2020-10-10 ENCOUNTER — Ambulatory Visit: Payer: Self-pay | Admitting: Licensed Clinical Social Worker

## 2020-10-10 DIAGNOSIS — Z803 Family history of malignant neoplasm of breast: Secondary | ICD-10-CM

## 2020-10-10 DIAGNOSIS — Z1379 Encounter for other screening for genetic and chromosomal anomalies: Secondary | ICD-10-CM | POA: Insufficient documentation

## 2020-10-10 DIAGNOSIS — Z800 Family history of malignant neoplasm of digestive organs: Secondary | ICD-10-CM

## 2020-10-10 NOTE — Progress Notes (Signed)
HPI:  Kimberly Schmitt was previously seen in the [place] clinic due to a family history of cancer and concerns regarding a hereditary predisposition to cancer. Please refer to our prior cancer genetics clinic note for more information regarding our discussion, assessment and recommendations, at the time. Kimberly Schmitt recent genetic test results were disclosed to her, as were recommendations warranted by these results. These results and recommendations are discussed in more detail below.  CANCER HISTORY:  Oncology History   No history exists.    FAMILY HISTORY:  We obtained a detailed, 4-generation family history.  Significant diagnoses are listed below: Family History  Problem Relation Age of Onset  . Cancer Brother 5       pancreatic  . Breast cancer Maternal Grandmother 22  . Cancer Father 47       colon  . Diabetes Sister   . Stroke Maternal Aunt   . Cancer Paternal Uncle        unk type  . Colon polyps Brother     Kimberly Schmitt has 1 son and 1 grandson, no cancers. She had 3 brothers, 1 sister, and 1 maternal half sister. Her brother had pancreatic cancer at 57 and died at 56. Another brother has had over 10 colon polyps.  Kimberly Schmitt mother is living at 23, as are her two maternal aunts. Her maternal uncle passed away young in an accident. No known cancers in maternal cousins. Maternal grandmother had breast cancer at 38 and died at 2, grandfather died at 42 with no cancer history.  Kimberly Schmitt biological father (he did not raise her) had colon cancer and died at 41. Kimberly Schmitt had 2 paternal aunts and 2 paternal uncles. One uncle had cancer, unknown type. No other known cancers on this side of the family, but limited information.   Kimberly Schmitt is unaware of previous family history of genetic testing for hereditary cancer risks. Kimberly Schmitt's maternal ancestors are of unknown descent, and paternal ancestors are of unknown descent. There is no reported Ashkenazi Jewish  ancestry. There is no known consanguinity.    GENETIC TEST RESULTS: Genetic testing reported out on 10/08/2020 through the [facility] Multi-Cancer+RNA cancer panel found no pathogenic mutations.   The Multi-Cancer Panel + RNA offered by [facility] includes sequencing and/or deletion duplication testing of the following 84 genes: AIP, ALK, APC, ATM, AXIN2,BAP1,  BARD1, BLM, BMPR1A, BRCA1, BRCA2, BRIP1, CASR, CDC73, CDH1, CDK4, CDKN1B, CDKN1C, CDKN2A (p14ARF), CDKN2A (p16INK4a), CEBPA, CHEK2, CTNNA1, DICER1, DIS3L2, EGFR (c.2369C>T, p.Thr790Met variant only), EPCAM (Deletion/duplication testing only), FH, FLCN, GATA2, GPC3, GREM1 (Promoter region deletion/duplication testing only), HOXB13 (c.251G>A, p.Gly84Glu), HRAS, KIT, MAX, MEN1, MET, MITF (c.952G>A, p.Glu318Lys variant only), MLH1, MSH2, MSH3, MSH6, MUTYH, NBN, NF1, NF2, NTHL1, PALB2, PDGFRA, PHOX2B, PMS2, POLD1, POLE, POT1, PRKAR1A, PTCH1, PTEN, RAD50, RAD51C, RAD51D, RB1, RECQL4, RET, RUNX1, SDHAF2, SDHA (sequence changes only), SDHB, SDHC, SDHD, SMAD4, SMARCA4, SMARCB1, SMARCE1, STK11, SUFU, TERC, TERT, TMEM127, TP53, TSC1, TSC2, VHL, WRN and WT1.   The test report has been scanned into EPIC and is located under the Molecular Pathology section of the Results Review tab.  A portion of the result report is included below for reference.     We discussed with Kimberly Schmitt that because current genetic testing is not perfect, it is possible there may be a gene mutation in one of these genes that current testing cannot detect, but that chance is small.  We also discussed, that there could be another gene that has not yet been  discovered, or that we have not yet tested, that is responsible for the cancer diagnoses in the family. It is also possible there is a hereditary cause for the cancer in the family that Kimberly Schmitt did not inherit and therefore was not identified in her testing.  Therefore, it is important to remain in touch with cancer genetics in the future  so that we can continue to offer Kimberly Schmitt the most up to date genetic testing.   Genetic testing did identify a variant of uncertain significance (VUS) in the BLM gene called c.3892G>A.  At this time, it is unknown if this variant is associated with increased cancer risk or if this is a normal finding, but most variants such as this get reclassified to being inconsequential. It should not be used to make medical management decisions. With time, we suspect the lab will determine the significance of this variant, if any. If we do learn more about it, we will try to contact Kimberly Schmitt to discuss it further. However, it is important to stay in touch with [place] periodically and keep the address and phone number up to date.  ADDITIONAL GENETIC TESTING: We discussed with Kimberly Schmitt that her genetic testing was fairly extensive.  If there are genes identified to increase cancer risk that can be analyzed in the future, we would be happy to discuss and coordinate this testing at that time.    CANCER SCREENING RECOMMENDATIONS: Kimberly Schmitt test result is considered negative (normal).  This means that we have not identified a hereditary cause for her  family history of cancer at this time.   While reassuring, this does not definitively rule out a hereditary predisposition to cancer. It is still possible that there could be genetic mutations that are undetectable by current technology. There could be genetic mutations in genes that have not been tested or identified to increase cancer risk.  Therefore, it is recommended she continue to follow the cancer management and screening guidelines provided by her  primary healthcare provider.   An individual's cancer risk and medical management are not determined by genetic test results alone. Overall cancer risk assessment incorporates additional factors, including personal medical history, family history, and any available genetic information that may result in a personalized  plan for cancer prevention and surveillance.  RECOMMENDATIONS FOR FAMILY MEMBERS:  Relatives in this family might be at some increased risk of developing cancer, over the general population risk, simply due to the family history of cancer.  We recommended female relatives in this family have a yearly mammogram beginning at age 30, or 40 years younger than the earliest onset of cancer, an annual clinical breast exam, and perform monthly breast self-exams. Female relatives in this family should also have a gynecological exam as recommended by their primary provider.  All family members should be referred for colonoscopy starting at age 54.    It is also possible there is a hereditary cause for the cancer in Kimberly Schmitt family that she did not inherit and therefore was not identified in her.  Based on Kimberly Schmitt family history, we recommended siblings/those related to her brother with pancreatic cancer have genetic counseling and testing. Kimberly Schmitt will let us know if we can be of any assistance in coordinating genetic counseling and/or testing for these family members.  FOLLOW-UP: Lastly, we discussed with Kimberly Schmitt that cancer genetics is a rapidly advancing field and it is possible that new genetic tests will be appropriate for her and/or her  family members in the future. We encouraged her to remain in contact with cancer genetics on an annual basis so we can update her personal and family histories and let her know of advances in cancer genetics that may benefit this family.   Our contact number was provided. Kimberly Schmitt questions were answered to her satisfaction, and she knows she is welcome to call us at anytime with additional questions or concerns.   Faith Rogue, MS, [facility] Genetic Counselor [email or web address] Phone: [phone]

## 2020-10-10 NOTE — Telephone Encounter (Signed)
Revealed negative genetic testing.  Revealed that a VUS in BLM was identified. This normal result is reassuring. It is unlikely that there is an increased risk of cancer due to a mutation in one of these genes.  However, genetic testing is not perfect, and cannot definitively rule out a hereditary cause.  It will be important for her to keep in contact with genetics to learn if any additional testing may be needed in the future.

## 2020-10-21 NOTE — Progress Notes (Signed)
Provider has been added to note to [place].

## 2020-11-15 DIAGNOSIS — Z20822 Contact with and (suspected) exposure to covid-19: Secondary | ICD-10-CM | POA: Diagnosis not present

## 2020-12-02 ENCOUNTER — Ambulatory Visit (INDEPENDENT_AMBULATORY_CARE_PROVIDER_SITE_OTHER): Payer: Medicare Other

## 2020-12-02 ENCOUNTER — Telehealth: Payer: Self-pay

## 2020-12-02 VITALS — Ht 59.5 in | Wt 96.5 lb

## 2020-12-02 DIAGNOSIS — Z0000 Encounter for general adult medical examination without abnormal findings: Secondary | ICD-10-CM | POA: Diagnosis not present

## 2020-12-02 NOTE — Progress Notes (Signed)
Subjective:   Kimberly Schmitt is a 70 y.o. female who presents for Medicare Annual (Subsequent) preventive examination.  Review of Systems    No ROS.  Medicare Wellness Virtual Visit.  Visual/audio telehealth visit, UTA vital signs.   See social history for additional risk factors.   Cardiac Risk Factors include: advanced age (>35mn, >>37women)     Objective:    Today's Vitals   12/02/20 0907  Weight: 96 lb 8 oz (43.8 kg)  Height: 4' 11.5" (1.511 m)   Body mass index is 19.16 kg/m.  Advanced Directives 12/02/2020 09/20/2020 01/13/2020 11/30/2019 11/29/2018 10/04/2015  Does Patient Have a Medical Advance Directive? Yes Yes Yes No Yes No;Yes  Type of AParamed[place]of [place] City[place] will Living will [place] will - [place] AcresLiving will [place] will  Does patient want to make changes to medical advance directive? No - Patient declined - - - No - Patient declined -  Copy of HAmbiain Chart? No - copy requested - - - No - copy requested -  Would patient like information on creating a medical advance directive? - - - No - Patient declined - -    Current Medications (verified) Outpatient Encounter Medications as of 12/02/2020  Medication Sig   aspirin 81 MG tablet Take 81 mg by mouth daily.   atorvastatin (LIPITOR) 20 MG tablet TAKE 1 TABLET (20 MG TOTAL) BY MOUTH DAILY.   Calcium Carbonate-Vit D-Min (CALCIUM 1200 PO) Take 1 capsule by mouth daily.   cholecalciferol (VITAMIN D) 1000 units tablet Take 1,000 Units by mouth daily.   LORazepam (ATIVAN) 1 MG tablet TAKE 1 TABLET (1 MG TOTAL) BY MOUTH 2 (TWO) TIMES DAILY. AS NEEDED FOR INSOMNIA AND ANXIETY   PARoxetine (PAXIL) 10 MG tablet TAKE 1 TABLET (10 MG TOTAL) BY MOUTH DAILY. AFTER DINNER   No facility-administered encounter medications on file as of 12/02/2020.    Allergies (verified) Patient has no known allergies.    History: Past Medical History:  Diagnosis Date   Family history of breast cancer    Family history of colon cancer    Family history of pancreatic cancer    Hyperlipidemia    220 per patient   Personal history of tobacco use, presenting hazards to health 06/12/2015   Visit for preventive health examination 04/17/2013   .    Past Surgical History:  Procedure Laterality Date   APPENDECTOMY  1968   COLONOSCOPY     COLONOSCOPY     COLONOSCOPY WITH PROPOFOL N/A 09/20/2020   Procedure: COLONOSCOPY WITH PROPOFOL;  Surgeon: BRobert Bellow MD;  Location: [facility] END[facility]COPY;  Service: Endoscopy;  Laterality: N/A;   Family History  Problem Relation Age of Onset   Cancer Brother 522      pancreatic   Breast cancer Maternal Grandmother [phone]  Cancer Father 631      colon   Diabetes Sister    Stroke Maternal Aunt    Cancer Paternal Uncle        unk type   Colon polyps Brother    Social History   Socioeconomic History   Marital status: Married    Spouse name: Not on file   Number of children: Not on file   Years of education: Not on file   Highest education level: Not on file  Occupational History   Not on file  Tobacco Use   Smoking status: Every  Day    Packs/day: 0.50    Years: 45.00    Pack years: 22.50    Types: Cigarettes   Smokeless tobacco: Never  Substance and Sexual Activity   Alcohol use: No    Alcohol/week: 0.0 standard drinks   Drug use: No   Sexual activity: Not on file  Other Topics Concern   Not on file  Social History Narrative   Not on file   Social Determinants of Health   Financial Resource Strain: Low Risk    Difficulty of Paying Living Expenses: Not hard at all  Food Insecurity: No Food Insecurity   Worried About Charity fundraiser in the Last Year: Never true   Ran Out of Food in the Last Year: Never true  Transportation Needs: No Transportation Needs   Lack of Transportation (Medical): No   Lack of Transportation (Non-Medical): No   Physical Activity: Sufficiently Active   Days of Exercise per Week: 5 days   Minutes of Exercise per Session: 30 min  Stress: No Stress Concern Present   Feeling of Stress : Not at all  Social Connections: Unknown   Frequency of Communication with Friends and Family: More than three times a week   Frequency of Social Gatherings with Friends and Family: More than three times a week   Attends Religious Services: Not on file   Active Member of Clubs or Organizations: Not on file   Attends Archivist Meetings: Not on file   Marital Status: Widowed    Tobacco Counseling Ready to quit: Not Answered Counseling given: Not Answered   Clinical Intake:  Pre-visit preparation completed: Yes        Diabetes: No  How often do you need to have someone help you when you read instructions, pamphlets, or other written materials from your doctor or pharmacy?: 1 - Never   Interpreter Needed?: No      Activities of Daily Living In your present state of health, do you have any difficulty performing the following activities: 12/02/2020  Hearing? N  Vision? N  Difficulty concentrating or making decisions? N  Walking or climbing stairs? N  Dressing or bathing? N  Doing errands, shopping? N  Preparing Food and eating ? N  Using the Toilet? N  In the past six months, have you accidently leaked urine? N  Do you have problems with loss of bowel control? N  Managing your Medications? N  Managing your Finances? N  Housekeeping or managing your Housekeeping? N  Some recent data might be hidden    Patient Care Team: Crecencio Mc, MD as PCP - General (Internal Medicine)  Indicate any recent Medical Services you may have received from other than [facility] providers in the past year (date may be approximate).     Assessment:   This is a routine wellness examination for Kimberly Schmitt.  I connected with Chrisha today by telephone and verified that I am speaking with the correct person  using two identifiers. Location patient: home Location provider: work Persons participating in the virtual visit: patient, Marine scientist.    I discussed the limitations, risks, security and privacy concerns of performing an evaluation and management service by telephone and the availability of in person appointments. The patient expressed understanding and verbally consented to this telephonic visit.    Interactive audio and video telecommunications were attempted between this provider and patient, however failed, due to patient having technical difficulties OR patient did not have access to video capability.  We  continued and completed visit with audio only.  Some vital signs may be absent or patient reported.   Hearing/Vision screen Hearing Screening - Comments:: Patient is able to hear conversational tones without difficulty.  No issues reported. Vision Screening - Comments:: Wears corrective lenses Visual acuity not assessed, virtual visit.  They have seen their ophthalmologist in the last 12 months.    Dietary issues and exercise activities discussed: Current Exercise Habits: Home exercise routine, Type of exercise: walking, Time (Minutes): 30, Frequency (Times/Week): 5, Weekly Exercise (Minutes/Week): 150 Healthy diet Good water intake   Goals Addressed               This Visit's Progress     Patient Stated     Follow up with Primary Care Provider (pt-stated)        Patient to reschedule annual screenings  Follow with pcp as needed       Depression Screen PHQ 2/9 Scores 12/02/2020 11/30/2019 11/29/2018 06/22/2018 06/16/2017 05/13/2016 06/05/2015  PHQ - 2 Score 0 1 0 1 0 0 0  PHQ- 9 Score - - - 2 2 - -    Fall Risk Fall Risk  12/02/2020 03/06/2020 01/16/2020 11/30/2019 08/25/2019  Falls in the past year? 0 0 0 0 0  Number falls in past yr: 0 - - 0 -  Injury with Fall? 0 - - 0 -  Follow up Falls evaluation completed Falls evaluation completed Falls evaluation completed Falls  evaluation completed Falls evaluation completed    [place]: Handrails in use when climbing stairs? Yes Home free of loose throw rugs in walkways, pet beds, electrical cords, etc? Yes  Adequate lighting in your home to reduce risk of falls? Yes   ASSISTIVE DEVICES UTILIZED TO PREVENT FALLS: Use of a cane, walker or w/c? No   TIMED UP AND GO: Was the test performed? No .   Cognitive Function:  Patient is alert and oriented x3.    6CIT Screen 12/02/2020 11/30/2019 11/29/2018  What Year? 0 points 0 points 0 points  What month? 0 points 0 points 0 points  What time? 0 points 0 points 0 points  Count back from 20 0 points - 0 points  Months in reverse 0 points 0 points 0 points  Repeat phrase 0 points - 0 points  Total Score 0 - 0    Immunizations Immunization History  Administered Date(s) Administered   Influenza Split 01/16/2013, 01/27/2014, 01/24/2015   Influenza Whole 03/04/2011, 01/27/2012   Influenza, High Dose Seasonal PF 02/03/2017, 01/11/2018   Influenza-Unspecified 01/24/2016, 02/05/2017, 02/07/2019, 02/13/2020   PFIZER(Purple Top)SARS-COV-2 Vaccination 07/05/2019, 07/26/2019   Pneumococcal Conjugate-13 05/02/2014   Pneumococcal Polysaccharide-23 04/11/2012, 06/16/2017   Tdap 04/07/2011   Zoster Recombinat (Shingrix) 09/26/2017, 02/03/2018   Health Maintenance Health Maintenance  Topic Date Due   COVID-19 Vaccine (3 - Booster for [place] series) 12/18/2020 (Originally 12/26/2019)   INFLUENZA VACCINE  12/09/2020   TETANUS/TDAP  04/06/2021   MAMMOGRAM  07/30/2021   COLONOSCOPY (Pts 45-75yr Insurance coverage will need to be confirmed)  09/20/2025   DEXA SCAN  Completed   Hepatitis C Screening  Completed   PNA vac Low Risk Adult  Completed   Zoster Vaccines- Shingrix  Completed   HPV VACCINES  Aged Out    Lung Cancer Screening: completed  Vision Screening: Recommended annual ophthalmology exams for early detection of glaucoma  and other disorders of the eye.  Dental Screening: Recommended annual dental exams for proper oral  hygiene.  Community Resource Referral / Chronic Care Management: CRR required this visit?  No   CCM required this visit?  No      Plan:   Keep all routine maintenance appointments.   I have personally reviewed and noted the following in the patient's chart:   Medical and social history Use of alcohol, tobacco or illicit drugs  Current medications and supplements including opioid prescriptions.  Functional ability and status Nutritional status Physical activity Advanced directives List of other physicians Hospitalizations, surgeries, and ER visits in previous 12 months Vitals Screenings to include cognitive, depression, and falls Referrals and appointments  In addition, I have reviewed and discussed with patient certain preventive protocols, quality metrics, and best practice recommendations. A written personalized care plan for preventive services as well as general preventive health recommendations were provided to patient via mail.     Varney Biles, LPN   [id number]

## 2020-12-02 NOTE — Patient Instructions (Addendum)
Kimberly Schmitt , Thank you for taking time to come for your Medicare Wellness Visit. I appreciate your ongoing commitment to your health goals. Please review the following plan we discussed and let me know if I can assist you in the future.   These are the goals we discussed:  Goals       Patient Stated     Follow up with Primary Care Provider (pt-stated)      Patient to reschedule annual screenings  Follow with pcp as needed      Other     Quit Smoking        This is a list of the screening recommended for you and due dates:  Health Maintenance  Topic Date Due   COVID-19 Vaccine (3 - Booster for Pfizer series) 12/18/2020*   Flu Shot  12/09/2020   Tetanus Vaccine  04/06/2021   Mammogram  07/30/2021   Colon Cancer Screening  09/20/2025   DEXA scan (bone density measurement)  Completed   Hepatitis C Screening: USPSTF Recommendation to screen - Ages 92-79 yo.  Completed   Pneumonia vaccines  Completed   Zoster (Shingles) Vaccine  Completed   HPV Vaccine  Aged Out  *Topic was postponed. The date shown is not the original due date.    Advanced directives: End of life planning; Advance aging; Advanced directives discussed.  Copy of current HCPOA/Living Will requested.    Conditions/risks identified: none new  Follow up in one year for your annual wellness visit    Preventive Care 65 Years and Older, Female Preventive care refers to lifestyle choices and visits with your health care provider that can promote health and wellness. What does preventive care include? A yearly physical exam. This is also called an annual well check. Dental exams once or twice a year. Routine eye exams. Ask your health care provider how often you should have your eyes checked. Personal lifestyle choices, including: Daily care of your teeth and gums. Regular physical activity. Eating a healthy diet. Avoiding tobacco and drug use. Limiting alcohol use. Practicing safe sex. Taking low-dose aspirin  every day. Taking vitamin and mineral supplements as recommended by your health care provider. What happens during an annual well check? The services and screenings done by your health care provider during your annual well check will depend on your age, overall health, lifestyle risk factors, and family history of disease. Counseling  Your health care provider may ask you questions about your: Alcohol use. Tobacco use. Drug use. Emotional well-being. Home and relationship well-being. Sexual activity. Eating habits. History of falls. Memory and ability to understand (cognition). Work and work Statistician. Reproductive health. Screening  You may have the following tests or measurements: Height, weight, and BMI. Blood pressure. Lipid and cholesterol levels. These may be checked every 5 years, or more frequently if you are over 26 years old. Skin check. Lung cancer screening. You may have this screening every year starting at age 32 if you have a 30-pack-year history of smoking and currently smoke or have quit within the past 15 years. Fecal occult blood test (FOBT) of the stool. You may have this test every year starting at age 54. Flexible sigmoidoscopy or colonoscopy. You may have a sigmoidoscopy every 5 years or a colonoscopy every 10 years starting at age 3. Hepatitis C blood test. Hepatitis B blood test. Sexually transmitted disease (STD) testing. Diabetes screening. This is done by checking your blood sugar (glucose) after you have not eaten for a while (fasting).  You may have this done every 1-3 years. Bone density scan. This is done to screen for osteoporosis. You may have this done starting at age 29. Mammogram. This may be done every 1-2 years. Talk to your health care provider about how often you should have regular mammograms. Talk with your health care provider about your test results, treatment options, and if necessary, the need for more tests. Vaccines  Your health  care provider may recommend certain vaccines, such as: Influenza vaccine. This is recommended every year. Tetanus, diphtheria, and acellular pertussis (Tdap, Td) vaccine. You may need a Td booster every 10 years. Zoster vaccine. You may need this after age 87. Pneumococcal 13-valent conjugate (PCV13) vaccine. One dose is recommended after age 43. Pneumococcal polysaccharide (PPSV23) vaccine. One dose is recommended after age 14. Talk to your health care provider about which screenings and vaccines you need and how often you need them. This information is not intended to replace advice given to you by your health care provider. Make sure you discuss any questions you have with your health care provider. Document Released: 05/24/2015 Document Revised: 01/15/2016 Document Reviewed: 02/26/2015 Elsevier Interactive Patient Education  2017 [employer] Prevention in the Home Falls can cause injuries. They can happen to people of all ages. There are many things you can do to make your home safe and to help prevent falls. What can I do on the outside of my home? Regularly fix the edges of walkways and driveways and fix any cracks. Remove anything that might make you trip as you walk through a door, such as a raised step or threshold. Trim any bushes or trees on the path to your home. Use bright outdoor lighting. Clear any walking paths of anything that might make someone trip, such as rocks or tools. Regularly check to see if handrails are loose or broken. Make sure that both sides of any steps have handrails. Any raised decks and porches should have guardrails on the edges. Have any leaves, snow, or ice cleared regularly. Use sand or salt on walking paths during winter. Clean up any spills in your garage right away. This includes oil or grease spills. What can I do in the bathroom? Use night lights. Install grab bars by the toilet and in the tub and shower. Do not use towel bars as grab  bars. Use non-skid mats or decals in the tub or shower. If you need to sit down in the shower, use a plastic, non-slip stool. Keep the floor dry. Clean up any water that spills on the floor as soon as it happens. Remove soap buildup in the tub or shower regularly. Attach bath mats securely with double-sided non-slip rug tape. Do not have throw rugs and other things on the floor that can make you trip. What can I do in the bedroom? Use night lights. Make sure that you have a light by your bed that is easy to reach. Do not use any sheets or blankets that are too big for your bed. They should not hang down onto the floor. Have a firm chair that has side arms. You can use this for support while you get dressed. Do not have throw rugs and other things on the floor that can make you trip. What can I do in the kitchen? Clean up any spills right away. Avoid walking on wet floors. Keep items that you use a lot in easy-to-reach places. If you need to reach something above you, use a  strong step stool that has a grab bar. Keep electrical cords out of the way. Do not use floor polish or wax that makes floors slippery. If you must use wax, use non-skid floor wax. Do not have throw rugs and other things on the floor that can make you trip. What can I do with my stairs? Do not leave any items on the stairs. Make sure that there are handrails on both sides of the stairs and use them. Fix handrails that are broken or loose. Make sure that handrails are as long as the stairways. Check any carpeting to make sure that it is firmly attached to the stairs. Fix any carpet that is loose or worn. Avoid having throw rugs at the top or bottom of the stairs. If you do have throw rugs, attach them to the floor with carpet tape. Make sure that you have a light switch at the top of the stairs and the bottom of the stairs. If you do not have them, ask someone to add them for you. What else can I do to help prevent  falls? Wear shoes that: Do not have high heels. Have rubber bottoms. Are comfortable and fit you well. Are closed at the toe. Do not wear sandals. If you use a stepladder: Make sure that it is fully opened. Do not climb a closed stepladder. Make sure that both sides of the stepladder are locked into place. Ask someone to hold it for you, if possible. Clearly mark and make sure that you can see: Any grab bars or handrails. First and last steps. Where the edge of each step is. Use tools that help you move around (mobility aids) if they are needed. These include: Canes. Walkers. Scooters. Crutches. Turn on the lights when you go into a dark area. Replace any light bulbs as soon as they burn out. Set up your furniture so you have a clear path. Avoid moving your furniture around. If any of your floors are uneven, fix them. If there are any pets around you, be aware of where they are. Review your medicines with your doctor. Some medicines can make you feel dizzy. This can increase your chance of falling. Ask your doctor what other things that you can do to help prevent falls. This information is not intended to replace advice given to you by your health care provider. Make sure you discuss any questions you have with your health care provider. Document Released: 02/21/2009 Document Revised: 10/03/2015 Document Reviewed: 06/01/2014 Elsevier Interactive Patient Education  2017 [employer].

## 2020-12-02 NOTE — Telephone Encounter (Signed)
Opened in error

## 2021-01-16 ENCOUNTER — Encounter: Payer: Self-pay | Admitting: Internal Medicine

## 2021-01-16 ENCOUNTER — Ambulatory Visit (INDEPENDENT_AMBULATORY_CARE_PROVIDER_SITE_OTHER): Payer: Medicare Other

## 2021-01-16 ENCOUNTER — Other Ambulatory Visit: Payer: Self-pay

## 2021-01-16 ENCOUNTER — Ambulatory Visit (INDEPENDENT_AMBULATORY_CARE_PROVIDER_SITE_OTHER): Payer: Medicare Other | Admitting: Internal Medicine

## 2021-01-16 VITALS — BP 98/62 | HR 77 | Temp 97.6°F | Ht 59.5 in | Wt 93.8 lb

## 2021-01-16 DIAGNOSIS — F419 Anxiety disorder, unspecified: Secondary | ICD-10-CM

## 2021-01-16 DIAGNOSIS — M545 Low back pain, unspecified: Secondary | ICD-10-CM | POA: Diagnosis not present

## 2021-01-16 DIAGNOSIS — Z720 Tobacco use: Secondary | ICD-10-CM | POA: Diagnosis not present

## 2021-01-16 DIAGNOSIS — E559 Vitamin D deficiency, unspecified: Secondary | ICD-10-CM | POA: Diagnosis not present

## 2021-01-16 DIAGNOSIS — I700 Atherosclerosis of aorta: Secondary | ICD-10-CM | POA: Diagnosis not present

## 2021-01-16 DIAGNOSIS — F5105 Insomnia due to other mental disorder: Secondary | ICD-10-CM

## 2021-01-16 DIAGNOSIS — Z23 Encounter for immunization: Secondary | ICD-10-CM | POA: Diagnosis not present

## 2021-01-16 DIAGNOSIS — F411 Generalized anxiety disorder: Secondary | ICD-10-CM | POA: Diagnosis not present

## 2021-01-16 DIAGNOSIS — M5136 Other intervertebral disc degeneration, lumbar region: Secondary | ICD-10-CM | POA: Diagnosis not present

## 2021-01-16 DIAGNOSIS — E7800 Pure hypercholesterolemia, unspecified: Secondary | ICD-10-CM | POA: Diagnosis not present

## 2021-01-16 DIAGNOSIS — M4316 Spondylolisthesis, lumbar region: Secondary | ICD-10-CM | POA: Diagnosis not present

## 2021-01-16 DIAGNOSIS — M810 Age-related osteoporosis without current pathological fracture: Secondary | ICD-10-CM | POA: Diagnosis not present

## 2021-01-16 DIAGNOSIS — Z9229 Personal history of other drug therapy: Secondary | ICD-10-CM | POA: Diagnosis not present

## 2021-01-16 DIAGNOSIS — L509 Urticaria, unspecified: Secondary | ICD-10-CM

## 2021-01-16 LAB — LIPID PANEL
Cholesterol: 155 mg/dL (ref 0–200)
HDL: 73.1 mg/dL (ref >39.00)
LDL Cholesterol: 66 mg/dL (ref 0–99)
NonHDL: 81.82
Total CHOL/HDL Ratio: 2
Triglycerides: 78 mg/dL (ref 0.0–149.0)
VLDL: 15.6 mg/dL (ref 0.0–40.0)

## 2021-01-16 LAB — COMPREHENSIVE METABOLIC PANEL
ALT: 14 U/L (ref 0–35)
AST: 16 U/L (ref 0–37)
Albumin: 4.6 g/dL (ref 3.5–5.2)
Alkaline Phosphatase: 41 U/L (ref 39–117)
BUN: 9 mg/dL (ref 6–23)
CO2: 29 mEq/L (ref 19–32)
Calcium: 9.4 mg/dL (ref 8.4–10.5)
Chloride: 104 mEq/L (ref 96–112)
Creatinine, Ser: 0.79 mg/dL (ref 0.40–1.20)
GFR: 74.96 mL/min (ref >60.00)
Glucose, Bld: 79 mg/dL (ref 70–99)
Potassium: 4.2 mEq/L (ref 3.5–5.1)
Sodium: 142 mEq/L (ref 135–145)
Total Bilirubin: 0.6 mg/dL (ref 0.2–1.2)
Total Protein: 6.9 g/dL (ref 6.0–8.3)

## 2021-01-16 LAB — VITAMIN D 25 HYDROXY (VIT D DEFICIENCY, FRACTURES): VITD: 71.42 ng/mL (ref 30.00–100.00)

## 2021-01-16 MED ORDER — TETANUS-DIPHTH-ACELL PERTUSSIS 5-2.5-18.5 LF-MCG/0.5 IM SUSY: 0.5000 mL | PREFILLED_SYRINGE | Freq: Once | INTRAMUSCULAR | 0 refills | Status: AC

## 2021-01-16 MED ORDER — DOXYCYCLINE HYCLATE 100 MG PO TABS: 100.0000 mg | ORAL_TABLET | Freq: Two times a day (BID) | ORAL | 0 refills | Status: DC

## 2021-01-16 NOTE — Patient Instructions (Addendum)
You will need your tetanus-diptheria-pertussis vaccine (TDaP)   this fall,  but you can get it for less $$$ at a local pharmacy with the script I have provided you.     [facility] Spotted Fever  can present with fever and a headache, NOT a rash; s the occurrence of these symptoms during spring/summer/fall in the context of recent tick exposure is RMSF until proven otherwise and should be treated immediately with doxycycline.  I have sent this rx to your pharmacy to use if you develop these symptoms.  Let me know if this occurs so we can set you up for the appropriate testing .   DEXA scan is due in March 2023

## 2021-01-16 NOTE — Progress Notes (Signed)
Patient ID: Kimberly Schmitt, female    DOB: 07/08/1950  Age: 70 y.o. MRN: [id number]  The patient is here for follow up and management of other chronic and acute problems.  This visit occurred during the SARS-CoV-2 public health emergency.  Safety protocols were in place, including screening questions prior to the visit, additional usage of staff PPE, and extensive cleaning of exam room while observing appropriate contact time as indicated for disinfecting solutions.     The risk factors are reflected in the social history.  The roster of all physicians providing medical care to patient - is listed in the Snapshot section of the chart.  Activities of daily living:  The patient is 100% independent in all ADLs: dressing, toileting, feeding as well as independent mobility  Home safety : The patient has smoke detectors in the home. They wear seatbelts.  There are no firearms at home. There is no violence in the home.   There is no risks for hepatitis, STDs or HIV. There is no   history of blood transfusion. They have no travel history to infectious disease endemic areas of the world.  The patient has seen their dentist in the last six month. They have seen their eye doctor in the last year. She denies  hearing difficulty with regard to whispered voices and some television programs.  They have deferred audiologic testing in the last year.  They do not  have excessive sun exposure. Discussed the need for sun protection: hats, long sleeves and use of sunscreen if there is significant sun exposure.   Diet: the importance of a healthy diet is discussed. They do have a healthy diet.  The benefits of regular aerobic exercise were discussed. She worsk in her yard several times per week and walks \.  Depression screen: there are no signs or vegative symptoms of depression- irritability, change in appetite, anhedonia, sadness/tearfullness.  Cognitive assessment: the patient manages all their financial and  personal affairs and is actively engaged. They could relate day,date,year and events; recalled 2/3 objects at 3 minutes; performed clock-face test normally.  The following portions of the patient's history were reviewed and updated as appropriate: allergies, current medications, past family history, past medical history,  past surgical history, past social history  and problem list.  Visual acuity was not assessed per patient preference since she has regular follow up with her ophthalmologist. Hearing and body mass index were assessed and reviewed.   During the course of the visit the patient was educated and counseled about appropriate screening and preventive services including : fall prevention , diabetes screening, nutrition counseling, colorectal cancer screening, and recommended immunizations.    CC: The primary encounter diagnosis was Need for immunization against influenza. Diagnoses of Age-related osteoporosis without current pathological fracture, COVID-19 vaccine series completed, Vitamin D deficiency, Pure hypercholesterolemia, Acute midline low back pain without sciatica, Tobacco abuse, Insomnia secondary to anxiety, GAD (generalized anxiety disorder), Urticaria, and Aortic atherosclerosis ([place]) were also pertinent to this visit.  1) Urticaria  taking allegra no episodes of itching since starting daily use of allegra   2) using lorazepam  once a week prn ,  3) taking paxil prn . Worried about her 2 yr old mother Ms Kimberly Schmitt (does not live alone anymore due to decline) has 5 siblings who help especially [name] the youngest of the 5   4) taking Prolia for osteoporosis  repeat Dexa in march 2023.n  5) recent episode of lumbar back pain after yardwork,  took  a prednisone taper she had filled but not used  6) Aortic arch atherosclerosis:  Reviewed findings of prior CT scan today..  Patient  Is taking atorvastatin.   [name] has a past medical history of Family history of breast  cancer, Family history of colon cancer, Family history of pancreatic cancer, Hyperlipidemia, Personal history of tobacco use, presenting hazards to health (06/12/2015), and Visit for preventive health examination (04/17/2013).   She has a past surgical history that includes Appendectomy (1968); Colonoscopy; Colonoscopy; and Colonoscopy with propofol (N/A, 09/20/2020).   Her family history includes Breast cancer (age of onset: 13) in her maternal grandmother; Cancer in her paternal uncle; Cancer (age of onset: 65) in her brother; Cancer (age of onset: 72) in her father; Colon polyps in her brother; Diabetes in her sister; Stroke in her maternal aunt.She reports that she has been smoking cigarettes. She has a 22.50 pack-year smoking history. She has never used smokeless tobacco. She reports that she does not drink alcohol and does not use drugs.  Outpatient Medications Prior to Visit  Medication Sig Dispense Refill   aspirin 81 MG tablet Take 81 mg by mouth daily.     atorvastatin (LIPITOR) 20 MG tablet TAKE 1 TABLET (20 MG TOTAL) BY MOUTH DAILY. 90 tablet 3   Calcium Carbonate-Vit D-Min (CALCIUM 1200 PO) Take 1 capsule by mouth daily.     cholecalciferol (VITAMIN D) 1000 units tablet Take 1,000 Units by mouth daily.     LACTOBACILLUS PROBIOTIC PO Take 1 capsule by mouth daily.     LORazepam (ATIVAN) 1 MG tablet TAKE 1 TABLET (1 MG TOTAL) BY MOUTH 2 (TWO) TIMES DAILY. AS NEEDED FOR INSOMNIA AND ANXIETY 30 tablet 5   PARoxetine (PAXIL) 10 MG tablet TAKE 1 TABLET (10 MG TOTAL) BY MOUTH DAILY. AFTER DINNER 90 tablet 2   No facility-administered medications prior to visit.    Review of Systems  Patient denies headache, fevers, malaise, unintentional weight loss, skin rash, eye pain, sinus congestion and sinus pain, sore throat, dysphagia,  hemoptysis , cough, dyspnea, wheezing, chest pain, palpitations, orthopnea, edema, abdominal pain, nausea, melena, diarrhea, constipation, flank pain, dysuria,  hematuria, urinary  Frequency, nocturia, numbness, tingling, seizures,  Focal weakness, Loss of consciousness,  Tremor, insomnia, depression, anxiety, and suicidal ideation.     Objective:  BP 98/62 (BP Location: Left Arm, Patient Position: Sitting, Cuff Size: Normal)   Pulse 77   Temp 97.6 F (36.4 C) (Oral)   Ht 4' 11.5" (1.511 m)   Wt 93 lb 12.8 oz (42.5 kg)   SpO2 97%   BMI 18.63 kg/m   Physical Exam  General appearance: alert, cooperative and appears stated age Head: Normocephalic, without obvious abnormality, atraumatic Eyes: conjunctivae/corneas clear. PERRL, EOM's intact. Fundi benign. Ears: normal TM's and external ear canals both ears Nose: Nares normal. Septum midline. Mucosa normal. No drainage or sinus tenderness. Throat: lips, mucosa, and tongue normal; teeth and gums normal Neck: no adenopathy, no carotid bruit, no JVD, supple, symmetrical, trachea midline and thyroid not enlarged, symmetric, no tenderness/mass/nodules Lungs: clear to auscultation bilaterally Breasts: normal appearance, no masses or tenderness Heart: regular rate and rhythm, S1, S2 normal, no murmur, click, rub or gallop Abdomen: soft, non-tender; bowel sounds normal; no masses,  no organomegaly Extremities: extremities normal, atraumatic, no cyanosis or edema Pulses: 2+ and symmetric Skin: Skin color, texture, turgor normal. No rashes or lesions Neurologic: Alert and oriented X 3, normal strength and tone. Normal symmetric reflexes. Normal coordination and gait.  Assessment & Plan:   Problem List Items Addressed This Visit       Unprioritized   Tobacco abuse    Continued  use since Junior passed away. Does not want to quit ("I've been smoking for 50 yrs")  She really has no desire to quit despite an acknowledgement of the current and future hazards to patient's  health,  Patient was encouraged to continue her annual lung CT which was done in February 01, 2020       Hyperlipidemia    Relevant Orders   Lipid panel (Completed)   Comprehensive metabolic panel (Completed)   Vitamin D deficiency   Relevant Orders   VITAMIN D 25 Hydroxy (Vit-D Deficiency, Fractures) (Completed)   Insomnia secondary to anxiety    Resolved with Junior's passing. She has not used  the lorazepam more than once a week . The risks and benefits of benzodiazepine use were discussed with patient today including excessive sedation leading to respiratory depression,  impaired thinking/driving, and addiction.  Patient was advised to avoid concurrent use with alcohol, to use medication only as needed and not to share with others  .        GAD (generalized anxiety disorder)      encouraged to wean off of paxil rather than stopping abruptly. She is undecided about stopping it      Osteoporosis   Relevant Orders   DG Bone Density   Urticaria    No recurrence since starting daily fexofenadine      Low back pain    Plain films were negative for fracture,  Only mild degenerative changes      Relevant Orders   DG Lumbar Spine Complete (Completed)   Aortic atherosclerosis ([place])    Reviewed findings of prior CT scan today..  Patient is tolerating high potency statin therapy   Lab Results  Component Value Date   CHOL 155 01/16/2021   HDL 73.10 01/16/2021   LDLCALC 66 01/16/2021   LDLDIRECT 147.5 04/17/2013   TRIG 78.0 01/16/2021   CHOLHDL 2 01/16/2021         Other Visit Diagnoses     Need for immunization against influenza    -  Primary   Relevant Orders   Flu Vaccine QUAD High Dose(Fluad) (Completed)   COVID-19 vaccine series completed       Relevant Orders   SARS-CoV-2 Semi-Quantitative Total Antibody, Spike       I spent 30 minutes dedicated to the care of this patient on the date of this encounter to include pre-visit review of his medical history,  Face-to-face time with the patient , and post visit ordering of testing and therapeutics.   Meds ordered this encounter   Medications   Tdap (BOOSTRIX) 5-2.5-18.5 LF-MCG/0.5 injection    Sig: Inject 0.5 mLs into the muscle once for 1 dose.    Dispense:  0.5 mL    Refill:  0   doxycycline (VIBRA-TABS) 100 MG tablet    Sig: Take 1 tablet (100 mg total) by mouth 2 (two) times daily.    Dispense:  20 tablet    Refill:  0    For [place] spotted fever    There are no discontinued medications.  Follow-up: No follow-ups on file.   Crecencio Mc, MD

## 2021-01-18 DIAGNOSIS — M545 Low back pain, unspecified: Secondary | ICD-10-CM | POA: Insufficient documentation

## 2021-01-18 DIAGNOSIS — I700 Atherosclerosis of aorta: Secondary | ICD-10-CM | POA: Insufficient documentation

## 2021-01-18 NOTE — Assessment & Plan Note (Signed)
Continued  use since Junior passed away. Does not want to quit ("I've been smoking for 50 yrs")  She really has no desire to quit despite an acknowledgement of the current and future hazards to patient's  health,  Patient was encouraged to continue her annual lung CT which was done in February 01, 2020

## 2021-01-18 NOTE — Assessment & Plan Note (Signed)
encouraged to wean off of paxil rather than stopping abruptly. She is undecided about stopping it

## 2021-01-18 NOTE — Assessment & Plan Note (Signed)
Reviewed findings of prior CT scan today..  Patient is tolerating high potency statin therapy   Lab Results  Component Value Date   CHOL 155 01/16/2021   HDL 73.10 01/16/2021   LDLCALC 66 01/16/2021   LDLDIRECT 147.5 04/17/2013   TRIG 78.0 01/16/2021   CHOLHDL 2 01/16/2021

## 2021-01-18 NOTE — Assessment & Plan Note (Signed)
No recurrence since starting daily fexofenadine

## 2021-01-18 NOTE — Assessment & Plan Note (Signed)
Plain films were negative for fracture,  Only mild degenerative changes

## 2021-01-18 NOTE — Assessment & Plan Note (Signed)
Resolved with Junior's passing. She has not used  the lorazepam more than once a week . The risks and benefits of benzodiazepine use were discussed with patient today including excessive sedation leading to respiratory depression,  impaired thinking/driving, and addiction.  Patient was advised to avoid concurrent use with alcohol, to use medication only as needed and not to share with others  .

## 2021-01-20 ENCOUNTER — Telehealth: Payer: Self-pay | Admitting: Internal Medicine

## 2021-01-20 LAB — SARS-COV-2 SEMI-QUANTITATIVE TOTAL ANTIBODY, SPIKE: SARS COV2 AB, Total Spike Semi QN: 88.2 U/mL — ABNORMAL HIGH (ref <0.8)

## 2021-01-20 NOTE — Telephone Encounter (Signed)
Patient is returning your call to go over her lab results and x-ray results,please call her at [phone].

## 2021-01-20 NOTE — Progress Notes (Signed)
Left message for patient to return call back for lab/xray results.

## 2021-01-22 NOTE — Progress Notes (Signed)
Left message for patient to return call back for result message.

## 2021-02-21 ENCOUNTER — Other Ambulatory Visit: Payer: Self-pay

## 2021-02-21 ENCOUNTER — Telehealth: Payer: Self-pay | Admitting: Internal Medicine

## 2021-02-21 ENCOUNTER — Emergency Department
Admission: EM | Admit: 2021-02-21 | Discharge: 2021-02-21 | Disposition: A | Discharge status: Home / Self Care | Payer: Medicare Other | Attending: Emergency Medicine | Admitting: Emergency Medicine

## 2021-02-21 ENCOUNTER — Telehealth: Payer: Self-pay

## 2021-02-21 DIAGNOSIS — F17210 Nicotine dependence, cigarettes, uncomplicated: Secondary | ICD-10-CM | POA: Insufficient documentation

## 2021-02-21 DIAGNOSIS — S0993XA Unspecified injury of face, initial encounter: Secondary | ICD-10-CM | POA: Diagnosis present

## 2021-02-21 DIAGNOSIS — Z7982 Long term (current) use of aspirin: Secondary | ICD-10-CM | POA: Diagnosis not present

## 2021-02-21 DIAGNOSIS — W278XXA Contact with other nonpowered hand tool, initial encounter: Secondary | ICD-10-CM | POA: Diagnosis not present

## 2021-02-21 DIAGNOSIS — S0181XA Laceration without foreign body of other part of head, initial encounter: Secondary | ICD-10-CM

## 2021-02-21 DIAGNOSIS — S01112A Laceration without foreign body of left eyelid and periocular area, initial encounter: Secondary | ICD-10-CM | POA: Insufficient documentation

## 2021-02-21 MED ORDER — TETANUS-DIPHTH-ACELL PERTUSSIS 5-2.5-18.5 LF-MCG/0.5 IM SUSY: 0.5000 mL | PREFILLED_SYRINGE | Freq: Once | INTRAMUSCULAR | Status: DC

## 2021-02-21 NOTE — Telephone Encounter (Signed)
Patient informed, Due to the high volume of calls and your symptoms we have to forward your call to our Triage Nurse to expedient your call. Please hold for the transfer.  Patient transferred to Access Nurse. Due to having a cut above her left eyebrow that is still bleeding after being bandaged yesterday.No openings in office or virtual for patient to be evaluated.Offered to schedule patient at another [place] office,patient declined.

## 2021-02-21 NOTE — Telephone Encounter (Signed)
Pt has been evaluated at [facility] ED for facial Laceration.  Providing second access nurse documentation

## 2021-02-21 NOTE — Discharge Instructions (Addendum)
Follow up with Dr. Derrel Nip for any sign or concern of infection.

## 2021-02-21 NOTE — ED Triage Notes (Signed)
Presents after hitting her head with a crowbar  laceration noted near left eye  states he did this last pm

## 2021-02-21 NOTE — ED Provider Notes (Signed)
[facility] Emergency Department Provider Note ____________________________________________   Event Date/Time   First MD Initiated Contact with Patient 02/21/21 1012     (approximate)  I have reviewed the triage vital signs and the nursing notes.   HISTORY  Chief Complaint Head Injury  HPI Kimberly Schmitt is a 70 y.o. female with history as listed below presents to the emergency department for treatment and evaluation for laceration to the left eyebrow that occurred yesterday evening around 730.  She states that she was trying to pry some screws out of a piece of metal by using a crowbar and the crowbar slipped and hit her eyebrow.  She attempted to clean it and apply pressure but the area continues to use and was concerned that she may need some sort of closure.  Tdap is due.         Past Medical History:  Diagnosis Date   Family history of breast cancer    Family history of colon cancer    Family history of pancreatic cancer    Hyperlipidemia    220 per patient   Personal history of tobacco use, presenting hazards to health 06/12/2015   Visit for preventive health examination 04/17/2013   .     Patient Active Problem List   Diagnosis Date Noted   Low back pain 01/18/2021   Aortic atherosclerosis ([place]) 01/18/2021   Genetic testing 10/10/2020   Family history of pancreatic cancer    Family history of colon cancer    Family history of breast cancer    Urticaria 01/16/2020   Osteoporosis 07/29/2019   GAD (generalized anxiety disorder) 02/20/2018   Insomnia secondary to anxiety 12/18/2017   Atherosclerosis of native coronary artery of native heart without angina pectoris 05/16/2016   Solitary pulmonary nodule 05/16/2016   Vitamin D deficiency 05/16/2016   Hyperlipidemia 04/21/2013   Screening for colon cancer 04/08/2011   Encounter for preventive health examination 04/08/2011   Screening for cervical cancer 04/08/2011   Tobacco abuse  04/07/2011   Tobacco abuse counseling 04/07/2011    Past Surgical History:  Procedure Laterality Date   APPENDECTOMY  1968   COLONOSCOPY     COLONOSCOPY     COLONOSCOPY WITH PROPOFOL N/A 09/20/2020   Procedure: COLONOSCOPY WITH PROPOFOL;  Surgeon: Robert Bellow, MD;  Location: [facility] ENDOSCOPY;  Service: Endoscopy;  Laterality: N/A;    Prior to Admission medications   Medication Sig Start Date End Date Taking? Authorizing Provider  aspirin 81 MG tablet Take 81 mg by mouth daily.    [provider]  atorvastatin (LIPITOR) 20 MG tablet TAKE 1 TABLET (20 MG TOTAL) BY MOUTH DAILY. 04/18/20   Crecencio Mc, MD  Calcium Carbonate-Vit D-Min (CALCIUM 1200 PO) Take 1 capsule by mouth daily.    [provider]  cholecalciferol (VITAMIN D) 1000 units tablet Take 1,000 Units by mouth daily.    [provider]  doxycycline (VIBRA-TABS) 100 MG tablet Take 1 tablet (100 mg total) by mouth 2 (two) times daily. 01/16/21   Crecencio Mc, MD  LACTOBACILLUS PROBIOTIC PO Take 1 capsule by mouth daily.    [provider]  LORazepam (ATIVAN) 1 MG tablet TAKE 1 TABLET (1 MG TOTAL) BY MOUTH 2 (TWO) TIMES DAILY. AS NEEDED FOR INSOMNIA AND ANXIETY 04/08/20   Crecencio Mc, MD  PARoxetine (PAXIL) 10 MG tablet TAKE 1 TABLET (10 MG TOTAL) BY MOUTH DAILY. AFTER DINNER 04/16/20   Crecencio Mc, MD  Allergies Patient has no known allergies.  Family History  Problem Relation Age of Onset   Cancer Brother 49       pancreatic   Breast cancer Maternal Grandmother 51   Cancer Father 4       colon   Diabetes Sister    Stroke Maternal Aunt    Cancer Paternal Uncle        unk type   Colon polyps Brother     Social History Social History   Tobacco Use   Smoking status: Every Day    Packs/day: 0.50    Years: 45.00    Pack years: 22.50    Types: Cigarettes   Smokeless tobacco: Never  Substance Use Topics   Alcohol use: No    Alcohol/week: 0.0 standard drinks    Drug use: No    Review of Systems  Constitutional: No fever/chills Eyes: No visual changes. ENT: No sore throat. Cardiovascular: Denies chest pain. Respiratory: Denies shortness of breath. Gastrointestinal: No abdominal pain.  No nausea, no vomiting.  No diarrhea.   Genitourinary: Negative for dysuria. Musculoskeletal: Negative for back pain. Skin: Negative for rash.  Positive for laceration Neurological: Negative for headaches, focal weakness or numbness.  ____________________________________________   PHYSICAL EXAM:  VITAL SIGNS: ED Triage Vitals  Enc Vitals Group     BP 02/21/21 0948 (!) 163/77     Pulse Rate 02/21/21 0948 80     Resp 02/21/21 0948 16     Temp 02/21/21 0948 97.7 F (36.5 C)     Temp Source 02/21/21 0948 Oral     SpO2 02/21/21 0948 100 %     Weight 02/21/21 0941 93 lb 11.1 oz (42.5 kg)     Height 02/21/21 0941 4' 11.5" (1.511 m)     Head Circumference --      Peak Flow --      Pain Score --      Pain Loc --      Pain Edu? --      Excl. in [place]? --     Constitutional: Alert and oriented. Well appearing and in no acute distress. Eyes: Conjunctivae are normal. PERRL. EOMI. periorbital contusion of the left eye Head: Atraumatic. Nose: No congestion/rhinnorhea. Mouth/Throat: Mucous membranes are moist.  Oropharynx non-erythematous. Neck: No stridor.   Hematological/Lymphatic/Immunilogical: No cervical lymphadenopathy. Cardiovascular: Normal rate, regular rhythm. Grossly normal heart sounds.  Good peripheral circulation. Respiratory: Normal respiratory effort.  No retractions. Lungs CTAB. Gastrointestinal: Soft and nontender. No distention. No abdominal bruits.  Musculoskeletal: No lower extremity tenderness nor edema.  No joint effusions. Neurologic:  Normal speech and language. No gross focal neurologic deficits are appreciated. No gait instability. Skin:  Skin is warm, dry and intact. No rash noted. Psychiatric: Mood and affect are normal. Speech  and behavior are normal.  ____________________________________________   LABS (all labs ordered are listed, but only abnormal results are displayed)  Labs Reviewed - No data to display ____________________________________________  EKG  Not indicated ____________________________________________  RADIOLOGY  ED MD interpretation:    Not indicated I, Sherrie George, personally viewed and evaluated these images (plain radiographs) as part of my medical decision making, as well as reviewing the written report by the radiologist.  Official radiology report(s): No results found.  ____________________________________________   PROCEDURES  Procedure(s) performed (including Critical Care):  Marland KitchenMarland KitchenLaceration Repair  Date/Time: 02/21/2021 3:33 PM Performed by: Victorino Dike, FNP Authorized by: Victorino Dike, FNP   Consent:    Consent obtained:  Verbal  Consent given by:  Patient   Risks discussed:  Poor cosmetic result and infection Universal protocol:    Patient identity confirmed:  Verbally with patient Anesthesia:    Anesthesia method:  None Laceration details:    Location:  Face   Face location:  L eyebrow   Length (cm):  1 Treatment:    Area cleansed with:  Chlorhexidine and saline   Amount of cleaning:  Standard   Irrigation method:  Tap   Debridement:  None Skin repair:    Repair method:  Tissue adhesive Approximation:    Approximation:  Close Repair type:    Repair type:  Simple Post-procedure details:    Dressing:  Open (no dressing)   Procedure completion:  Tolerated well, no immediate complications  ____________________________________________   INITIAL IMPRESSION / ASSESSMENT AND PLAN     70 year old female presenting to the emergency department for treatment and evaluation after accidentally hitting herself in the eyebrow with a crowbar yesterday evening.  See HPI for further details.   ED COURSE  Exam does show that the area does continue  to have scant amount of blood and clear drainage from the area.  Area was cleaned and closed with Dermabond as described above.  Wound care was discussed.  Patient is to follow-up with her primary care provider for any sign or concern of infection.  Tdap updated today as well.  She was encouraged to return to the emergency department for symptoms of concern if she is unable to schedule an appointment.    ___________________________________________   FINAL CLINICAL IMPRESSION(S) / ED DIAGNOSES  Final diagnoses:  Facial laceration, initial encounter     ED Discharge Orders     None        JOSIAH WOJTASZEK was evaluated in Emergency Department on 02/21/2021 for the symptoms described in the history of present illness. She was evaluated in the context of the global COVID-19 pandemic, which necessitated consideration that the patient might be at risk for infection with the SARS-CoV-2 virus that causes COVID-19. Institutional protocols and algorithms that pertain to the evaluation of patients at risk for COVID-19 are in a state of rapid change based on information released by regulatory bodies including the CDC and federal and state organizations. These policies and algorithms were followed during the patient's care in the ED.   Note:  This document was prepared using Dragon voice recognition software and may include unintentional dictation errors.    Victorino Dike, FNP 02/21/21 1535    Lavonia Drafts, MD 02/24/21 1341

## 2021-02-21 NOTE — Telephone Encounter (Signed)
Called to speak to [facility] concerning the [employer]. 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 While doing so, the crowbar slipped and hit her on the head. Pt states that she was cut along her eyebrow and that the wound "is right across and extends past her eyebrow".  Pt states that her sister was present at the time and they treated with neosporin and some non adhesive gauze. Pt then went to sleep and states that the pain and swelling has subsided. Pt states that upon this morning, she has continued to bleed and has bleed through her gauze. Pt was concerned over the bleeding and possibly needing stitches. Pt was informed that she needs to be evaluated and seen for her head wound. Instructed that for her head wound she will need a full evaluation and that she should go to the ED or urgent care. Pt states that she will go to the [facility] in [place] as she is in no pain and only has bleeding currently. Pt was informed that due to it being a head injury, they may not do stitches and would instruct her to go to the ED. Pt verbalized understanding and states that she will go to [place] urgent care now.

## 2021-02-21 NOTE — Telephone Encounter (Signed)
Patient came into the office with a bleeding cut above her left eye, it was covered. She went to urgent care and they told her she needed to go to the ED. She said all she needs is someone to look at it. Patient was told that if she needed stitches the office was unable to do and needed to go to the ED like urgent care recommended. Patient was mad and walked out of office saying, "forget it then, all I need is someone to tell me if I need stitches".

## 2021-03-19 DIAGNOSIS — Z20828 Contact with and (suspected) exposure to other viral communicable diseases: Secondary | ICD-10-CM | POA: Diagnosis not present

## 2021-03-26 ENCOUNTER — Telehealth: Payer: Self-pay | Admitting: Internal Medicine

## 2021-03-26 ENCOUNTER — Other Ambulatory Visit: Payer: Self-pay | Admitting: Internal Medicine

## 2021-03-26 NOTE — Telephone Encounter (Signed)
Pt called in to schedule her visit for prolia injection. Pt schedule day for prolia is 04/10/2021.

## 2021-03-26 NOTE — Telephone Encounter (Signed)
RX Refill: ativan Last Seen: 01-16-21 Last Ordered: 04-08-20 Next Appt: 01-16-22

## 2021-04-10 ENCOUNTER — Other Ambulatory Visit: Payer: Self-pay

## 2021-04-10 ENCOUNTER — Ambulatory Visit (INDEPENDENT_AMBULATORY_CARE_PROVIDER_SITE_OTHER): Payer: Medicare Other

## 2021-04-10 DIAGNOSIS — M810 Age-related osteoporosis without current pathological fracture: Secondary | ICD-10-CM | POA: Diagnosis not present

## 2021-04-10 MED ORDER — DENOSUMAB 60 MG/ML ~~LOC~~ SOSY
60.0000 mg | PREFILLED_SYRINGE | Freq: Once | SUBCUTANEOUS | Status: AC
  Administered 2021-04-10: 60 mg via SUBCUTANEOUS

## 2021-04-10 NOTE — Progress Notes (Signed)
Patient presented for 6-month Prolia injection SQ to right arm. Patient tolerated well. 

## 2021-04-29 DIAGNOSIS — A419 Sepsis, unspecified organism: Secondary | ICD-10-CM | POA: Diagnosis not present

## 2021-04-29 DIAGNOSIS — U071 COVID-19: Secondary | ICD-10-CM | POA: Diagnosis not present

## 2021-04-29 DIAGNOSIS — I471 Supraventricular tachycardia: Secondary | ICD-10-CM | POA: Diagnosis not present

## 2021-04-29 DIAGNOSIS — E876 Hypokalemia: Secondary | ICD-10-CM | POA: Diagnosis not present

## 2021-05-05 DIAGNOSIS — Z03818 Encounter for observation for suspected exposure to other biological agents ruled out: Secondary | ICD-10-CM | POA: Diagnosis not present

## 2021-05-05 DIAGNOSIS — U071 COVID-19: Secondary | ICD-10-CM | POA: Diagnosis not present

## 2021-05-08 ENCOUNTER — Emergency Department: Payer: Medicare Other

## 2021-05-08 ENCOUNTER — Encounter: Payer: Self-pay | Admitting: Emergency Medicine

## 2021-05-08 ENCOUNTER — Other Ambulatory Visit: Payer: Self-pay

## 2021-05-08 ENCOUNTER — Inpatient Hospital Stay
Admission: EM | Admit: 2021-05-08 | Discharge: 2021-05-15 | DRG: 871 | Disposition: A | Discharge status: Home Health | Payer: Medicare Other | Attending: Internal Medicine | Admitting: Internal Medicine

## 2021-05-08 DIAGNOSIS — J9601 Acute respiratory failure with hypoxia: Secondary | ICD-10-CM | POA: Diagnosis present

## 2021-05-08 DIAGNOSIS — H6121 Impacted cerumen, right ear: Secondary | ICD-10-CM | POA: Diagnosis present

## 2021-05-08 DIAGNOSIS — Z7982 Long term (current) use of aspirin: Secondary | ICD-10-CM | POA: Diagnosis not present

## 2021-05-08 DIAGNOSIS — R6510 Systemic inflammatory response syndrome (SIRS) of non-infectious origin without acute organ dysfunction: Secondary | ICD-10-CM

## 2021-05-08 DIAGNOSIS — A419 Sepsis, unspecified organism: Secondary | ICD-10-CM

## 2021-05-08 DIAGNOSIS — F17210 Nicotine dependence, cigarettes, uncomplicated: Secondary | ICD-10-CM | POA: Diagnosis present

## 2021-05-08 DIAGNOSIS — E876 Hypokalemia: Secondary | ICD-10-CM | POA: Diagnosis not present

## 2021-05-08 DIAGNOSIS — R52 Pain, unspecified: Secondary | ICD-10-CM | POA: Diagnosis not present

## 2021-05-08 DIAGNOSIS — M810 Age-related osteoporosis without current pathological fracture: Secondary | ICD-10-CM | POA: Diagnosis not present

## 2021-05-08 DIAGNOSIS — F32A Depression, unspecified: Secondary | ICD-10-CM

## 2021-05-08 DIAGNOSIS — E785 Hyperlipidemia, unspecified: Secondary | ICD-10-CM | POA: Diagnosis not present

## 2021-05-08 DIAGNOSIS — U071 COVID-19: Secondary | ICD-10-CM

## 2021-05-08 DIAGNOSIS — R6520 Severe sepsis without septic shock: Secondary | ICD-10-CM | POA: Diagnosis not present

## 2021-05-08 DIAGNOSIS — T380X5A Adverse effect of glucocorticoids and synthetic analogues, initial encounter: Secondary | ICD-10-CM | POA: Diagnosis not present

## 2021-05-08 DIAGNOSIS — Z9049 Acquired absence of other specified parts of digestive tract: Secondary | ICD-10-CM | POA: Diagnosis not present

## 2021-05-08 DIAGNOSIS — J439 Emphysema, unspecified: Secondary | ICD-10-CM | POA: Diagnosis not present

## 2021-05-08 DIAGNOSIS — R918 Other nonspecific abnormal finding of lung field: Secondary | ICD-10-CM | POA: Diagnosis not present

## 2021-05-08 DIAGNOSIS — J1282 Pneumonia due to coronavirus disease 2019: Secondary | ICD-10-CM | POA: Diagnosis not present

## 2021-05-08 DIAGNOSIS — R0689 Other abnormalities of breathing: Secondary | ICD-10-CM | POA: Diagnosis not present

## 2021-05-08 DIAGNOSIS — H6692 Otitis media, unspecified, left ear: Secondary | ICD-10-CM | POA: Diagnosis not present

## 2021-05-08 DIAGNOSIS — A413 Sepsis due to Hemophilus influenzae: Secondary | ICD-10-CM | POA: Diagnosis not present

## 2021-05-08 DIAGNOSIS — E860 Dehydration: Secondary | ICD-10-CM | POA: Diagnosis not present

## 2021-05-08 DIAGNOSIS — F411 Generalized anxiety disorder: Secondary | ICD-10-CM | POA: Diagnosis present

## 2021-05-08 DIAGNOSIS — Z66 Do not resuscitate: Secondary | ICD-10-CM | POA: Diagnosis present

## 2021-05-08 DIAGNOSIS — F5105 Insomnia due to other mental disorder: Secondary | ICD-10-CM | POA: Diagnosis not present

## 2021-05-08 DIAGNOSIS — Z720 Tobacco use: Secondary | ICD-10-CM | POA: Diagnosis present

## 2021-05-08 DIAGNOSIS — D75839 Thrombocytosis, unspecified: Secondary | ICD-10-CM | POA: Diagnosis not present

## 2021-05-08 DIAGNOSIS — R0902 Hypoxemia: Secondary | ICD-10-CM | POA: Diagnosis not present

## 2021-05-08 DIAGNOSIS — R000 Tachycardia, unspecified: Secondary | ICD-10-CM | POA: Diagnosis not present

## 2021-05-08 DIAGNOSIS — J189 Pneumonia, unspecified organism: Secondary | ICD-10-CM

## 2021-05-08 DIAGNOSIS — F419 Anxiety disorder, unspecified: Secondary | ICD-10-CM | POA: Diagnosis not present

## 2021-05-08 DIAGNOSIS — I471 Supraventricular tachycardia: Secondary | ICD-10-CM

## 2021-05-08 DIAGNOSIS — D72829 Elevated white blood cell count, unspecified: Secondary | ICD-10-CM

## 2021-05-08 DIAGNOSIS — R7881 Bacteremia: Secondary | ICD-10-CM | POA: Diagnosis not present

## 2021-05-08 DIAGNOSIS — R0602 Shortness of breath: Secondary | ICD-10-CM | POA: Diagnosis not present

## 2021-05-08 DIAGNOSIS — Z79899 Other long term (current) drug therapy: Secondary | ICD-10-CM | POA: Diagnosis not present

## 2021-05-08 DIAGNOSIS — J9621 Acute and chronic respiratory failure with hypoxia: Secondary | ICD-10-CM | POA: Diagnosis not present

## 2021-05-08 HISTORY — DX: Sepsis, unspecified organism: A41.9

## 2021-05-08 HISTORY — DX: COVID-19: U07.1

## 2021-05-08 HISTORY — DX: Pneumonia, unspecified organism: J18.9

## 2021-05-08 LAB — CBC WITH DIFFERENTIAL/PLATELET
Abs Immature Granulocytes: 0.05 10*3/uL (ref 0.00–0.07)
Basophils Absolute: 0 10*3/uL (ref 0.0–0.1)
Basophils Relative: 0 %
Eosinophils Absolute: 0 10*3/uL (ref 0.0–0.5)
Eosinophils Relative: 0 %
HCT: 37.1 % (ref 36.0–46.0)
Hemoglobin: 11.9 g/dL — ABNORMAL LOW (ref 12.0–15.0)
Immature Granulocytes: 0 %
Lymphocytes Relative: 4 %
Lymphs Abs: 0.5 10*3/uL — ABNORMAL LOW (ref 0.7–4.0)
MCH: 30.5 pg (ref 26.0–34.0)
MCHC: 32.1 g/dL (ref 30.0–36.0)
MCV: 95.1 fL (ref 80.0–100.0)
Monocytes Absolute: 0.8 10*3/uL (ref 0.1–1.0)
Monocytes Relative: 6 %
Neutro Abs: 12.4 10*3/uL — ABNORMAL HIGH (ref 1.7–7.7)
Neutrophils Relative %: 90 %
Platelets: 270 10*3/uL (ref 150–400)
RBC: 3.9 MIL/uL (ref 3.87–5.11)
RDW: 13.9 % (ref 11.5–15.5)
Smear Review: NORMAL
WBC: 13.9 10*3/uL — ABNORMAL HIGH (ref 4.0–10.5)
nRBC: 0 % (ref 0.0–0.2)

## 2021-05-08 LAB — BASIC METABOLIC PANEL
Anion gap: 7 (ref 5–15)
BUN: 16 mg/dL (ref 8–23)
CO2: 29 mmol/L (ref 22–32)
Calcium: 8.1 mg/dL — ABNORMAL LOW (ref 8.9–10.3)
Chloride: 105 mmol/L (ref 98–111)
Creatinine, Ser: 0.66 mg/dL (ref 0.44–1.00)
GFR, Estimated: >60 mL/min (ref >60)
Glucose, Bld: 146 mg/dL — ABNORMAL HIGH (ref 70–99)
Potassium: 3.2 mmol/L — ABNORMAL LOW (ref 3.5–5.1)
Sodium: 141 mmol/L (ref 135–145)

## 2021-05-08 LAB — URINALYSIS, COMPLETE (UACMP) WITH MICROSCOPIC
Bilirubin Urine: NEGATIVE
Glucose, UA: 50 mg/dL — AB
Ketones, ur: NEGATIVE mg/dL
Leukocytes,Ua: NEGATIVE
Nitrite: NEGATIVE
Protein, ur: 100 mg/dL — AB
Specific Gravity, Urine: 1.02 (ref 1.005–1.030)
pH: 6 (ref 5.0–8.0)

## 2021-05-08 LAB — MAGNESIUM: Magnesium: 2.1 mg/dL (ref 1.7–2.4)

## 2021-05-08 LAB — LACTIC ACID, PLASMA
Lactic Acid, Venous: 1.2 mmol/L (ref 0.5–1.9)
Lactic Acid, Venous: 1.3 mmol/L (ref 0.5–1.9)

## 2021-05-08 LAB — PROCALCITONIN: Procalcitonin: 0.22 ng/mL

## 2021-05-08 LAB — TSH: TSH: 0.537 u[IU]/mL (ref 0.350–4.500)

## 2021-05-08 MED ORDER — AEROCHAMBER PLUS FLO-VU LARGE MISC
1.0000 | Status: DC | PRN
  Filled 2021-05-08: qty 1

## 2021-05-08 MED ORDER — SODIUM CHLORIDE 0.9 % IV SOLN
2.0000 g | INTRAVENOUS | Status: DC
  Administered 2021-05-09 – 2021-05-12 (×4): 2 g via INTRAVENOUS
  Filled 2021-05-08 (×2): qty 20
  Filled 2021-05-08 (×2): qty 2

## 2021-05-08 MED ORDER — ONDANSETRON HCL 4 MG/2ML IJ SOLN: 4.0000 mg | Freq: Four times a day (QID) | INTRAMUSCULAR | Status: DC | PRN

## 2021-05-08 MED ORDER — SODIUM CHLORIDE 0.9 % IV SOLN
500.0000 mg | Freq: Once | INTRAVENOUS | Status: AC
  Administered 2021-05-08: 16:00:00 500 mg via INTRAVENOUS
  Filled 2021-05-08: qty 5

## 2021-05-08 MED ORDER — ACETAMINOPHEN 500 MG PO TABS
1000.0000 mg | ORAL_TABLET | Freq: Four times a day (QID) | ORAL | Status: DC | PRN
  Administered 2021-05-08 – 2021-05-14 (×5): 1000 mg via ORAL
  Filled 2021-05-08 (×5): qty 2

## 2021-05-08 MED ORDER — SODIUM CHLORIDE 0.9 % IV SOLN
1.0000 g | Freq: Once | INTRAVENOUS | Status: AC
  Administered 2021-05-08: 16:00:00 1 g via INTRAVENOUS
  Filled 2021-05-08: qty 10

## 2021-05-08 MED ORDER — IOHEXOL 350 MG/ML SOLN
75.0000 mL | Freq: Once | INTRAVENOUS | Status: AC | PRN
  Administered 2021-05-08: 17:00:00 75 mL via INTRAVENOUS

## 2021-05-08 MED ORDER — ALBUTEROL SULFATE HFA 108 (90 BASE) MCG/ACT IN AERS
2.0000 | INHALATION_SPRAY | RESPIRATORY_TRACT | Status: DC | PRN
  Administered 2021-05-09 – 2021-05-11 (×4): 2 via RESPIRATORY_TRACT
  Filled 2021-05-08 (×2): qty 6.7

## 2021-05-08 MED ORDER — ENOXAPARIN SODIUM 30 MG/0.3ML IJ SOSY
30.0000 mg | PREFILLED_SYRINGE | INTRAMUSCULAR | Status: DC
  Administered 2021-05-09 – 2021-05-15 (×7): 30 mg via SUBCUTANEOUS
  Filled 2021-05-08 (×7): qty 0.3

## 2021-05-08 MED ORDER — ATORVASTATIN CALCIUM 20 MG PO TABS
20.0000 mg | ORAL_TABLET | Freq: Every day | ORAL | Status: DC
  Administered 2021-05-09 – 2021-05-15 (×7): 20 mg via ORAL
  Filled 2021-05-08 (×7): qty 1

## 2021-05-08 MED ORDER — LACTATED RINGERS IV SOLN: INTRAVENOUS | Status: DC

## 2021-05-08 MED ORDER — AZITHROMYCIN 500 MG PO TABS
500.0000 mg | ORAL_TABLET | Freq: Every day | ORAL | Status: AC
  Administered 2021-05-09 – 2021-05-12 (×4): 500 mg via ORAL
  Filled 2021-05-08 (×4): qty 1

## 2021-05-08 MED ORDER — POTASSIUM CHLORIDE CRYS ER 20 MEQ PO TBCR
40.0000 meq | EXTENDED_RELEASE_TABLET | Freq: Once | ORAL | Status: AC
  Administered 2021-05-08: 15:00:00 40 meq via ORAL
  Filled 2021-05-08: qty 2

## 2021-05-08 MED ORDER — PAROXETINE HCL 10 MG PO TABS
10.0000 mg | ORAL_TABLET | Freq: Every day | ORAL | Status: DC
  Administered 2021-05-09 – 2021-05-15 (×7): 10 mg via ORAL
  Filled 2021-05-08 (×7): qty 1

## 2021-05-08 MED ORDER — ASPIRIN EC 81 MG PO TBEC
81.0000 mg | DELAYED_RELEASE_TABLET | Freq: Every day | ORAL | Status: DC
  Administered 2021-05-09 – 2021-05-15 (×8): 81 mg via ORAL
  Filled 2021-05-08 (×7): qty 1

## 2021-05-08 MED ORDER — LORAZEPAM 1 MG PO TABS
1.0000 mg | ORAL_TABLET | Freq: Two times a day (BID) | ORAL | Status: DC
  Administered 2021-05-09 (×2): 1 mg via ORAL
  Filled 2021-05-08 (×2): qty 1

## 2021-05-08 MED ORDER — SODIUM CHLORIDE 0.9 % IV SOLN
200.0000 mg | Freq: Once | INTRAVENOUS | Status: AC
  Administered 2021-05-08: 18:00:00 200 mg via INTRAVENOUS
  Filled 2021-05-08: qty 40

## 2021-05-08 MED ORDER — SODIUM CHLORIDE 0.9 % IV SOLN
100.0000 mg | Freq: Every day | INTRAVENOUS | Status: AC
  Administered 2021-05-09 – 2021-05-12 (×4): 100 mg via INTRAVENOUS
  Filled 2021-05-08 (×2): qty 20
  Filled 2021-05-08 (×2): qty 100

## 2021-05-08 MED ORDER — DEXAMETHASONE 4 MG PO TABS
6.0000 mg | ORAL_TABLET | Freq: Every day | ORAL | Status: DC
Start: 2021-05-09 — End: 2021-05-14
  Administered 2021-05-09 – 2021-05-14 (×6): 6 mg via ORAL
  Filled 2021-05-08 (×2): qty 2
  Filled 2021-05-08: qty 1
  Filled 2021-05-08: qty 2
  Filled 2021-05-08: qty 1
  Filled 2021-05-08 (×2): qty 2

## 2021-05-08 MED ORDER — DEXAMETHASONE SODIUM PHOSPHATE 10 MG/ML IJ SOLN
10.0000 mg | Freq: Once | INTRAMUSCULAR | Status: AC
  Administered 2021-05-08: 15:00:00 10 mg via INTRAVENOUS
  Filled 2021-05-08: qty 1

## 2021-05-08 MED ORDER — LACTATED RINGERS IV BOLUS
1000.0000 mL | Freq: Once | INTRAVENOUS | Status: AC
  Administered 2021-05-08: 14:00:00 1000 mL via INTRAVENOUS

## 2021-05-08 MED ORDER — SODIUM CHLORIDE 0.45 % IV SOLN: INTRAVENOUS | Status: DC

## 2021-05-08 NOTE — H&P (Signed)
History and Physical    Kimberly Schmitt NWG:[id number] DOB: 02/09/1951 DOA: 05/08/2021  PCP: Crecencio Mc, MD (Confirm with patient/family/NH records and if not entered, this has to be entered at [facility] point of entry) Patient coming from: home  I have personally briefly reviewed patient's old medical records in [place]  Chief Complaint: increasing SOB, myalgias, cough  HPI: Kimberly Schmitt is a 70 y.o. female with medical history significant of tobacco abuse, anxiety as well as recent diagnosis of COVID on 12/26 who presents EMS from home for assessment of shortness of breath.  Patient states she has been feeling poorly for about a week with body aches, cough, shortness of breath much worse with minimal exertion, some chest tightness with exertion and some right mid back pain that started today.  She has had a mild cough as well but no hemoptysis.  She endorses nausea and some nonbloody diarrhea but denies any vomiting or urinary symptoms.  She denies any current chest pain, abdominal pain, rash, extremity pain, headache earache, sore throat or other clear associated sick symptoms.        Per EMS on arrival patient's heart rate was in the 180s to 190s and twelve-lead ECG showed evidence of SVT.  Patient was given 6 mg of adenosine and following this was noted to have a heart rate in the 140s and on arrival to emergency room at 125.  Patient also given 500 cc of normal saline.  She was also noted to be hypoxic with EMS at 85% on room air.  She was transferred on supplemental oxygen and placed on 2 L on arrival with a room air SPO2 of 88%.  Note by EDP with review and agreement by admitting MD.   ED Course: T 99.7  127/88  HR 122max to 100, RR 20. Lab with K 3.2, Glucose 146, Procalcitonin 0.22, WBC 13.9 with 90/4/6. UA negative. CTA no PE, patchy nodular infiltrates c/w covid PNA, chronic emphysema. Patient meets criteria for sepsis with fever, tachypnea, tachycardia,  leukocytosis  Review of Systems: As per HPI otherwise 10 point review of systems negative.    Past Medical History:  Diagnosis Date   Family history of breast cancer    Family history of colon cancer    Family history of pancreatic cancer    Hyperlipidemia    220 per patient   Personal history of tobacco use, presenting hazards to health 06/12/2015   Visit for preventive health examination 04/17/2013   .     Past Surgical History:  Procedure Laterality Date   APPENDECTOMY  1968   COLONOSCOPY     COLONOSCOPY     COLONOSCOPY WITH PROPOFOL N/A 09/20/2020   Procedure: COLONOSCOPY WITH PROPOFOL;  Surgeon: Robert Bellow, MD;  Location: [facility] ENDOSCOPY;  Service: Endoscopy;  Laterality: N/A;    Soc Hx - widowed 2.5 years ago after 57 years of marriage. One son, one grandson, one grand-daughter, 2 great-grandsons. 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 LIves alone with her [place]   reports that she has been smoking cigarettes. She has a 22.50 pack-year smoking history. She has never used smokeless tobacco. She reports that she does not drink alcohol and does not use drugs.  No Known Allergies  Family History  Problem Relation Age of Onset   Cancer Brother 74       pancreatic   Breast cancer Maternal Grandmother 69   Cancer Father 65       colon  Diabetes Sister    Stroke Maternal Aunt    Cancer Paternal Uncle        unk type   Colon polyps Brother      Prior to Admission medications   Medication Sig Start Date End Date Taking? Authorizing Provider  aspirin 81 MG tablet Take 81 mg by mouth daily.    [provider]  atorvastatin (LIPITOR) 20 MG tablet TAKE 1 TABLET (20 MG TOTAL) BY MOUTH DAILY. 04/18/20   Crecencio Mc, MD  Calcium Carbonate-Vit D-Min (CALCIUM 1200 PO) Take 1 capsule by mouth daily.    [provider]  cholecalciferol (VITAMIN D) 1000 units tablet Take 1,000 Units by mouth daily.    [provider]  doxycycline  (VIBRA-TABS) 100 MG tablet Take 1 tablet (100 mg total) by mouth 2 (two) times daily. Patient not taking: Reported on 05/08/2021 01/16/21   Crecencio Mc, MD  LACTOBACILLUS PROBIOTIC PO Take 1 capsule by mouth daily.    [provider]  LORazepam (ATIVAN) 1 MG tablet TAKE 1 TABLET (1 MG TOTAL) BY MOUTH 2 (TWO) TIMES DAILY. AS NEEDED FOR INSOMNIA AND ANXIETY 03/26/21   Crecencio Mc, MD  PARoxetine (PAXIL) 10 MG tablet TAKE 1 TABLET (10 MG TOTAL) BY MOUTH DAILY. AFTER DINNER 04/16/20   Crecencio Mc, MD    Physical Exam: Vitals:   05/08/21 1730 05/08/21 1800 05/08/21 1900 05/08/21 1930  BP: 114/70 116/66 130/62 127/88  Pulse: 90 84 94 100  Resp: (!) 22 (!) 22 (!) 34 20  Temp:      TempSrc:      SpO2: 94% 93% 95% 94%  Weight:      Height:         Vitals:   05/08/21 1730 05/08/21 1800 05/08/21 1900 05/08/21 1930  BP: 114/70 116/66 130/62 127/88  Pulse: 90 84 94 100  Resp: (!) 22 (!) 22 (!) 34 20  Temp:      TempSrc:      SpO2: 94% 93% 95% 94%  Weight:      Height:       General: slender woman in no acute distress Eyes: PERRL, lids and conjunctivae normal ENMT: Mucous membranes are moist. Posterior pharynx clear of any exudate or lesions.Normal dentition.  Neck: normal, supple, no masses, no thyromegaly Respiratory: decreased Breath sounds, prolonged expiratory phase, no wheezing, no crackles. Normal respiratory effort. No accessory muscle use.  Cardiovascular: Regular rate and rhythm, no murmurs / rubs / gallops. No extremity edema. 2+ pedal pulses. No carotid bruits.  Abdomen: no tenderness, no masses palpated. No hepatosplenomegaly. Bowel sounds positive.  Musculoskeletal: no clubbing / cyanosis. No joint deformity upper and lower extremities. Good ROM, no contractures. Normal muscle tone.  Skin: no rashes, lesions, ulcers. No induration Neurologic: CN 2-12 grossly intact. Strength 5/5 in all 4.  Psychiatric: Normal judgment and insight. Alert and oriented x 3.  Normal mood.     Labs on Admission: I have personally reviewed following labs and imaging studies  CBC: Recent Labs  Lab 05/08/21 1329  WBC 13.9*  NEUTROABS 12.4*  HGB 11.9*  HCT 37.1  MCV 95.1  PLT 250   Basic Metabolic Panel: Recent Labs  Lab 05/08/21 1329  NA 141  K 3.2*  CL 105  CO2 29  GLUCOSE 146*  BUN 16  CREATININE 0.66  CALCIUM 8.1*  MG 2.1   GFR: Estimated Creatinine Clearance: 42.6 mL/min (by C-G formula based on SCr of 0.66 mg/dL). Liver Function  Tests: No results for input(s): AST, ALT, ALKPHOS, BILITOT, PROT, ALBUMIN in the last 168 hours. No results for input(s): LIPASE, AMYLASE in the last 168 hours. No results for input(s): AMMONIA in the last 168 hours. Coagulation Profile: No results for input(s): INR, PROTIME in the last 168 hours. Cardiac Enzymes: No results for input(s): CKTOTAL, CKMB, CKMBINDEX, TROPONINI in the last 168 hours. BNP (last 3 results) No results for input(s): PROBNP in the last 8760 hours. HbA1C: No results for input(s): HGBA1C in the last 72 hours. CBG: No results for input(s): GLUCAP in the last 168 hours. Lipid Profile: No results for input(s): CHOL, HDL, LDLCALC, TRIG, CHOLHDL, LDLDIRECT in the last 72 hours. Thyroid Function Tests: Recent Labs    05/08/21 1329  TSH 0.537   Anemia Panel: No results for input(s): VITAMINB12, FOLATE, FERRITIN, TIBC, IRON, RETICCTPCT in the last 72 hours. Urine analysis:    Component Value Date/Time   COLORURINE YELLOW (A) 05/08/2021 1357   APPEARANCEUR HAZY (A) 05/08/2021 1357   LABSPEC 1.020 05/08/2021 1357   PHURINE 6.0 05/08/2021 1357   GLUCOSEU 50 (A) 05/08/2021 1357   GLUCOSEU NEGATIVE 10/21/2012 1356   HGBUR SMALL (A) 05/08/2021 1357   BILIRUBINUR NEGATIVE 05/08/2021 1357   KETONESUR NEGATIVE 05/08/2021 1357   PROTEINUR 100 (A) 05/08/2021 1357   UROBILINOGEN 0.2 10/21/2012 1356   NITRITE NEGATIVE 05/08/2021 1357   [place] 05/08/2021 1357     Radiological Exams on Admission: CT Angio Chest PE W and/or Wo Contrast  Result Date: 05/08/2021 CLINICAL DATA:  Pulmonary embolus suspected with high probability. Recent diagnosis of COVID. Shortness of breath. EXAM: CT ANGIOGRAPHY CHEST WITH CONTRAST TECHNIQUE: Multidetector CT imaging of the chest was performed using the standard protocol during bolus administration of intravenous contrast. Multiplanar CT image reconstructions and MIPs were obtained to evaluate the vascular anatomy. CONTRAST:  47mL OMNIPAQUE IOHEXOL 350 MG/ML SOLN COMPARISON:  None 07/29/2019 FINDINGS: Cardiovascular: Good opacification of the central and segmental pulmonary arteries. No focal filling defects. No evidence of significant pulmonary embolus. Normal heart size. No pericardial effusions. Normal caliber thoracic aorta. No aortic dissection. Great vessel origins are patent. Coronary artery and aortic calcifications. Mediastinum/Nodes: Esophagus is decompressed. No significant lymphadenopathy. Thyroid gland is unremarkable. Lungs/Pleura: Emphysematous changes in the lungs. Bronchial wall thickening consistent with chronic bronchitis. Patchy nodular airspace disease most prominent in the lung bases bilaterally. This is likely multifocal pneumonia. No pleural effusions. No pneumothorax. Upper Abdomen: No acute abnormality. Musculoskeletal: Degenerative changes in the spine. No destructive bone lesions. Review of the MIP images confirms the above findings. IMPRESSION: 1. No evidence of significant pulmonary embolus. 2. Patchy nodular infiltrates in the lung bases, likely multifocal pneumonia and consistent with COVID pneumonia. 3. Chronic emphysematous and bronchitic changes in the lungs. Electronically Signed   By: Lucienne Capers M.D.   On: 05/08/2021 17:14   DG Chest Port 1 View  Result Date: 05/08/2021 CLINICAL DATA:  Shortness of breath.  COVID positive. EXAM: PORTABLE CHEST 1 VIEW COMPARISON:  CT chest dated February 01, 2020. FINDINGS: The heart size and mediastinal contours are within normal limits. The lungs are hyperinflated with emphysematous changes. Minimal patchy opacity at the left lung base. No focal consolidation, pleural effusion, or pneumothorax. No acute osseous abnormality. IMPRESSION: 1. Minimal left basilar atelectasis versus infiltrate. Electronically Signed   By: Titus Dubin M.D.   On: 05/08/2021 14:11    EKG: Independently reviewed. Sinus tachycardia w/o acute changes  Assessment/Plan Principal Problem:   CAP (community acquired  pneumonia) Active Problems:   COVID-19 virus infection   Sepsis ([place])   GAD (generalized anxiety disorder)   Tobacco abuse   Hyperlipidemia    Sepsis - patients presents with hypoxemia, tachycardia, tachypnea, leukocytosis. Code sepsis initiated. Patient received 1 liter LR bolus. Abx, antiviral, steroids administered in ED.  2.  CAP - patient with leukocytosis w/ left shift, patchy nodular infiltrates, minimally elevated procalcitonin Plan Continue Rocephin 2g IV q 24  Azithromycin 500 mg daily  O2 to keep sat > 92%  IS hourly while awake  F/u CBC  3. Covid 19 infection and possible PNA - patient did receive Shot #1,2. NO boosters Plan Remdesivir daily x 5 ` Decadron 6 mg daily x 5  F/u procalcitonin, CBC, Cmet  O2  4. Tobacco abuse - patient still smokes. Reviewed mechanism of emphysema which she has by CT criteria. Encouraged smoking cessation. Plan Albuterol MDI with aerochamber prn  5. GAD - continue home meds  6. HLD - continue home meds.   DVT prophylaxis: lovenox  Code Status: full code  Family Communication: grand-daughter present during interview and exam. Understands dx and tx. Answered all questions  Disposition Plan: home when medically stable  Consults called: none  Admission status: inpatient   Adella Hare MD [facility] Pager [phone]  If 7PM-7AM, please contact night-coverage [email or web address] Password  [facility]  05/08/2021, 8:13 PM

## 2021-05-08 NOTE — Sepsis Progress Note (Signed)
Code Sepsis protocol being monitored by eLink. 

## 2021-05-08 NOTE — ED Triage Notes (Signed)
EMS called to home for C/O SOB.  Initial O2 sat 85% on RA with initial pulse 180-195.  Placed on 12-lead-- SVT.  6 mg adenosine given, converted to 140.  Current pulse 125.  No known history of same.  500 cc NS given.  18 g LAC.  COVID+ on Monday.

## 2021-05-08 NOTE — Consult Note (Signed)
Remdesivir - Pharmacy Brief Note   O:  ALT: 14 on 01/2021 (baseline) CXR: patchy opacity at the left lung base SpO2: 97% on 2L    A/P:  Remdesivir 200 mg IVPB once followed by 100 mg IVPB daily x 4 days.   Will order ALT/AST for tomorrow as well  Rayven Hendrickson Rodriguez-Guzman PharmD, BCPS 05/08/2021 3:38 PM

## 2021-05-08 NOTE — Sepsis Progress Note (Signed)
Notified bedside nurse of need to draw repeat lactic acid. The first Lactic acid appears to have been drawn at 1350 and has not resulted. Baxter Flattery RN was contacted and will draw a 2nd lactic acid now.

## 2021-05-08 NOTE — Consult Note (Signed)
CODE SEPSIS - PHARMACY COMMUNICATION  **Broad Spectrum Antibiotics should be administered within 1 hour of Sepsis diagnosis**  Time Code Sepsis Called/Page Received: no page or call *questionable sepsis consult entered 1348  Antibiotics Ordered: Ceftriaxone ordered at 1530  Time of 1st antibiotic administration: 1541  Additional action taken by pharmacy: none  If necessary, Name of Provider/Nurse Contacted: n/a   Meli Faley Rodriguez-Guzman PharmD, BCPS 05/08/2021 2:17 PM

## 2021-05-08 NOTE — ED Provider Notes (Signed)
[facility] Emergency Department Provider Note  ____________________________________________   Event Date/Time   First MD Initiated Contact with Patient 05/08/21 1323     (approximate)  I have reviewed the triage vital signs and the nursing notes.   HISTORY  Chief Complaint Chest Pain   HPI Kimberly Schmitt is a 70 y.o. female with below noted past medical history as well as recent diagnosis of COVID on 12/26 who presents EMS from home for assessment of shortness of breath.  Patient states she has been feeling poorly for about a week with body aches, cough, shortness of breath much worse with minimal exertion, some chest tightness with exertion and some right mid back pain that started today.  She has had a mild cough as well but no hemoptysis.  She endorses nausea and some nonbloody diarrhea but denies any vomiting or urinary symptoms.  She denies any current chest pain, abdominal pain, rash, extremity pain, headache earache, sore throat or other clear associated sick symptoms.     Per EMS on arrival patient's heart rate was in the 180s to 190s and twelve-lead ECG showed evidence of SVT.  Patient was given 6 mg of adenosine and following this was noted to have a heart rate in the 140s and on arrival to emergency room at 125.  Patient also given 500 cc of normal saline.  She was also noted to be hypoxic with EMS at 85% on room air.  She was transferred on supplemental oxygen and placed on 2 L on arrival with a room air SPO2 of 88%.           Past Medical History:  Diagnosis Date   Family history of breast cancer    Family history of colon cancer    Family history of pancreatic cancer    Hyperlipidemia    220 per patient   Personal history of tobacco use, presenting hazards to health 06/12/2015   Visit for preventive health examination 04/17/2013   .     Patient Active Problem List   Diagnosis Date Noted   Low back pain 01/18/2021   Aortic  atherosclerosis ([place]) 01/18/2021   Genetic testing 10/10/2020   Family history of pancreatic cancer    Family history of colon cancer    Family history of breast cancer    Urticaria 01/16/2020   Osteoporosis 07/29/2019   GAD (generalized anxiety disorder) 02/20/2018   Insomnia secondary to anxiety 12/18/2017   Atherosclerosis of native coronary artery of native heart without angina pectoris 05/16/2016   Solitary pulmonary nodule 05/16/2016   Vitamin D deficiency 05/16/2016   Hyperlipidemia 04/21/2013   Screening for colon cancer 04/08/2011   Encounter for preventive health examination 04/08/2011   Screening for cervical cancer 04/08/2011   Tobacco abuse 04/07/2011   Tobacco abuse counseling 04/07/2011    Past Surgical History:  Procedure Laterality Date   APPENDECTOMY  1968   COLONOSCOPY     COLONOSCOPY     COLONOSCOPY WITH PROPOFOL N/A 09/20/2020   Procedure: COLONOSCOPY WITH PROPOFOL;  Surgeon: Robert Bellow, MD;  Location: [facility] ENDOSCOPY;  Service: Endoscopy;  Laterality: N/A;    Prior to Admission medications   Medication Sig Start Date End Date Taking? Authorizing Provider  aspirin 81 MG tablet Take 81 mg by mouth daily.    [provider]  atorvastatin (LIPITOR) 20 MG tablet TAKE 1 TABLET (20 MG TOTAL) BY MOUTH DAILY. 04/18/20   Crecencio Mc, MD  Calcium Carbonate-Vit D-Min (CALCIUM  1200 PO) Take 1 capsule by mouth daily.    [provider]  cholecalciferol (VITAMIN D) 1000 units tablet Take 1,000 Units by mouth daily.    [provider]  doxycycline (VIBRA-TABS) 100 MG tablet Take 1 tablet (100 mg total) by mouth 2 (two) times daily. 01/16/21   Crecencio Mc, MD  LACTOBACILLUS PROBIOTIC PO Take 1 capsule by mouth daily.    [provider]  LORazepam (ATIVAN) 1 MG tablet TAKE 1 TABLET (1 MG TOTAL) BY MOUTH 2 (TWO) TIMES DAILY. AS NEEDED FOR INSOMNIA AND ANXIETY 03/26/21   Crecencio Mc, MD  PARoxetine (PAXIL) 10 MG tablet TAKE 1  TABLET (10 MG TOTAL) BY MOUTH DAILY. AFTER DINNER 04/16/20   Crecencio Mc, MD    Allergies Patient has no known allergies.  Family History  Problem Relation Age of Onset   Cancer Brother 72       pancreatic   Breast cancer Maternal Grandmother 82   Cancer Father 22       colon   Diabetes Sister    Stroke Maternal Aunt    Cancer Paternal Uncle        unk type   Colon polyps Brother     Social History Social History   Tobacco Use   Smoking status: Every Day    Packs/day: 0.50    Years: 45.00    Pack years: 22.50    Types: Cigarettes   Smokeless tobacco: Never  Substance Use Topics   Alcohol use: No    Alcohol/week: 0.0 standard drinks   Drug use: No    Review of Systems  Review of Systems  Constitutional:  Positive for malaise/fatigue. Negative for chills and fever.  HENT:  Negative for sore throat.   Eyes:  Negative for pain.  Respiratory:  Positive for cough and shortness of breath. Negative for stridor.   Cardiovascular:  Positive for chest pain (w/ exertion over past week, not at present).  Gastrointestinal:  Positive for diarrhea and nausea. Negative for vomiting.  Genitourinary:  Negative for dysuria.  Musculoskeletal:  Positive for back pain (mid R side) and myalgias.  Skin:  Negative for rash.  Neurological:  Positive for weakness. Negative for seizures, loss of consciousness and headaches.  Psychiatric/Behavioral:  Negative for suicidal ideas.   All other systems reviewed and are negative.    ____________________________________________   PHYSICAL EXAM:  VITAL SIGNS: ED Triage Vitals  Enc Vitals Group     BP      Pulse      Resp      Temp      Temp src      SpO2      Weight      Height      Head Circumference      Peak Flow      Pain Score      Pain Loc      Pain Edu?      Excl. in [place]?    Vitals:   05/08/21 1330 05/08/21 1400  BP: (!) 145/65 (!) 113/96  Pulse: (!) 120 (!) 120  Resp: (!) 21 (!) 22  Temp: 99.7 F (37.6 C)    SpO2: (!) 88% 97%   Physical Exam Vitals and nursing note reviewed.  Constitutional:      General: She is in acute distress.     Appearance: She is well-developed. She is ill-appearing.  HENT:     Head: Normocephalic and atraumatic.  Right Ear: External ear normal.     Left Ear: External ear normal.     Nose: Nose normal.     Mouth/Throat:     Mouth: Mucous membranes are dry.  Eyes:     Conjunctiva/sclera: Conjunctivae normal.  Cardiovascular:     Rate and Rhythm: Regular rhythm. Tachycardia present.     Heart sounds: No murmur heard. Pulmonary:     Effort: Pulmonary effort is normal. No respiratory distress.     Breath sounds: Normal breath sounds.  Abdominal:     Palpations: Abdomen is soft.     Tenderness: There is no abdominal tenderness.  Musculoskeletal:        General: No swelling.     Cervical back: Neck supple.     Right lower leg: No edema.     Left lower leg: No edema.  Skin:    General: Skin is warm and dry.     Capillary Refill: Capillary refill takes more than 3 seconds.  Neurological:     Mental Status: She is alert and oriented to person, place, and time.  Psychiatric:        Mood and Affect: Mood normal.    Back is unremarkable. ____________________________________________   LABS (all labs ordered are listed, but only abnormal results are displayed)  Labs Reviewed  CBC WITH DIFFERENTIAL/PLATELET - Abnormal; Notable for the following components:      Result Value   WBC 13.9 (*)    Hemoglobin 11.9 (*)    Neutro Abs 12.4 (*)    Lymphs Abs 0.5 (*)    All other components within normal limits  BASIC METABOLIC PANEL - Abnormal; Notable for the following components:   Potassium 3.2 (*)    Glucose, Bld 146 (*)    Calcium 8.1 (*)    All other components within normal limits  URINALYSIS, COMPLETE (UACMP) WITH MICROSCOPIC - Abnormal; Notable for the following components:   Color, Urine YELLOW (*)    APPearance HAZY (*)    Glucose, UA 50 (*)     Hgb urine dipstick SMALL (*)    Protein, ur 100 (*)    Bacteria, UA RARE (*)    All other components within normal limits  CULTURE, BLOOD (ROUTINE X 2)  CULTURE, BLOOD (ROUTINE X 2)  MAGNESIUM  TSH  PROCALCITONIN  LACTIC ACID, PLASMA  LACTIC ACID, PLASMA  PROTIME-INR  APTT   ____________________________________________  EKG  Sinus tachycardia with a ventricular rate of 121, without clear evidence of acute ischemia or significant arrhythmia.  Normal axis and unremarkable intervals. ____________________________________________  RADIOLOGY  ED MD interpretation: Chest x-ray shows no pneumothorax, large effusion and minimal left atelectasis versus early infiltrate.  No other clear focal consolidation or other clear acute thoracic process.   Official radiology report(s): DG Chest Port 1 View  Result Date: 05/08/2021 CLINICAL DATA:  Shortness of breath.  COVID positive. EXAM: PORTABLE CHEST 1 VIEW COMPARISON:  CT chest dated February 01, 2020. FINDINGS: The heart size and mediastinal contours are within normal limits. The lungs are hyperinflated with emphysematous changes. Minimal patchy opacity at the left lung base. No focal consolidation, pleural effusion, or pneumothorax. No acute osseous abnormality. IMPRESSION: 1. Minimal left basilar atelectasis versus infiltrate. Electronically Signed   By: Titus Dubin M.D.   On: 05/08/2021 14:11    ____________________________________________   PROCEDURES  Procedure(s) performed (including Critical Care):  .Critical Care Performed by: Lucrezia Starch, MD Authorized by: Lucrezia Starch, MD   Critical care provider  statement:    Critical care time (minutes):  30   Critical care was necessary to treat or prevent imminent or life-threatening deterioration of the following conditions:  Respiratory failure   Critical care was time spent personally by me on the following activities:  Development of treatment plan with patient or  surrogate, discussions with consultants, evaluation of patient's response to treatment, examination of patient, ordering and review of laboratory studies, ordering and review of radiographic studies, ordering and performing treatments and interventions, pulse oximetry, re-evaluation of patient's condition and review of old charts   ____________________________________________   INITIAL IMPRESSION / [place] / ED COURSE      Patient presents with above-stated history exam for assessment of worsening shortness of breath in the setting of above-noted constellation of symptoms and recent diagnosis of COVID-19.  On arrival patient is tachycardic in the 120s and tachypneic at 21 with room air SPO2 88%.  Otherwise with stable vital signs.  He does appear dehydrated.  Lungs are clear bilaterally.  I was able to review ECG strip from EMS showing SVT with conversion to sinus rhythm.  Patient reports chest tightness with exertion and this may represent demand related angina, I have a low suspicion for occlusion MI.  Today as patient is currently chest pain-free and SVT is a very typical presentation for myocardial infarction.  Additional differential considerations include PE, bacterial pneumonia, continued symptomatic COVID-19 bronchitis, metabolic derangements, anemia, and dehydration.  Chest x-ray shows no pneumothorax, large effusion and minimal left atelectasis versus early infiltrate.  No other clear focal consolidation or other clear acute thoracic process.  EKG remarkable for sinus tachycardia with a ventricular rate of 121, without clear evidence of acute ischemia or significant arrhythmia.  Normal axis and unremarkable intervals.  BMP remarkable for potass magnesium is 2.1.  Potassium of 3.2 without other significant electrolyte or metabolic derangements.  TSH is within normal limits.  CBC remarkable for leukocytosis with WBC count of 13.9 as well as a hemoglobin of 11.9.  Procalcitonin  0.22.  UA has some protein and rare bacteria but otherwise not suggestive of acute clinically significant urinary tract infection.  While I suspect patient's presentation and symptoms today are related to acute COVID infection somewhat difficult to exclude superinfection with bacterial pneumonia given appearance of early infiltrate on chest x-ray as well as evidence of tachycardia, tachypnea, leukocytosis and elevated procalcitonin on arrival.  We will treat with Rocephin and azithromycin.  We will also start patient on Decadron intermittent severe for acute hypoxic respiratory failure patient is requiring 2 L nasal cannula to maintain SPO2 of 95%.  On room air she is 88%.  I will obtain a CTA to rule out a PE.  Patient will require admission to hospital service for further evaluation and management.  We will also treat with remdesivir.  Given multiple SIRS criteria met with concerns for possible bacterial pneumonia we will obtain blood cultures and lactic acid as well at somewhat difficult to exclude sepsis at this juncture.  Care patient signed over to assuming father approximately 3:30 PM.  Plan is to follow-up CTA and admit to hospitalist service.     ____________________________________________   FINAL CLINICAL IMPRESSION(S) / ED DIAGNOSES  Final diagnoses:  COVID  Hypokalemia  SVT (supraventricular tachycardia) (HCC)  Dehydration  Acute respiratory failure with hypoxia (HCC)  Community acquired pneumonia of left lower lobe of lung  SIRS (systemic inflammatory response syndrome) (HCC)  Sepsis, due to unspecified organism, unspecified whether acute organ dysfunction  present ([place])    Medications  acetaminophen (TYLENOL) tablet 1,000 mg (1,000 mg Oral Given 05/08/21 1401)  ondansetron (ZOFRAN) injection 4 mg (has no administration in time range)  cefTRIAXone (ROCEPHIN) 1 g in sodium chloride 0.9 % 100 mL IVPB (has no administration in time range)  azithromycin (ZITHROMAX) 500 mg in  sodium chloride 0.9 % 250 mL IVPB (has no administration in time range)  lactated ringers infusion (has no administration in time range)  lactated ringers bolus 1,000 mL (1,000 mLs Intravenous New Bag/Given 05/08/21 1356)  potassium chloride SA (KLOR-CON M) CR tablet 40 mEq (40 mEq Oral Given 05/08/21 1449)  dexamethasone (DECADRON) injection 10 mg (10 mg Intravenous Given 05/08/21 1502)     ED Discharge Orders     None        Note:  This document was prepared using Dragon voice recognition software and may include unintentional dictation errors.    Lucrezia Starch, MD 05/08/21 1539

## 2021-05-08 NOTE — Progress Notes (Signed)
PHARMACIST - PHYSICIAN COMMUNICATION  CONCERNING:  Enoxaparin (Lovenox) for DVT Prophylaxis    RECOMMENDATION: Patient was prescribed enoxaprin 40mg  q24 hours for VTE prophylaxis.   Filed Weights   05/08/21 1326  Weight: 42.5 kg (93 lb 11.1 oz)    Body mass index is 18.61 kg/m.  Estimated Creatinine Clearance: 42.6 mL/min (by C-G formula based on SCr of 0.66 mg/dL).  Patient is candidate for enoxaparin 30mg  every 24 hours based on CrCl <17ml/min or Weight <45kg  DESCRIPTION: Pharmacy has adjusted enoxaparin dose per [facility] policy.  Patient is now receiving enoxaparin 30 mg every 24 hours   Renda Rolls, PharmD, [facility] 05/08/2021 10:55 PM

## 2021-05-09 DIAGNOSIS — J9621 Acute and chronic respiratory failure with hypoxia: Secondary | ICD-10-CM

## 2021-05-09 DIAGNOSIS — U071 COVID-19: Secondary | ICD-10-CM | POA: Diagnosis not present

## 2021-05-09 LAB — RESPIRATORY PANEL BY PCR

## 2021-05-09 LAB — COMPREHENSIVE METABOLIC PANEL
ALT: 26 U/L (ref 0–44)
AST: 32 U/L (ref 15–41)
Albumin: 3 g/dL — ABNORMAL LOW (ref 3.5–5.0)
Alkaline Phosphatase: 57 U/L (ref 38–126)
Anion gap: 7 (ref 5–15)
BUN: 15 mg/dL (ref 8–23)
CO2: 28 mmol/L (ref 22–32)
Calcium: 7.5 mg/dL — ABNORMAL LOW (ref 8.9–10.3)
Chloride: 105 mmol/L (ref 98–111)
Creatinine, Ser: 0.5 mg/dL (ref 0.44–1.00)
GFR, Estimated: >60 mL/min (ref >60)
Glucose, Bld: 123 mg/dL — ABNORMAL HIGH (ref 70–99)
Potassium: 4.2 mmol/L (ref 3.5–5.1)
Sodium: 140 mmol/L (ref 135–145)
Total Bilirubin: 0.4 mg/dL (ref 0.3–1.2)
Total Protein: 5.8 g/dL — ABNORMAL LOW (ref 6.5–8.1)

## 2021-05-09 LAB — LACTIC ACID, PLASMA
Lactic Acid, Venous: 1.3 mmol/L (ref 0.5–1.9)
Lactic Acid, Venous: 0.9 mmol/L (ref 0.5–1.9)

## 2021-05-09 LAB — PROTIME-INR
INR: 1.1 (ref 0.8–1.2)
Prothrombin Time: 14.1 seconds (ref 11.4–15.2)

## 2021-05-09 LAB — APTT: aPTT: 29 seconds (ref 24–36)

## 2021-05-09 LAB — CBC
HCT: 37.4 % (ref 36.0–46.0)
Hemoglobin: 12.1 g/dL (ref 12.0–15.0)
MCH: 30.9 pg (ref 26.0–34.0)
MCHC: 32.4 g/dL (ref 30.0–36.0)
MCV: 95.7 fL (ref 80.0–100.0)
Platelets: 293 10*3/uL (ref 150–400)
RBC: 3.91 MIL/uL (ref 3.87–5.11)
RDW: 14 % (ref 11.5–15.5)
WBC: 14.5 10*3/uL — ABNORMAL HIGH (ref 4.0–10.5)
nRBC: 0 % (ref 0.0–0.2)

## 2021-05-09 LAB — PROCALCITONIN
Procalcitonin: 1.88 ng/mL
Procalcitonin: 1.93 ng/mL

## 2021-05-09 MED ORDER — GUAIFENESIN ER 600 MG PO TB12
600.0000 mg | ORAL_TABLET | Freq: Two times a day (BID) | ORAL | Status: DC
  Administered 2021-05-09 – 2021-05-15 (×12): 600 mg via ORAL
  Filled 2021-05-09 (×12): qty 1

## 2021-05-09 MED ORDER — LORAZEPAM 1 MG PO TABS
1.0000 mg | ORAL_TABLET | Freq: Two times a day (BID) | ORAL | Status: DC | PRN
  Administered 2021-05-10 – 2021-05-11 (×2): 1 mg via ORAL
  Filled 2021-05-09 (×2): qty 1

## 2021-05-09 NOTE — Progress Notes (Signed)
PROGRESS NOTE    Kimberly Schmitt  GEX:[id number] DOB: [birth year] DOA: 05/08/2021 PCP: Crecencio Mc, MD    Chief Complaint  Patient presents with   Chest Pain    Brief Narrative:   Kimberly Schmitt is a 70 y.o. female with medical history significant of tobacco abuse, anxiety as well as recent diagnosis of COVID on 12/26 who presents EMS from home for assessment of shortness of breath   Kimberly Schmitt is a 70 y.o. female with medical history significant of tobacco abuse, anxiety as well as recent diagnosis of COVID on 12/26 who presents EMS from home for assessment of shortness of breath  Subjective:  Reports feeling better, less tachycardia, less tachypnea, she is on 4liter oxygen Granddaughter at bedside  Assessment & Plan:   Principal Problem:   CAP (community acquired pneumonia) Active Problems:   Tobacco abuse   Hyperlipidemia   GAD (generalized anxiety disorder)   COVID-19 virus infection   Sepsis ([place])  Acute hypoxic respiratory failure from COVID-pneumonia She is tested positive for covid 19 on 12/26 -CTA no PE, does show patchy nodular infiltrates in lung bases, likely multifocal pneumonia consistent with COVID-pneumonia, also had chronic emphysematous changes -She is started on remdesivir, Rocephin and Zithromax Monitor crp/lft  SVT, per EMS reports Currently sinus tachycardia Keep K >4, mag>2   Tobacco use, encourage smoking cessation   GAD - continue home meds   HLD - continue home meds.     Body mass index is 18.61 kg/m.Marland Kitchen      Unresulted Labs (From admission, onward)     Start     Ordered   05/15/21 0500  Creatinine, serum  (enoxaparin (LOVENOX)    CrCl >/= 30 ml/min)  Weekly,   STAT     Comments: while on enoxaparin therapy    05/08/21 2006   05/10/21 0500  Magnesium  Tomorrow morning,   R       Question:  Specimen collection method  Answer:  Lab=Lab collect   05/09/21 1807   05/10/21 0500  C-reactive protein  Daily,   R      Question:  Specimen collection method  Answer:  Lab=Lab collect   05/09/21 1808   05/09/21 1238  Respiratory (~20 pathogens) panel by PCR  (Respiratory panel by PCR (~20 pathogens, ~24 hr TAT)  w precautions)  Once,   STAT        05/09/21 1237   05/09/21 0500  Comprehensive metabolic panel  Daily,   STAT      05/08/21 2011   05/09/21 0500  CBC  Daily,   STAT      05/08/21 2011   05/09/21 0500  Procalcitonin  Daily,   STAT      05/08/21 2011              DVT prophylaxis: enoxaparin (LOVENOX) injection 30 mg Start: 05/09/21 0800   Code Status:DNR Family Communication: Granddaughter at bedside Disposition:   Status is: Inpatient  Dispo: The patient is from: Home, independent, report still works              Anticipated d/c is to: Home              Anticipated d/c date is: Need to finish remdesivir, wean oxygen                Consultants:  None  Procedures:  None  Antimicrobials:   Anti-infectives (From admission, onward)    Start  Dose/Rate Route Frequency Ordered Stop   05/09/21 1000  remdesivir 100 mg in sodium chloride 0.9 % 100 mL IVPB       See Hyperspace for full Linked Orders Report.   100 mg 200 mL/hr over 30 Minutes Intravenous Daily 05/08/21 1542 05/13/21 0959   05/09/21 0800  azithromycin (ZITHROMAX) tablet 500 mg        500 mg Oral Daily 05/08/21 2006 05/13/21 0959   05/09/21 0600  cefTRIAXone (ROCEPHIN) 2 g in sodium chloride 0.9 % 100 mL IVPB        2 g 200 mL/hr over 30 Minutes Intravenous Every 24 hours 05/08/21 2006 05/14/21 0559   05/08/21 1545  remdesivir 200 mg in sodium chloride 0.9% 250 mL IVPB       See Hyperspace for full Linked Orders Report.   200 mg 580 mL/hr over 30 Minutes Intravenous Once 05/08/21 1542 05/08/21 1808   05/08/21 1530  cefTRIAXone (ROCEPHIN) 1 g in sodium chloride 0.9 % 100 mL IVPB        1 g 200 mL/hr over 30 Minutes Intravenous  Once 05/08/21 1527 05/08/21 1620   05/08/21 1530  azithromycin (ZITHROMAX) 500 mg  in sodium chloride 0.9 % 250 mL IVPB        500 mg 250 mL/hr over 60 Minutes Intravenous  Once 05/08/21 1527 05/08/21 1716           Objective: Vitals:   05/09/21 0800 05/09/21 1033 05/09/21 1236 05/09/21 1357  BP: 112/68 (!) 110/57 111/67 105/70  Pulse: 91 94 (!) 101 98  Resp: (!) 24 (!) 31 (!) 22 20  Temp: 97.6 F (36.4 C)   98 F (36.7 C)  TempSrc: Oral   Oral  SpO2: 97% 98% 96% 98%  Weight:      Height:        Intake/Output Summary (Last 24 hours) at 05/09/2021 1809 Last data filed at 05/09/2021 1700 Gross per 24 hour  Intake 114.25 ml  Output --  Net 114.25 ml   Filed Weights   05/08/21 1326  Weight: 42.5 kg    Examination:  General exam: alert, awake, slightly anxious Respiratory system: coarse breath sound bilaterally, slight tachypneic, no accessory muscle use. Respiratory effort normal. Cardiovascular system: Sinus tachycardia Gastrointestinal system: Abdomen is nondistended, soft and nontender.  Normal bowel sounds heard. Central nervous system: Alert and oriented. No focal neurological deficits. Extremities:  no edema Skin: No rashes, lesions or ulcers Psychiatry: Judgement and insight appear normal. Mood & affect appropriate.     Data Reviewed: I have personally reviewed following labs and imaging studies  CBC: Recent Labs  Lab 05/08/21 1329 05/09/21 0724  WBC 13.9* 14.5*  NEUTROABS 12.4*  --   HGB 11.9* 12.1  HCT 37.1 37.4  MCV 95.1 95.7  PLT 270 951    Basic Metabolic Panel: Recent Labs  Lab 05/08/21 1329 05/09/21 0724  NA 141 140  K 3.2* 4.2  CL 105 105  CO2 29 28  GLUCOSE 146* 123*  BUN 16 15  CREATININE 0.66 0.50  CALCIUM 8.1* 7.5*  MG 2.1  --     GFR: Estimated Creatinine Clearance: 42.6 mL/min (by C-G formula based on SCr of 0.5 mg/dL).  Liver Function Tests: Recent Labs  Lab 05/09/21 0724  AST 32  ALT 26  ALKPHOS 57  BILITOT 0.4  PROT 5.8*  ALBUMIN 3.0*    CBG: No results for input(s): GLUCAP in the  last 168 hours.   Recent Results (from  the past 240 hour(s))  Blood Culture (routine x 2)     Status: None (Preliminary result)   Collection Time: 05/08/21  1:57 PM   Specimen: BLOOD  Result Value Ref Range Status   Specimen Description BLOOD RIGHT ANTECUBITAL  Final   Special Requests   Final    BOTTLES DRAWN AEROBIC AND ANAEROBIC Blood Culture adequate volume   Culture   Final    NO GROWTH < 24 HOURS Performed at [facility], [address] [place], [place] [address]    Report Status PENDING  Incomplete  Blood Culture (routine x 2)     Status: None (Preliminary result)   Collection Time: 05/08/21  8:10 PM   Specimen: BLOOD  Result Value Ref Range Status   Specimen Description BLOOD BLOOD LEFT HAND  Final   Special Requests   Final    BOTTLES DRAWN AEROBIC AND ANAEROBIC Blood Culture adequate volume   Culture   Final    NO GROWTH < 12 HOURS Performed at [name], [address] [place],  [address]    Report Status PENDING  Incomplete         Radiology Studies: CT Angio Chest PE W and/or Wo Contrast  Result Date: 05/08/2021 CLINICAL DATA:  Pulmonary embolus suspected with high probability. Recent diagnosis of COVID. Shortness of breath. EXAM: CT ANGIOGRAPHY CHEST WITH CONTRAST TECHNIQUE: Multidetector CT imaging of the chest was performed using the standard protocol during bolus administration of intravenous contrast. Multiplanar CT image reconstructions and MIPs were obtained to evaluate the vascular anatomy. CONTRAST:  30mL OMNIPAQUE IOHEXOL 350 MG/ML SOLN COMPARISON:  None 07/29/2019 FINDINGS: Cardiovascular: Good opacification of the central and segmental pulmonary arteries. No focal filling defects. No evidence of significant pulmonary embolus. Normal heart size. No pericardial effusions. Normal caliber thoracic aorta. No aortic dissection. Great vessel origins are patent. Coronary artery and aortic calcifications. Mediastinum/Nodes:  Esophagus is decompressed. No significant lymphadenopathy. Thyroid gland is unremarkable. Lungs/Pleura: Emphysematous changes in the lungs. Bronchial wall thickening consistent with chronic bronchitis. Patchy nodular airspace disease most prominent in the lung bases bilaterally. This is likely multifocal pneumonia. No pleural effusions. No pneumothorax. Upper Abdomen: No acute abnormality. Musculoskeletal: Degenerative changes in the spine. No destructive bone lesions. Review of the MIP images confirms the above findings. IMPRESSION: 1. No evidence of significant pulmonary embolus. 2. Patchy nodular infiltrates in the lung bases, likely multifocal pneumonia and consistent with COVID pneumonia. 3. Chronic emphysematous and bronchitic changes in the lungs. Electronically Signed   By: Lucienne Capers M.D.   On: 05/08/2021 17:14   DG Chest Port 1 View  Result Date: 05/08/2021 CLINICAL DATA:  Shortness of breath.  COVID positive. EXAM: PORTABLE CHEST 1 VIEW COMPARISON:  CT chest dated February 01, 2020. FINDINGS: The heart size and mediastinal contours are within normal limits. The lungs are hyperinflated with emphysematous changes. Minimal patchy opacity at the left lung base. No focal consolidation, pleural effusion, or pneumothorax. No acute osseous abnormality. IMPRESSION: 1. Minimal left basilar atelectasis versus infiltrate. Electronically Signed   By: Titus Dubin M.D.   On: 05/08/2021 14:11        Scheduled Meds:  aspirin EC  81 mg Oral Daily   atorvastatin  20 mg Oral Daily   azithromycin  500 mg Oral Daily   dexamethasone  6 mg Oral Q breakfast   enoxaparin (LOVENOX) injection  30 mg Subcutaneous Q24H   guaiFENesin  600 mg Oral BID   PARoxetine  10 mg  Oral Daily   Continuous Infusions:  cefTRIAXone (ROCEPHIN)  IV Stopped (05/09/21 4451)   remdesivir 100 mg in NS 100 mL Stopped (05/09/21 1650)     LOS: 1 day   Time spent: 38mins Greater than 50% of this time was spent in  counseling, explanation of diagnosis, planning of further management, and coordination of care.   Voice Recognition Viviann Spare dictation system was used to create this note, attempts have been made to correct errors. Please contact the author with questions and/or clarifications.   Florencia Reasons, MD PhD FACP [facility]  Available via Epic secure chat 7am-7pm for nonurgent issues Please page for urgent issues To page the attending provider between 7A-7P or the covering provider during after hours 7P-7A, please log into the web site [email or web address] and access using universal [place] password for that web site. If you do not have the password, please call the hospital operator.    05/09/2021, 6:09 PM

## 2021-05-09 NOTE — ED Notes (Signed)
Informed RN bed assigned 

## 2021-05-10 ENCOUNTER — Encounter: Payer: Self-pay | Admitting: Internal Medicine

## 2021-05-10 DIAGNOSIS — J9621 Acute and chronic respiratory failure with hypoxia: Secondary | ICD-10-CM | POA: Diagnosis not present

## 2021-05-10 DIAGNOSIS — U071 COVID-19: Secondary | ICD-10-CM | POA: Diagnosis not present

## 2021-05-10 DIAGNOSIS — J189 Pneumonia, unspecified organism: Secondary | ICD-10-CM | POA: Diagnosis not present

## 2021-05-10 LAB — BLOOD CULTURE ID PANEL (REFLEXED) - BCID2

## 2021-05-10 LAB — COMPREHENSIVE METABOLIC PANEL
ALT: 25 U/L (ref 0–44)
AST: 24 U/L (ref 15–41)
Albumin: 2.8 g/dL — ABNORMAL LOW (ref 3.5–5.0)
Alkaline Phosphatase: 61 U/L (ref 38–126)
Anion gap: 5 (ref 5–15)
BUN: 28 mg/dL — ABNORMAL HIGH (ref 8–23)
CO2: 29 mmol/L (ref 22–32)
Calcium: 7.7 mg/dL — ABNORMAL LOW (ref 8.9–10.3)
Chloride: 108 mmol/L (ref 98–111)
Creatinine, Ser: 0.54 mg/dL (ref 0.44–1.00)
GFR, Estimated: >60 mL/min (ref >60)
Glucose, Bld: 130 mg/dL — ABNORMAL HIGH (ref 70–99)
Potassium: 4 mmol/L (ref 3.5–5.1)
Sodium: 142 mmol/L (ref 135–145)
Total Bilirubin: 0.6 mg/dL (ref 0.3–1.2)
Total Protein: 5.7 g/dL — ABNORMAL LOW (ref 6.5–8.1)

## 2021-05-10 LAB — CBC
HCT: 38.5 % (ref 36.0–46.0)
Hemoglobin: 12.1 g/dL (ref 12.0–15.0)
MCH: 29.8 pg (ref 26.0–34.0)
MCHC: 31.4 g/dL (ref 30.0–36.0)
MCV: 94.8 fL (ref 80.0–100.0)
Platelets: 346 10*3/uL (ref 150–400)
RBC: 4.06 MIL/uL (ref 3.87–5.11)
RDW: 13.6 % (ref 11.5–15.5)
WBC: 19.8 10*3/uL — ABNORMAL HIGH (ref 4.0–10.5)
nRBC: 0 % (ref 0.0–0.2)

## 2021-05-10 LAB — MAGNESIUM: Magnesium: 2.8 mg/dL — ABNORMAL HIGH (ref 1.7–2.4)

## 2021-05-10 LAB — PROCALCITONIN: Procalcitonin: 1.6 ng/mL

## 2021-05-10 LAB — C-REACTIVE PROTEIN: CRP: 5.7 mg/dL — ABNORMAL HIGH (ref <1.0)

## 2021-05-10 NOTE — Progress Notes (Signed)
PHARMACY - PHYSICIAN COMMUNICATION CRITICAL VALUE ALERT - BLOOD CULTURE IDENTIFICATION (BCID)  BCID results:  1 of 4 vials with Haemophilus influenzae.  Pt is on Ceftriaxone 2 gm and Azithromycin.  Name of provider contacted: Rachael Fee, NP  Changes to prescribed antibiotics required: No changes at this time.  Renda Rolls, PharmD, [facility] 05/10/2021 2:32 AM

## 2021-05-10 NOTE — Progress Notes (Signed)
PROGRESS NOTE    Kimberly Schmitt  CBJ:[id number] DOB: [birth year] DOA: 05/08/2021 PCP: Crecencio Mc, MD  Assessment & Plan:   Principal Problem:   CAP (community acquired pneumonia) Active Problems:   Tobacco abuse   Hyperlipidemia   GAD (generalized anxiety disorder)   COVID-19 virus infection   Sepsis ([place])  COVID19 pneumonia: continue on IV remdesivir, steroids & bronchodilators. Encourage incentive spirometry  Possible superimposed bacterial pneumonia: continue on IV rocephin, azithromycin, bronchodilators. Encourage incentive spirometry   Acute hypoxic respiratory failure: likely secondary to above. Found to be 88% on RA on 05/08/21. Continue on supplemental oxygen and wean as tolerated. Encourage incentive spirometry   Leukocytosis: likely secondary to steroid use & infection   Smoker: received smoking cessation counseling  HLD: continue on statin   Anxiety: severity unknown. Ativan prn   Depression: severity unknown. Continue on home dose of paroxetine    DVT prophylaxis: lovenox  Code Status: DNR Family Communication: discussed pt's care w/ pt's family at bedside and answered their questions  Disposition Plan: likely d/c back home   Level of care: Med-Surg  Status is: Inpatient  Remains inpatient appropriate because: severity of illness     Consultants:    Procedures:    Antimicrobials: azithromycin, rocephin    Subjective: Pt c/o shortness of breath   Objective: Vitals:   05/09/21 1357 05/09/21 2004 05/09/21 2330 05/10/21 0544  BP: 105/70 122/77 124/75 121/82  Pulse: 98 85 96 (!) 102  Resp: 20 20 16 18   Temp: 98 F (36.7 C) 98.7 F (37.1 C) 98 F (36.7 C) 97.8 F (36.6 C)  TempSrc: Oral Oral Oral Oral  SpO2: 98% 98% 99% 96%  Weight:      Height:        Intake/Output Summary (Last 24 hours) at 05/10/2021 0733 Last data filed at 05/10/2021 0000 Gross per 24 hour  Intake 190.06 ml  Output --  Net 190.06 ml   Filed Weights    05/08/21 1326  Weight: 42.5 kg    Examination:  General exam: Appears calm but uncomfortable  Respiratory system: course breath sounds b/l  Cardiovascular system: S1 & S2 +. No rubs, gallops or clicks.  Gastrointestinal system: Abdomen is nondistended, soft and nontender. Normal bowel sounds heard. Central nervous system: Alert and oriented. Moves all extremities  Psychiatry: Judgement and insight appear normal. Flat mood and affect     Data Reviewed: I have personally reviewed following labs and imaging studies  CBC: Recent Labs  Lab 05/08/21 1329 05/09/21 0724 05/10/21 0517  WBC 13.9* 14.5* 19.8*  NEUTROABS 12.4*  --   --   HGB 11.9* 12.1 12.1  HCT 37.1 37.4 38.5  MCV 95.1 95.7 94.8  PLT 270 293 160   Basic Metabolic Panel: Recent Labs  Lab 05/08/21 1329 05/09/21 0724 05/10/21 0517  NA 141 140 142  K 3.2* 4.2 4.0  CL 105 105 108  CO2 29 28 29   GLUCOSE 146* 123* 130*  BUN 16 15 28*  CREATININE 0.66 0.50 0.54  CALCIUM 8.1* 7.5* 7.7*  MG 2.1  --  2.8*   GFR: Estimated Creatinine Clearance: 42.6 mL/min (by C-G formula based on SCr of 0.54 mg/dL). Liver Function Tests: Recent Labs  Lab 05/09/21 0724 05/10/21 0517  AST 32 24  ALT 26 25  ALKPHOS 57 61  BILITOT 0.4 0.6  PROT 5.8* 5.7*  ALBUMIN 3.0* 2.8*   No results for input(s): LIPASE, AMYLASE in the last 168 hours. No results for  input(s): AMMONIA in the last 168 hours. Coagulation Profile: Recent Labs  Lab 05/09/21 0724  INR 1.1   Cardiac Enzymes: No results for input(s): CKTOTAL, CKMB, CKMBINDEX, TROPONINI in the last 168 hours. BNP (last 3 results) No results for input(s): PROBNP in the last 8760 hours. HbA1C: No results for input(s): HGBA1C in the last 72 hours. CBG: No results for input(s): GLUCAP in the last 168 hours. Lipid Profile: No results for input(s): CHOL, HDL, LDLCALC, TRIG, CHOLHDL, LDLDIRECT in the last 72 hours. Thyroid Function Tests: Recent Labs    05/08/21 1329  TSH  0.537   Anemia Panel: No results for input(s): VITAMINB12, FOLATE, FERRITIN, TIBC, IRON, RETICCTPCT in the last 72 hours. Sepsis Labs: Recent Labs  Lab 05/08/21 1329 05/08/21 1854 05/08/21 2010 05/09/21 0724 05/09/21 1442 05/10/21 0517  PROCALCITON 0.22  --   --  1.88   1.93  --  1.60  LATICACIDVEN  --  1.2 1.3 1.3 0.9  --     Recent Results (from the past 240 hour(s))  Blood Culture (routine x 2)     Status: None (Preliminary result)   Collection Time: 05/08/21  1:57 PM   Specimen: BLOOD  Result Value Ref Range Status   Specimen Description BLOOD RIGHT ANTECUBITAL  Final   Special Requests   Final    BOTTLES DRAWN AEROBIC AND ANAEROBIC Blood Culture adequate volume   Culture  Setup Time   Final    GRAM NEGATIVE RODS IN BOTH AEROBIC AND ANAEROBIC BOTTLES Organism ID to follow CRITICAL RESULT CALLED TO, READ BACK BY AND VERIFIED WITH: NATHAN BELUE AT 0100 05/10/2021 DLB Performed at [facility], [address] [place], [place] [address]    Culture GRAM NEGATIVE RODS  Final   Report Status PENDING  Incomplete  Blood Culture ID Panel (Reflexed)     Status: Abnormal   Collection Time: 05/08/21  1:57 PM  Result Value Ref Range Status   Enterococcus faecalis NOT DETECTED NOT DETECTED Final   Enterococcus Faecium NOT DETECTED NOT DETECTED Final   Listeria monocytogenes NOT DETECTED NOT DETECTED Final   Staphylococcus species NOT DETECTED NOT DETECTED Final   Staphylococcus aureus (BCID) NOT DETECTED NOT DETECTED Final   Staphylococcus epidermidis NOT DETECTED NOT DETECTED Final   Staphylococcus lugdunensis NOT DETECTED NOT DETECTED Final   Streptococcus species NOT DETECTED NOT DETECTED Final   Streptococcus agalactiae NOT DETECTED NOT DETECTED Final   Streptococcus pneumoniae NOT DETECTED NOT DETECTED Final   Streptococcus pyogenes NOT DETECTED NOT DETECTED Final   A.calcoaceticus-baumannii NOT DETECTED NOT DETECTED Final   Bacteroides fragilis NOT DETECTED NOT  DETECTED Final   Enterobacterales NOT DETECTED NOT DETECTED Final   Enterobacter cloacae complex NOT DETECTED NOT DETECTED Final   Escherichia coli NOT DETECTED NOT DETECTED Final   Klebsiella aerogenes NOT DETECTED NOT DETECTED Final   Klebsiella oxytoca NOT DETECTED NOT DETECTED Final   Klebsiella pneumoniae NOT DETECTED NOT DETECTED Final   Proteus species NOT DETECTED NOT DETECTED Final   Salmonella species NOT DETECTED NOT DETECTED Final   Serratia marcescens NOT DETECTED NOT DETECTED Final   Haemophilus influenzae DETECTED (A) NOT DETECTED Final    Comment: CRITICAL RESULT CALLED TO, READ BACK BY AND VERIFIED WITH: NATHAN BELUE AT 0100 05/10/2021 DLB    Neisseria meningitidis NOT DETECTED NOT DETECTED Final   Pseudomonas aeruginosa NOT DETECTED NOT DETECTED Final   Stenotrophomonas maltophilia NOT DETECTED NOT DETECTED Final   Candida albicans NOT DETECTED NOT DETECTED Final  Candida auris NOT DETECTED NOT DETECTED Final   Candida glabrata NOT DETECTED NOT DETECTED Final   Candida krusei NOT DETECTED NOT DETECTED Final   Candida parapsilosis NOT DETECTED NOT DETECTED Final   Candida tropicalis NOT DETECTED NOT DETECTED Final   Cryptococcus neoformans/gattii NOT DETECTED NOT DETECTED Final    Comment: Performed at [facility], [address] [place], [place] [address]  Blood Culture (routine x 2)     Status: None (Preliminary result)   Collection Time: 05/08/21  8:10 PM   Specimen: BLOOD  Result Value Ref Range Status   Specimen Description BLOOD BLOOD LEFT HAND  Final   Special Requests   Final    BOTTLES DRAWN AEROBIC AND ANAEROBIC Blood Culture adequate volume   Culture   Final    NO GROWTH 2 DAYS Performed at [facility], [address], [place], [place] [address]    Report Status PENDING  Incomplete  Respiratory (~20 pathogens) panel by PCR     Status: None   Collection Time: 05/09/21  1:32 PM   Specimen: Nasopharyngeal Swab; Respiratory   Result Value Ref Range Status   Adenovirus NOT DETECTED NOT DETECTED Final   Coronavirus 229E NOT DETECTED NOT DETECTED Final    Comment: (NOTE) The Coronavirus on the Respiratory Panel, DOES NOT test for the novel  Coronavirus (2019 nCoV)    Coronavirus HKU1 NOT DETECTED NOT DETECTED Final   Coronavirus NL63 NOT DETECTED NOT DETECTED Final   Coronavirus OC43 NOT DETECTED NOT DETECTED Final   Metapneumovirus NOT DETECTED NOT DETECTED Final   Rhinovirus / Enterovirus NOT DETECTED NOT DETECTED Final   Influenza A NOT DETECTED NOT DETECTED Final   Influenza B NOT DETECTED NOT DETECTED Final   Parainfluenza Virus 1 NOT DETECTED NOT DETECTED Final   Parainfluenza Virus 2 NOT DETECTED NOT DETECTED Final   Parainfluenza Virus 3 NOT DETECTED NOT DETECTED Final   Parainfluenza Virus 4 NOT DETECTED NOT DETECTED Final   Respiratory Syncytial Virus NOT DETECTED NOT DETECTED Final   Bordetella pertussis NOT DETECTED NOT DETECTED Final   Bordetella Parapertussis NOT DETECTED NOT DETECTED Final   Chlamydophila pneumoniae NOT DETECTED NOT DETECTED Final   Mycoplasma pneumoniae NOT DETECTED NOT DETECTED Final    Comment: Performed at [facility], [place] [address], [place], [place] [address]         Radiology Studies: CT Angio Chest PE W and/or Wo Contrast  Result Date: 05/08/2021 CLINICAL DATA:  Pulmonary embolus suspected with high probability. Recent diagnosis of COVID. Shortness of breath. EXAM: CT ANGIOGRAPHY CHEST WITH CONTRAST TECHNIQUE: Multidetector CT imaging of the chest was performed using the standard protocol during bolus administration of intravenous contrast. Multiplanar CT image reconstructions and MIPs were obtained to evaluate the vascular anatomy. CONTRAST:  67mL OMNIPAQUE IOHEXOL 350 MG/ML SOLN COMPARISON:  None 07/29/2019 FINDINGS: Cardiovascular: Good opacification of the central and segmental pulmonary arteries. No focal filling defects. No evidence of significant  pulmonary embolus. Normal heart size. No pericardial effusions. Normal caliber thoracic aorta. No aortic dissection. Great vessel origins are patent. Coronary artery and aortic calcifications. Mediastinum/Nodes: Esophagus is decompressed. No significant lymphadenopathy. Thyroid gland is unremarkable. Lungs/Pleura: Emphysematous changes in the lungs. Bronchial wall thickening consistent with chronic bronchitis. Patchy nodular airspace disease most prominent in the lung bases bilaterally. This is likely multifocal pneumonia. No pleural effusions. No pneumothorax. Upper Abdomen: No acute abnormality. Musculoskeletal: Degenerative changes in the spine. No destructive bone lesions. Review of the MIP images confirms  the above findings. IMPRESSION: 1. No evidence of significant pulmonary embolus. 2. Patchy nodular infiltrates in the lung bases, likely multifocal pneumonia and consistent with COVID pneumonia. 3. Chronic emphysematous and bronchitic changes in the lungs. Electronically Signed   By: Lucienne Capers M.D.   On: 05/08/2021 17:14   DG Chest Port 1 View  Result Date: 05/08/2021 CLINICAL DATA:  Shortness of breath.  COVID positive. EXAM: PORTABLE CHEST 1 VIEW COMPARISON:  CT chest dated February 01, 2020. FINDINGS: The heart size and mediastinal contours are within normal limits. The lungs are hyperinflated with emphysematous changes. Minimal patchy opacity at the left lung base. No focal consolidation, pleural effusion, or pneumothorax. No acute osseous abnormality. IMPRESSION: 1. Minimal left basilar atelectasis versus infiltrate. Electronically Signed   By: Titus Dubin M.D.   On: 05/08/2021 14:11        Scheduled Meds:  aspirin EC  81 mg Oral Daily   atorvastatin  20 mg Oral Daily   azithromycin  500 mg Oral Daily   dexamethasone  6 mg Oral Q breakfast   enoxaparin (LOVENOX) injection  30 mg Subcutaneous Q24H   guaiFENesin  600 mg Oral BID   PARoxetine  10 mg Oral Daily   Continuous  Infusions:  cefTRIAXone (ROCEPHIN)  IV 2 g (05/10/21 0541)   remdesivir 100 mg in NS 100 mL Stopped (05/09/21 1650)     LOS: 2 days    Time spent: 32 mins    Wyvonnia Dusky, MD [facility] Pager [phone]  If 7PM-7AM, please contact night-coverage 05/10/2021, 7:33 AM

## 2021-05-10 NOTE — Plan of Care (Signed)
  Problem: Education: Goal: Knowledge of General Education information will improve Description Including pain rating scale, medication(s)/side effects and non-pharmacologic comfort measures Outcome: Progressing   Problem: Health Behavior/Discharge Planning: Goal: Ability to manage health-related needs will improve Outcome: Progressing   

## 2021-05-10 NOTE — TOC CM/SW Note (Signed)
°  Transition of Care (TOC) Screening Note   Patient Details  Name: Kimberly Schmitt Date of Birth: 04-Nov-1950   Transition of Care [facility]) CM/SW Contact:    Magnus Ivan, LCSW Phone Number: 05/10/2021, 10:39 AM    Transition of Care Department [facility]) has reviewed patient and no TOC needs have been identified at this time. We will continue to monitor patient advancement through interdisciplinary progression rounds. If new patient transition needs arise, please place a TOC consult.

## 2021-05-11 DIAGNOSIS — U071 COVID-19: Secondary | ICD-10-CM | POA: Diagnosis not present

## 2021-05-11 DIAGNOSIS — J9621 Acute and chronic respiratory failure with hypoxia: Secondary | ICD-10-CM | POA: Diagnosis not present

## 2021-05-11 DIAGNOSIS — J189 Pneumonia, unspecified organism: Secondary | ICD-10-CM | POA: Diagnosis not present

## 2021-05-11 LAB — CBC
HCT: 35.9 % — ABNORMAL LOW (ref 36.0–46.0)
Hemoglobin: 11.7 g/dL — ABNORMAL LOW (ref 12.0–15.0)
MCH: 30.8 pg (ref 26.0–34.0)
MCHC: 32.6 g/dL (ref 30.0–36.0)
MCV: 94.5 fL (ref 80.0–100.0)
Platelets: 387 10*3/uL (ref 150–400)
RBC: 3.8 MIL/uL — ABNORMAL LOW (ref 3.87–5.11)
RDW: 13.6 % (ref 11.5–15.5)
WBC: 19.3 10*3/uL — ABNORMAL HIGH (ref 4.0–10.5)
nRBC: 0 % (ref 0.0–0.2)

## 2021-05-11 LAB — C-REACTIVE PROTEIN: CRP: 2 mg/dL — ABNORMAL HIGH (ref <1.0)

## 2021-05-11 LAB — COMPREHENSIVE METABOLIC PANEL
ALT: 24 U/L (ref 0–44)
AST: 23 U/L (ref 15–41)
Albumin: 2.7 g/dL — ABNORMAL LOW (ref 3.5–5.0)
Alkaline Phosphatase: 58 U/L (ref 38–126)
Anion gap: 8 (ref 5–15)
BUN: 23 mg/dL (ref 8–23)
CO2: 28 mmol/L (ref 22–32)
Calcium: 7.8 mg/dL — ABNORMAL LOW (ref 8.9–10.3)
Chloride: 103 mmol/L (ref 98–111)
Creatinine, Ser: 0.52 mg/dL (ref 0.44–1.00)
GFR, Estimated: >60 mL/min (ref >60)
Glucose, Bld: 104 mg/dL — ABNORMAL HIGH (ref 70–99)
Potassium: 3.8 mmol/L (ref 3.5–5.1)
Sodium: 139 mmol/L (ref 135–145)
Total Bilirubin: 0.5 mg/dL (ref 0.3–1.2)
Total Protein: 5.5 g/dL — ABNORMAL LOW (ref 6.5–8.1)

## 2021-05-11 NOTE — Progress Notes (Signed)
PROGRESS NOTE    Kimberly Schmitt  JOI:[id number] DOB: 03-06-1951 DOA: 05/08/2021 PCP: Crecencio Mc, MD  Assessment & Plan:   Principal Problem:   CAP (community acquired pneumonia) Active Problems:   Tobacco abuse   Hyperlipidemia   GAD (generalized anxiety disorder)   COVID-19 virus infection   Sepsis ([place])  COVID19 pneumonia: continue on IV remdesivir, steroids & bronchodilators. Encourage incentive spirometry   Possible superimposed bacterial pneumonia: Continue on azithromycin, IV rocephin, steroids and bronchodilators. Encourage incentive spirometry   Acute hypoxic respiratory failure: likely secondary to above. Found to be 88% on RA on 05/08/21. Continue on supplemental oxygen and wean as tolerated. Encourage incentive spirometry   Leukocytosis: likely secondary to steroid use and infection   Smoker: received smoking cessation counseling  HLD: continue on statin   Anxiety: severity unknown. Ativan prn   Depression: severity unknown. Continue on home dose of paroxetine    DVT prophylaxis: lovenox  Code Status: DNR Family Communication: discussed pt's care w/ pt's family at bedside and answered their questions  Disposition Plan: likely d/c back home   Level of care: Med-Surg  Status is: Inpatient  Remains inpatient appropriate because: severity of illness     Consultants:    Procedures:    Antimicrobials: azithromycin, rocephin    Subjective: Pt c/o hoarseness and shortness of breath   Objective: Vitals:   05/10/21 2100 05/11/21 0051 05/11/21 0543 05/11/21 0753  BP: 140/87 (!) 145/92 (!) 137/94 138/80  Pulse: (!) 108 (!) 101 98 97  Resp: 16 20 20 20   Temp: 98.2 F (36.8 C) 98.1 F (36.7 C) 98.2 F (36.8 C) 98 F (36.7 C)  TempSrc: Oral Oral Oral Oral  SpO2: 95% 97% 96% 96%  Weight:      Height:        Intake/Output Summary (Last 24 hours) at 05/11/2021 1303 Last data filed at 05/10/2021 1500 Gross per 24 hour  Intake 200 ml   Output --  Net 200 ml   Filed Weights   05/08/21 1326  Weight: 42.5 kg    Examination:  General exam: Appears uncomfortable  Respiratory system: diminished breath sounds b/l.  Cardiovascular system: S1/S2+. No rubs or gallops   Gastrointestinal system: Abd is soft, NT, ND & normal bowel sounds  Central nervous system: Alert and oriented. Moves all extremities  Psychiatry: Judgement and insight appears normal. Flat mood and affect      Data Reviewed: I have personally reviewed following labs and imaging studies  CBC: Recent Labs  Lab 05/08/21 1329 05/09/21 0724 05/10/21 0517 05/11/21 0658  WBC 13.9* 14.5* 19.8* 19.3*  NEUTROABS 12.4*  --   --   --   HGB 11.9* 12.1 12.1 11.7*  HCT 37.1 37.4 38.5 35.9*  MCV 95.1 95.7 94.8 94.5  PLT 270 293 346 470   Basic Metabolic Panel: Recent Labs  Lab 05/08/21 1329 05/09/21 0724 05/10/21 0517 05/11/21 0658  NA 141 140 142 139  K 3.2* 4.2 4.0 3.8  CL 105 105 108 103  CO2 29 28 29 28   GLUCOSE 146* 123* 130* 104*  BUN 16 15 28* 23  CREATININE 0.66 0.50 0.54 0.52  CALCIUM 8.1* 7.5* 7.7* 7.8*  MG 2.1  --  2.8*  --    GFR: Estimated Creatinine Clearance: 42.6 mL/min (by C-G formula based on SCr of 0.52 mg/dL). Liver Function Tests: Recent Labs  Lab 05/09/21 0724 05/10/21 0517 05/11/21 0658  AST 32 24 23  ALT 26 25 24  ALKPHOS 57 61 58  BILITOT 0.4 0.6 0.5  PROT 5.8* 5.7* 5.5*  ALBUMIN 3.0* 2.8* 2.7*   No results for input(s): LIPASE, AMYLASE in the last 168 hours. No results for input(s): AMMONIA in the last 168 hours. Coagulation Profile: Recent Labs  Lab 05/09/21 0724  INR 1.1   Cardiac Enzymes: No results for input(s): CKTOTAL, CKMB, CKMBINDEX, TROPONINI in the last 168 hours. BNP (last 3 results) No results for input(s): PROBNP in the last 8760 hours. HbA1C: No results for input(s): HGBA1C in the last 72 hours. CBG: No results for input(s): GLUCAP in the last 168 hours. Lipid Profile: No results for  input(s): CHOL, HDL, LDLCALC, TRIG, CHOLHDL, LDLDIRECT in the last 72 hours. Thyroid Function Tests: Recent Labs    05/08/21 1329  TSH 0.537   Anemia Panel: No results for input(s): VITAMINB12, FOLATE, FERRITIN, TIBC, IRON, RETICCTPCT in the last 72 hours. Sepsis Labs: Recent Labs  Lab 05/08/21 1329 05/08/21 1854 05/08/21 2010 05/09/21 0724 05/09/21 1442 05/10/21 0517  PROCALCITON 0.22  --   --  1.88   1.93  --  1.60  LATICACIDVEN  --  1.2 1.3 1.3 0.9  --     Recent Results (from the past 240 hour(s))  Blood Culture (routine x 2)     Status: Abnormal (Preliminary result)   Collection Time: 05/08/21  1:57 PM   Specimen: BLOOD  Result Value Ref Range Status   Specimen Description   Final    BLOOD RIGHT ANTECUBITAL Performed at [facility], [address] [place], [place] [address]    Special Requests   Final    BOTTLES DRAWN AEROBIC AND ANAEROBIC Blood Culture adequate volume Performed at [facility], [address], [place], [place] [address]    Culture  Setup Time   Final    GRAM NEGATIVE RODS IN BOTH AEROBIC AND ANAEROBIC BOTTLES Organism ID to follow CRITICAL RESULT CALLED TO, READ BACK BY AND VERIFIED WITH: NATHAN BELUE AT 0100 05/10/2021 DLB Performed at [facility], [address] [place], [place] [address]    Culture (A)  Final    HAEMOPHILUS INFLUENZAE BETA LACTAMASE POSITIVE Performed at [facility], [place] [address], [place], [place] [address]    Report Status PENDING  Incomplete  Blood Culture ID Panel (Reflexed)     Status: Abnormal   Collection Time: 05/08/21  1:57 PM  Result Value Ref Range Status   Enterococcus faecalis NOT DETECTED NOT DETECTED Final   Enterococcus Faecium NOT DETECTED NOT DETECTED Final   Listeria monocytogenes NOT DETECTED NOT DETECTED Final   Staphylococcus species NOT DETECTED NOT DETECTED Final   Staphylococcus aureus (BCID) NOT DETECTED NOT DETECTED Final   Staphylococcus epidermidis  NOT DETECTED NOT DETECTED Final   Staphylococcus lugdunensis NOT DETECTED NOT DETECTED Final   Streptococcus species NOT DETECTED NOT DETECTED Final   Streptococcus agalactiae NOT DETECTED NOT DETECTED Final   Streptococcus pneumoniae NOT DETECTED NOT DETECTED Final   Streptococcus pyogenes NOT DETECTED NOT DETECTED Final   A.calcoaceticus-baumannii NOT DETECTED NOT DETECTED Final   Bacteroides fragilis NOT DETECTED NOT DETECTED Final   Enterobacterales NOT DETECTED NOT DETECTED Final   Enterobacter cloacae complex NOT DETECTED NOT DETECTED Final   Escherichia coli NOT DETECTED NOT DETECTED Final   Klebsiella aerogenes NOT DETECTED NOT DETECTED Final   Klebsiella oxytoca NOT DETECTED NOT DETECTED Final   Klebsiella pneumoniae NOT DETECTED NOT DETECTED Final   Proteus species NOT DETECTED NOT DETECTED Final  Salmonella species NOT DETECTED NOT DETECTED Final   Serratia marcescens NOT DETECTED NOT DETECTED Final   Haemophilus influenzae DETECTED (A) NOT DETECTED Final    Comment: CRITICAL RESULT CALLED TO, READ BACK BY AND VERIFIED WITH: NATHAN BELUE AT 0100 05/10/2021 DLB    Neisseria meningitidis NOT DETECTED NOT DETECTED Final   Pseudomonas aeruginosa NOT DETECTED NOT DETECTED Final   Stenotrophomonas maltophilia NOT DETECTED NOT DETECTED Final   Candida albicans NOT DETECTED NOT DETECTED Final   Candida auris NOT DETECTED NOT DETECTED Final   Candida glabrata NOT DETECTED NOT DETECTED Final   Candida krusei NOT DETECTED NOT DETECTED Final   Candida parapsilosis NOT DETECTED NOT DETECTED Final   Candida tropicalis NOT DETECTED NOT DETECTED Final   Cryptococcus neoformans/gattii NOT DETECTED NOT DETECTED Final    Comment: Performed at [facility], [address] [place], [place] [address]  Blood Culture (routine x 2)     Status: None (Preliminary result)   Collection Time: 05/08/21  8:10 PM   Specimen: BLOOD  Result Value Ref Range Status   Specimen Description BLOOD  BLOOD LEFT HAND  Final   Special Requests   Final    BOTTLES DRAWN AEROBIC AND ANAEROBIC Blood Culture adequate volume   Culture   Final    NO GROWTH 3 DAYS Performed at [facility], [address] [place], [place] [address]    Report Status PENDING  Incomplete  Respiratory (~20 pathogens) panel by PCR     Status: None   Collection Time: 05/09/21  1:32 PM   Specimen: Nasopharyngeal Swab; Respiratory  Result Value Ref Range Status   Adenovirus NOT DETECTED NOT DETECTED Final   Coronavirus 229E NOT DETECTED NOT DETECTED Final    Comment: (NOTE) The Coronavirus on the Respiratory Panel, DOES NOT test for the novel  Coronavirus (2019 nCoV)    Coronavirus HKU1 NOT DETECTED NOT DETECTED Final   Coronavirus NL63 NOT DETECTED NOT DETECTED Final   Coronavirus OC43 NOT DETECTED NOT DETECTED Final   Metapneumovirus NOT DETECTED NOT DETECTED Final   Rhinovirus / Enterovirus NOT DETECTED NOT DETECTED Final   Influenza A NOT DETECTED NOT DETECTED Final   Influenza B NOT DETECTED NOT DETECTED Final   Parainfluenza Virus 1 NOT DETECTED NOT DETECTED Final   Parainfluenza Virus 2 NOT DETECTED NOT DETECTED Final   Parainfluenza Virus 3 NOT DETECTED NOT DETECTED Final   Parainfluenza Virus 4 NOT DETECTED NOT DETECTED Final   Respiratory Syncytial Virus NOT DETECTED NOT DETECTED Final   Bordetella pertussis NOT DETECTED NOT DETECTED Final   Bordetella Parapertussis NOT DETECTED NOT DETECTED Final   Chlamydophila pneumoniae NOT DETECTED NOT DETECTED Final   Mycoplasma pneumoniae NOT DETECTED NOT DETECTED Final    Comment: Performed at [facility], [place], [place],  [address]         Radiology Studies: No results found.      Scheduled Meds:  aspirin EC  81 mg Oral Daily   atorvastatin  20 mg Oral Daily   azithromycin  500 mg Oral Daily   dexamethasone  6 mg Oral Q breakfast   enoxaparin (LOVENOX) injection  30 mg Subcutaneous Q24H   guaiFENesin  600 mg  Oral BID   PARoxetine  10 mg Oral Daily   Continuous Infusions:  cefTRIAXone (ROCEPHIN)  IV 2 g (05/11/21 0174)   remdesivir 100 mg in NS 100 mL 100 mg (05/11/21 0827)     LOS: 3 days  Time spent: 25 mins    Wyvonnia Dusky, MD [facility] Pager [phone]  If 7PM-7AM, please contact night-coverage 05/11/2021, 1:03 PM

## 2021-05-12 ENCOUNTER — Other Ambulatory Visit: Payer: Self-pay | Admitting: Internal Medicine

## 2021-05-12 DIAGNOSIS — R7881 Bacteremia: Secondary | ICD-10-CM | POA: Diagnosis not present

## 2021-05-12 DIAGNOSIS — D75839 Thrombocytosis, unspecified: Secondary | ICD-10-CM | POA: Diagnosis not present

## 2021-05-12 DIAGNOSIS — U071 COVID-19: Secondary | ICD-10-CM | POA: Diagnosis not present

## 2021-05-12 LAB — COMPREHENSIVE METABOLIC PANEL
ALT: 24 U/L (ref 0–44)
AST: 22 U/L (ref 15–41)
Albumin: 2.6 g/dL — ABNORMAL LOW (ref 3.5–5.0)
Alkaline Phosphatase: 51 U/L (ref 38–126)
Anion gap: 5 (ref 5–15)
BUN: 18 mg/dL (ref 8–23)
CO2: 28 mmol/L (ref 22–32)
Calcium: 7.6 mg/dL — ABNORMAL LOW (ref 8.9–10.3)
Chloride: 104 mmol/L (ref 98–111)
Creatinine, Ser: 0.5 mg/dL (ref 0.44–1.00)
GFR, Estimated: >60 mL/min (ref >60)
Glucose, Bld: 109 mg/dL — ABNORMAL HIGH (ref 70–99)
Potassium: 3.8 mmol/L (ref 3.5–5.1)
Sodium: 137 mmol/L (ref 135–145)
Total Bilirubin: 0.6 mg/dL (ref 0.3–1.2)
Total Protein: 5.1 g/dL — ABNORMAL LOW (ref 6.5–8.1)

## 2021-05-12 LAB — CBC
HCT: 36.6 % (ref 36.0–46.0)
Hemoglobin: 12 g/dL (ref 12.0–15.0)
MCH: 30.5 pg (ref 26.0–34.0)
MCHC: 32.8 g/dL (ref 30.0–36.0)
MCV: 93.1 fL (ref 80.0–100.0)
Platelets: 405 10*3/uL — ABNORMAL HIGH (ref 150–400)
RBC: 3.93 MIL/uL (ref 3.87–5.11)
RDW: 13.4 % (ref 11.5–15.5)
WBC: 17.2 10*3/uL — ABNORMAL HIGH (ref 4.0–10.5)
nRBC: 0 % (ref 0.0–0.2)

## 2021-05-12 LAB — C-REACTIVE PROTEIN: CRP: 1.2 mg/dL — ABNORMAL HIGH (ref <1.0)

## 2021-05-12 MED ORDER — AMOXICILLIN-POT CLAVULANATE 875-125 MG PO TABS
1.0000 | ORAL_TABLET | Freq: Two times a day (BID) | ORAL | Status: DC
  Administered 2021-05-13 – 2021-05-15 (×5): 1 via ORAL
  Filled 2021-05-12 (×5): qty 1

## 2021-05-12 NOTE — Progress Notes (Signed)
PROGRESS NOTE    Kimberly Schmitt  KZL:[id number] DOB: 1951-01-15 DOA: 05/08/2021 PCP: Crecencio Mc, MD  Assessment & Plan:   Principal Problem:   CAP (community acquired pneumonia) Active Problems:   Tobacco abuse   Hyperlipidemia   GAD (generalized anxiety disorder)   COVID-19 virus infection   Sepsis ([place])  COVID19 pneumonia: completed remdesivir course. Continue on steroids, bronchodilators and encourage incentive spirometry   Superimposed bacterial pneumonia: Changed abxs to augmentin & continue on steroids, bronchodilators. Encourage incentive spirometry  Bacteremia: blood cx growing haemophilus influenzae, sens pending. Continue on abxs. Repeat blood cxs ordered  Acute hypoxic respiratory failure: likely secondary to above. Found to be 88% on RA on 05/08/21. Continue on supplemental oxygen and wean as tolerated  Leukocytosis: likely secondary to steroid use & infection   Smoker: received smoking cessation counseling  HLD: continue on statin    Anxiety: severity unknown. Ativan prn   Depression: severity unknown. Continue on home dose of paroxetine   Thrombocytosis: etiology unclear. Will continue to monitor  DVT prophylaxis: lovenox  Code Status: DNR Family Communication:  Disposition Plan: likely d/c back home   Level of care: Med-Surg  Status is: Inpatient  Remains inpatient appropriate because: severity of illness     Consultants:    Procedures:    Antimicrobials: augmentin    Subjective: Pt c/o malaise   Objective: Vitals:   05/11/21 0753 05/11/21 2049 05/12/21 0118 05/12/21 0524  BP: 138/80 (!) 147/85 133/73 135/85  Pulse: 97 89 (!) 51 87  Resp: 20 16 20 18   Temp: 98 F (36.7 C) 98 F (36.7 C) 97.6 F (36.4 C) 98.2 F (36.8 C)  TempSrc: Oral Oral Oral Oral  SpO2: 96% 98% 98% 95%  Weight:      Height:        Intake/Output Summary (Last 24 hours) at 05/12/2021 0727 Last data filed at 05/11/2021 1736 Gross per 24 hour  Intake  1824.06 ml  Output --  Net 1824.06 ml   Filed Weights   05/08/21 1326  Weight: 42.5 kg    Examination:  General exam: appears calm & comfortable  Respiratory system: decreased breath sounds b/l  Cardiovascular system: S1 & S2+. No rubs or gallops Gastrointestinal system: abd is soft, NT, ND & normal bowel sounds Central nervous system: Alert and oriented. Moves all extremities  Psychiatry: judgement and insight appears normal. Flat mood and affect    Data Reviewed: I have personally reviewed following labs and imaging studies  CBC: Recent Labs  Lab 05/08/21 1329 05/09/21 0724 05/10/21 0517 05/11/21 0658 05/12/21 0425  WBC 13.9* 14.5* 19.8* 19.3* 17.2*  NEUTROABS 12.4*  --   --   --   --   HGB 11.9* 12.1 12.1 11.7* 12.0  HCT 37.1 37.4 38.5 35.9* 36.6  MCV 95.1 95.7 94.8 94.5 93.1  PLT 270 293 346 387 390*   Basic Metabolic Panel: Recent Labs  Lab 05/08/21 1329 05/09/21 0724 05/10/21 0517 05/11/21 0658 05/12/21 0425  NA 141 140 142 139 137  K 3.2* 4.2 4.0 3.8 3.8  CL 105 105 108 103 104  CO2 29 28 29 28 28   GLUCOSE 146* 123* 130* 104* 109*  BUN 16 15 28* 23 18  CREATININE 0.66 0.50 0.54 0.52 0.50  CALCIUM 8.1* 7.5* 7.7* 7.8* 7.6*  MG 2.1  --  2.8*  --   --    GFR: Estimated Creatinine Clearance: 42.6 mL/min (by C-G formula based on SCr of 0.5 mg/dL). Liver  Function Tests: Recent Labs  Lab 05/09/21 0724 05/10/21 0517 05/11/21 0658 05/12/21 0425  AST 32 24 23 22   ALT 26 25 24 24   ALKPHOS 57 61 58 51  BILITOT 0.4 0.6 0.5 0.6  PROT 5.8* 5.7* 5.5* 5.1*  ALBUMIN 3.0* 2.8* 2.7* 2.6*   No results for input(s): LIPASE, AMYLASE in the last 168 hours. No results for input(s): AMMONIA in the last 168 hours. Coagulation Profile: Recent Labs  Lab 05/09/21 0724  INR 1.1   Cardiac Enzymes: No results for input(s): CKTOTAL, CKMB, CKMBINDEX, TROPONINI in the last 168 hours. BNP (last 3 results) No results for input(s): PROBNP in the last 8760  hours. HbA1C: No results for input(s): HGBA1C in the last 72 hours. CBG: No results for input(s): GLUCAP in the last 168 hours. Lipid Profile: No results for input(s): CHOL, HDL, LDLCALC, TRIG, CHOLHDL, LDLDIRECT in the last 72 hours. Thyroid Function Tests: No results for input(s): TSH, T4TOTAL, FREET4, T3FREE, THYROIDAB in the last 72 hours.  Anemia Panel: No results for input(s): VITAMINB12, FOLATE, FERRITIN, TIBC, IRON, RETICCTPCT in the last 72 hours. Sepsis Labs: Recent Labs  Lab 05/08/21 1329 05/08/21 1854 05/08/21 2010 05/09/21 0724 05/09/21 1442 05/10/21 0517  PROCALCITON 0.22  --   --  1.88   1.93  --  1.60  LATICACIDVEN  --  1.2 1.3 1.3 0.9  --     Recent Results (from the past 240 hour(s))  Blood Culture (routine x 2)     Status: Abnormal (Preliminary result)   Collection Time: 05/08/21  1:57 PM   Specimen: BLOOD  Result Value Ref Range Status   Specimen Description   Final    BLOOD RIGHT ANTECUBITAL Performed at [facility], [address], [place], [place] [address]    Special Requests   Final    BOTTLES DRAWN AEROBIC AND ANAEROBIC Blood Culture adequate volume Performed at [facility], [address], [place], [place] [address]    Culture  Setup Time   Final    GRAM NEGATIVE RODS IN BOTH AEROBIC AND ANAEROBIC BOTTLES Organism ID to follow CRITICAL RESULT CALLED TO, READ BACK BY AND VERIFIED WITH: NATHAN BELUE AT 0100 05/10/2021 DLB Performed at [facility], [address] [place], [place] [address]    Culture (A)  Final    HAEMOPHILUS INFLUENZAE BETA LACTAMASE POSITIVE HEALTH DEPARTMENT NOTIFIED Performed at [facility], [place] [address], [place],  [address]    Report Status PENDING  Incomplete  Blood Culture ID Panel (Reflexed)     Status: Abnormal   Collection Time: 05/08/21  1:57 PM  Result Value Ref Range Status   Enterococcus faecalis NOT DETECTED NOT DETECTED Final   Enterococcus Faecium NOT  DETECTED NOT DETECTED Final   Listeria monocytogenes NOT DETECTED NOT DETECTED Final   Staphylococcus species NOT DETECTED NOT DETECTED Final   Staphylococcus aureus (BCID) NOT DETECTED NOT DETECTED Final   Staphylococcus epidermidis NOT DETECTED NOT DETECTED Final   Staphylococcus lugdunensis NOT DETECTED NOT DETECTED Final   Streptococcus species NOT DETECTED NOT DETECTED Final   Streptococcus agalactiae NOT DETECTED NOT DETECTED Final   Streptococcus pneumoniae NOT DETECTED NOT DETECTED Final   Streptococcus pyogenes NOT DETECTED NOT DETECTED Final   A.calcoaceticus-baumannii NOT DETECTED NOT DETECTED Final   Bacteroides fragilis NOT DETECTED NOT DETECTED Final   Enterobacterales NOT DETECTED NOT DETECTED Final   Enterobacter cloacae complex NOT DETECTED NOT DETECTED Final   Escherichia coli NOT DETECTED NOT DETECTED Final  Klebsiella aerogenes NOT DETECTED NOT DETECTED Final   Klebsiella oxytoca NOT DETECTED NOT DETECTED Final   Klebsiella pneumoniae NOT DETECTED NOT DETECTED Final   Proteus species NOT DETECTED NOT DETECTED Final   Salmonella species NOT DETECTED NOT DETECTED Final   Serratia marcescens NOT DETECTED NOT DETECTED Final   Haemophilus influenzae DETECTED (A) NOT DETECTED Final    Comment: CRITICAL RESULT CALLED TO, READ BACK BY AND VERIFIED WITH: NATHAN BELUE AT 0100 05/10/2021 DLB    Neisseria meningitidis NOT DETECTED NOT DETECTED Final   Pseudomonas aeruginosa NOT DETECTED NOT DETECTED Final   Stenotrophomonas maltophilia NOT DETECTED NOT DETECTED Final   Candida albicans NOT DETECTED NOT DETECTED Final   Candida auris NOT DETECTED NOT DETECTED Final   Candida glabrata NOT DETECTED NOT DETECTED Final   Candida krusei NOT DETECTED NOT DETECTED Final   Candida parapsilosis NOT DETECTED NOT DETECTED Final   Candida tropicalis NOT DETECTED NOT DETECTED Final   Cryptococcus neoformans/gattii NOT DETECTED NOT DETECTED Final    Comment: Performed at [facility], [address] [place], [place] [address]  Blood Culture (routine x 2)     Status: None (Preliminary result)   Collection Time: 05/08/21  8:10 PM   Specimen: BLOOD  Result Value Ref Range Status   Specimen Description BLOOD BLOOD LEFT HAND  Final   Special Requests   Final    BOTTLES DRAWN AEROBIC AND ANAEROBIC Blood Culture adequate volume   Culture   Final    NO GROWTH 3 DAYS Performed at [facility], [address] [place], [place] [address]    Report Status PENDING  Incomplete  Respiratory (~20 pathogens) panel by PCR     Status: None   Collection Time: 05/09/21  1:32 PM   Specimen: Nasopharyngeal Swab; Respiratory  Result Value Ref Range Status   Adenovirus NOT DETECTED NOT DETECTED Final   Coronavirus 229E NOT DETECTED NOT DETECTED Final    Comment: (NOTE) The Coronavirus on the Respiratory Panel, DOES NOT test for the novel  Coronavirus (2019 nCoV)    Coronavirus HKU1 NOT DETECTED NOT DETECTED Final   Coronavirus NL63 NOT DETECTED NOT DETECTED Final   Coronavirus OC43 NOT DETECTED NOT DETECTED Final   Metapneumovirus NOT DETECTED NOT DETECTED Final   Rhinovirus / Enterovirus NOT DETECTED NOT DETECTED Final   Influenza A NOT DETECTED NOT DETECTED Final   Influenza B NOT DETECTED NOT DETECTED Final   Parainfluenza Virus 1 NOT DETECTED NOT DETECTED Final   Parainfluenza Virus 2 NOT DETECTED NOT DETECTED Final   Parainfluenza Virus 3 NOT DETECTED NOT DETECTED Final   Parainfluenza Virus 4 NOT DETECTED NOT DETECTED Final   Respiratory Syncytial Virus NOT DETECTED NOT DETECTED Final   Bordetella pertussis NOT DETECTED NOT DETECTED Final   Bordetella Parapertussis NOT DETECTED NOT DETECTED Final   Chlamydophila pneumoniae NOT DETECTED NOT DETECTED Final   Mycoplasma pneumoniae NOT DETECTED NOT DETECTED Final    Comment: Performed at [facility], [place]. [address] [place], [place] [address]         Radiology Studies: No results  found.      Scheduled Meds:  aspirin EC  81 mg Oral Daily   atorvastatin  20 mg Oral Daily   azithromycin  500 mg Oral Daily   dexamethasone  6 mg Oral Q breakfast   enoxaparin (LOVENOX) injection  30 mg Subcutaneous Q24H   guaiFENesin  600 mg Oral BID   PARoxetine  10 mg Oral  Daily   Continuous Infusions:  cefTRIAXone (ROCEPHIN)  IV 2 g (05/12/21 0519)   remdesivir 100 mg in NS 100 mL Stopped (05/11/21 0907)     LOS: 4 days    Time spent: 15 mins    Wyvonnia Dusky, MD [facility] Pager [phone]  If 7PM-7AM, please contact night-coverage 05/12/2021, 7:27 AM

## 2021-05-12 NOTE — TOC Initial Note (Signed)
Transition of Care [facility]) - Initial/Assessment Note    Patient Details  Name: Kimberly Schmitt MRN: [id number] Date of Birth: 12-17-1950  Transition of Care [facility] CM/SW Contact:    Candie Chroman, LCSW Phone Number: 05/12/2021, 3:41 PM  Clinical Narrative:  Called patient in the room, introduced role, and explained that PT recommendations would be discussed. Patient is agreeable to home health. Prefers [facility] because they worked with her husband. Referral made and accepted for PT. Patient is on acute oxygen. Will follow for this need as well. No further concerns. CSW encouraged patient to contact CSW as needed. CSW will continue to follow patient for support and facilitate return home at discharge.                Expected Discharge Plan: [place] Barriers to Discharge: Continued Medical Work up   Patient Goals and CMS Choice     Choice offered to / list presented to : Patient  Expected Discharge Plan and Services Expected Discharge Plan: [place] Acute Care Choice: [place] arrangements for the past 2 months: [place]: PT [place]: [place] ([place]) Date [place]: 05/12/21   Representative spoke with at [place]: Floydene Flock  Prior Living Arrangements/Services Living arrangements for the past 2 months: Single Family Home Lives with:: Self Patient language and need for interpreter reviewed:: Yes Do you feel safe going back to the place where you live?: Yes      Need for Family Participation in Patient Care: Yes (Comment) Care giver support system in place?: Yes (comment)   Criminal Activity/Legal Involvement Pertinent to Current Situation/Hospitalization: No - Comment as needed  Activities of Daily Living Home Assistive Devices/Equipment: None ADL Screening (condition at time of admission) Patient's cognitive ability  adequate to safely complete daily activities?: Yes Is the patient deaf or have difficulty hearing?: No Does the patient have difficulty seeing, even when wearing glasses/contacts?: No Does the patient have difficulty concentrating, remembering, or making decisions?: No Patient able to express need for assistance with ADLs?: Yes Does the patient have difficulty dressing or bathing?: No Independently performs ADLs?: Yes (appropriate for developmental age) Does the patient have difficulty walking or climbing stairs?: No Weakness of Legs: None Weakness of Arms/Hands: None  Permission Sought/Granted Permission sought to share information with : Facility Art therapist granted to share information with : Yes, Verbal Permission Granted     Permission granted to share info w AGENCY: [facility]        Emotional Assessment Appearance:: Appears stated age Attitude/Demeanor/Rapport: Engaged, Gracious Affect (typically observed): Accepting, Appropriate, Calm, Pleasant Orientation: : Oriented to Self, Oriented to Place, Oriented to  Time, Oriented to Situation Alcohol / Substance Use: Not Applicable Psych Involvement: No (comment)  Admission diagnosis:  Dehydration [E86.0] Hypokalemia [E87.6] SVT (supraventricular tachycardia) (HCC) [I47.1] CAP (community acquired pneumonia) [J18.9] SIRS (systemic inflammatory response syndrome) (HCC) [R65.10] Acute respiratory failure with hypoxia (HCC) [J96.01] Community acquired pneumonia of left lower lobe of lung [J18.9] Sepsis, due to unspecified organism, unspecified whether acute organ dysfunction present ([place]) [A41.9] COVID [U07.1] Patient Active Problem List   Diagnosis Date Noted   CAP (community acquired pneumonia) 05/08/2021   COVID-19 virus infection 05/08/2021   Sepsis ([place])  05/08/2021   Low back pain 01/18/2021   Aortic atherosclerosis ([place]) 01/18/2021   Genetic testing 10/10/2020   Family history of  pancreatic cancer    Family history of colon cancer    Family history of breast cancer    Urticaria 01/16/2020   Osteoporosis 07/29/2019   GAD (generalized anxiety disorder) 02/20/2018   Insomnia secondary to anxiety 12/18/2017   Atherosclerosis of native coronary artery of native heart without angina pectoris 05/16/2016   Solitary pulmonary nodule 05/16/2016   Vitamin D deficiency 05/16/2016   Hyperlipidemia 04/21/2013   Screening for colon cancer 04/08/2011   Encounter for preventive health examination 04/08/2011   Screening for cervical cancer 04/08/2011   Tobacco abuse 04/07/2011   Tobacco abuse counseling 04/07/2011   PCP:  Crecencio Mc, MD Pharmacy:   [id number] [address], [place] [address] Phone: [phone] Fax: [phone]     Social Determinants of Health (SDOH) Interventions    Readmission Risk Interventions No flowsheet data found.

## 2021-05-12 NOTE — TOC CM/SW Note (Signed)
Tried calling patient in the room to discuss home health recommendations but no answer. Will try again later.  Dayton Scrape, [place]

## 2021-05-12 NOTE — Evaluation (Signed)
Physical Therapy Evaluation Patient Details Name: Kimberly Schmitt MRN: [id number] DOB: Sep 20, 1950 Today's Date: 05/12/2021  History of Present Illness  Patient is a 71 year old female who presents to EMS from home with SOB. Patient recently diagnosed with COVID on 05/05/21. Per EMS pt's HR was in the 180s-190s with SVT. Patient given 6 mg of adenosine and 500 cc of saline. Patient initially hypoxic on room air with SP02 85%. PMH includes osteoporosis,GAD, insomnia, HLD, tobacco abuse.   Clinical Impression  Patient is a pleasant 71 year old female who presents with generalized weakness s/p COVID. Prior to hospital admission, pt was independent working 30+ hours a week, walking 30 minutes a day with her dog, and lives alone. Patient's son is able to come stay with her after discharge per patient and granddaughter, granddaughter lives 5 minutes away. Patient in bed upon PT arrival and eager to participate with physical therapy. Is able to sit EOB without LOB and perform seated strengthening interventions. Sit to stand with transition to chair performed with patient reporting fatigue, after rest break with education on safety and purse lipped breathing patient able to tolerate ambulation within room. Additional seated interventions performed once sitting in chair. Oxygen decreased to 1 L due to SP02 remaining >96%.   Pt would benefit from skilled PT to address noted impairments and functional limitations (see below for any additional details).  Upon hospital discharge, pt would benefit from HHPT and intermittent assistance.        Recommendations for follow up therapy are one component of a multi-disciplinary discharge planning process, led by the attending physician.  Recommendations may be updated based on patient status, additional functional criteria and insurance authorization.  Follow Up Recommendations Home health PT    Assistance Recommended at Discharge Set up Supervision/Assistance   Functional Status Assessment Patient has had a recent decline in their functional status and demonstrates the ability to make significant improvements in function in a reasonable and predictable amount of time.  Equipment Recommendations   (may potentially need cane, will assess further next session.)    Recommendations for Other Services       Precautions / Restrictions Precautions Precautions: Fall Restrictions Weight Bearing Restrictions: No      Mobility  Bed Mobility Overal bed mobility: Independent             General bed mobility comments: transitions without use of UE's    Transfers Overall transfer level: Modified independent                 General transfer comment: needs additional time to transition from sit to stand as patient reports history of dizziness.    Ambulation/Gait Ambulation/Gait assistance: Min guard Gait Distance (Feet): 40 Feet   Gait Pattern/deviations: Step-through pattern;Decreased stride length;Narrow base of support Gait velocity: decreased     General Gait Details: fatigues quickly requiring one wall lean with RR> 30, patient reports walking feels good.  Stairs            Wheelchair Mobility    Modified Rankin (Stroke Patients Only)       Balance Overall balance assessment: Needs assistance Sitting-balance support: No upper extremity supported Sitting balance-Leahy Scale: Good Sitting balance - Comments: static sit EOB with good stability   Standing balance support: No upper extremity supported;During functional activity Standing balance-Leahy Scale: Fair Standing balance comment: fatigue requires one wall lean during ambulation               High Level  Balance Comments: fatigue limits high level balance testing             Pertinent Vitals/Pain Pain Assessment: No/denies pain    Home Living Family/patient expects to be discharged to:: Private residence Living Arrangements: Alone;Other  (Comment) (Son will come and stay with her as long as needed.) Available Help at Discharge: Family (children and granddaughter live nearby. son will come and stay with her as long as she needs.)   Home Access: Stairs to enter   Entrance Stairs-Number of Steps: 2   Home Layout: One level Home Equipment: Grab bars - tub/shower Additional Comments: Patient's son will come and stay with her for as long as needed after discharge. Family lives close by, granddaughter present and states she lives 5 minutes away    Prior Function Prior Level of Function : Independent/Modified Independent             Mobility Comments: Patient works 30+ hours a week, walks her dog 30 minutes a day. ADLs Comments: ind.     Hand Dominance        Extremity/Trunk Assessment   Upper Extremity Assessment Upper Extremity Assessment: Overall WFL for tasks assessed    Lower Extremity Assessment Lower Extremity Assessment: Overall WFL for tasks assessed (Grossly 4/5 bilaterally)    Cervical / Trunk Assessment Cervical / Trunk Assessment: Normal  Communication   Communication: No difficulties  Cognition Arousal/Alertness: Awake/alert Behavior During Therapy: WFL for tasks assessed/performed Overall Cognitive Status: Within Functional Limits for tasks assessed                                 General Comments: A and O x 4        General Comments General comments (skin integrity, edema, etc.): patient appears well nourished and groomed.    Exercises General Exercises - Lower Extremity Ankle Circles/Pumps: Strengthening;Both;10 reps;Seated Long Arc Quad: Strengthening;Both;10 reps;Seated Hip Flexion/Marching: Strengthening;Both;10 reps;Seated Other Exercises Other Exercises: Patient educated on PT role in acute care, safe mobility and transfers, chair exercises   Assessment/Plan    PT Assessment Patient needs continued PT services  PT Problem List Decreased strength;Decreased  activity tolerance;Decreased balance;Decreased mobility;Cardiopulmonary status limiting activity       PT Treatment Interventions DME instruction;Gait training;Stair training;Functional mobility training;Therapeutic activities;Patient/family education;Cognitive remediation;Neuromuscular re-education;Balance training;Therapeutic exercise    PT Goals (Current goals can be found in the Care Plan section)  Acute Rehab PT Goals Patient Stated Goal: to return home PT Goal Formulation: With patient Time For Goal Achievement: 05/26/21 Potential to Achieve Goals: Fair    Frequency Min 2X/week   Barriers to discharge   would benefit from HHPT    Co-evaluation               AM-PAC PT "6 Clicks" Mobility  Outcome Measure Help needed turning from your back to your side while in a flat bed without using bedrails?: None Help needed moving from lying on your back to sitting on the side of a flat bed without using bedrails?: None Help needed moving to and from a bed to a chair (including a wheelchair)?: A Little Help needed standing up from a chair using your arms (e.g., wheelchair or bedside chair)?: A Little Help needed to walk in hospital room?: A Little Help needed climbing 3-5 steps with a railing? : A Lot 6 Click Score: 19    End of Session Equipment Utilized During Treatment: Gait belt;Oxygen (1-2  L via nasal cannula) Activity Tolerance: Patient tolerated treatment well;Patient limited by fatigue Patient left: in chair;with call bell/phone within reach;with chair alarm set;with family/visitor present Nurse Communication: Mobility status PT Visit Diagnosis: Unsteadiness on feet (R26.81);Other abnormalities of gait and mobility (R26.89);Muscle weakness (generalized) (M62.81);Difficulty in walking, not elsewhere classified (R26.2)    Time: 8871-9597 PT Time Calculation (min) (ACUTE ONLY): 41 min   Charges:   PT Evaluation $PT Eval Moderate Complexity: 1 Mod PT Treatments $Gait  Training: 8-22 mins $Therapeutic Activity: 23-37 mins        Janna Arch, PT, DPT  05/12/2021, 12:28 PM

## 2021-05-13 DIAGNOSIS — R7881 Bacteremia: Secondary | ICD-10-CM | POA: Diagnosis not present

## 2021-05-13 DIAGNOSIS — J189 Pneumonia, unspecified organism: Secondary | ICD-10-CM | POA: Diagnosis not present

## 2021-05-13 DIAGNOSIS — U071 COVID-19: Secondary | ICD-10-CM | POA: Diagnosis not present

## 2021-05-13 LAB — CBC
HCT: 44.3 % (ref 36.0–46.0)
Hemoglobin: 14.2 g/dL (ref 12.0–15.0)
MCH: 29.8 pg (ref 26.0–34.0)
MCHC: 32.1 g/dL (ref 30.0–36.0)
MCV: 93.1 fL (ref 80.0–100.0)
Platelets: 509 10*3/uL — ABNORMAL HIGH (ref 150–400)
RBC: 4.76 MIL/uL (ref 3.87–5.11)
RDW: 13.2 % (ref 11.5–15.5)
WBC: 21.3 10*3/uL — ABNORMAL HIGH (ref 4.0–10.5)
nRBC: 0 % (ref 0.0–0.2)

## 2021-05-13 LAB — COMPREHENSIVE METABOLIC PANEL
ALT: 35 U/L (ref 0–44)
AST: 32 U/L (ref 15–41)
Albumin: 3.2 g/dL — ABNORMAL LOW (ref 3.5–5.0)
Alkaline Phosphatase: 62 U/L (ref 38–126)
Anion gap: 7 (ref 5–15)
BUN: 18 mg/dL (ref 8–23)
CO2: 30 mmol/L (ref 22–32)
Calcium: 8 mg/dL — ABNORMAL LOW (ref 8.9–10.3)
Chloride: 100 mmol/L (ref 98–111)
Creatinine, Ser: 0.67 mg/dL (ref 0.44–1.00)
GFR, Estimated: >60 mL/min (ref >60)
Glucose, Bld: 110 mg/dL — ABNORMAL HIGH (ref 70–99)
Potassium: 4 mmol/L (ref 3.5–5.1)
Sodium: 137 mmol/L (ref 135–145)
Total Bilirubin: 0.8 mg/dL (ref 0.3–1.2)
Total Protein: 6.3 g/dL — ABNORMAL LOW (ref 6.5–8.1)

## 2021-05-13 LAB — CULTURE, BLOOD (ROUTINE X 2)
Culture: NO GROWTH
Special Requests: ADEQUATE

## 2021-05-13 LAB — GLUCOSE, CAPILLARY: Glucose-Capillary: 196 mg/dL — ABNORMAL HIGH (ref 70–99)

## 2021-05-13 LAB — C-REACTIVE PROTEIN: CRP: 1 mg/dL — ABNORMAL HIGH (ref <1.0)

## 2021-05-13 MED ORDER — MENTHOL 3 MG MT LOZG
1.0000 | LOZENGE | OROMUCOSAL | Status: DC | PRN
  Filled 2021-05-13 (×2): qty 9

## 2021-05-13 NOTE — Progress Notes (Signed)
PROGRESS NOTE    HPI was taken from Dr. Linda Hedges: Kimberly Schmitt is a 71 y.o. female with medical history significant of tobacco abuse, anxiety as well as recent diagnosis of COVID on 12/26 who presents EMS from home for assessment of shortness of breath.  Patient states she has been feeling poorly for about a week with body aches, cough, shortness of breath much worse with minimal exertion, some chest tightness with exertion and some right mid back pain that started today.  She has had a mild cough as well but no hemoptysis.  She endorses nausea and some nonbloody diarrhea but denies any vomiting or urinary symptoms.  She denies any current chest pain, abdominal pain, rash, extremity pain, headache earache, sore throat or other clear associated sick symptoms.        Per EMS on arrival patient's heart rate was in the 180s to 190s and twelve-lead ECG showed evidence of SVT.  Patient was given 6 mg of adenosine and following this was noted to have a heart rate in the 140s and on arrival to emergency room at 125.  Patient also given 500 cc of normal saline.  She was also noted to be hypoxic with EMS at 85% on room air.  She was transferred on supplemental oxygen and placed on 2 L on arrival with a room air SPO2 of 88%.   Note by EDP with review and agreement by admitting MD.    ED Course: T 99.7  127/88  HR 168max to 100, RR 20. Lab with K 3.2, Glucose 146, Procalcitonin 0.22, WBC 13.9 with 90/4/6. UA negative. CTA no PE, patchy nodular infiltrates c/w covid PNA, chronic emphysema. Patient meets criteria for sepsis with fever, tachypnea, tachycardia, leukocytosis    As per Dr. Erlinda Hong:  Kimberly Schmitt is a 71 y.o. female with medical history significant of tobacco abuse, anxiety as well as recent diagnosis of COVID on 12/26 who presents EMS from home for assessment of shortness of breath    Kimberly Schmitt is a 71 y.o. female with medical history significant of tobacco abuse, anxiety as well as  recent diagnosis of COVID on 12/26 who presents EMS from home for assessment of shortness of breath   As per Dr. Jimmye Norman 05/10/21-05/13/21: Pt is still on supplemental oxygen, still has sore throat and wheezing. Pt has completed remdesivir course already but still on steroids & bronchodilators. Of note, pt was found to have bacteremia and has been on abxs. Repeat blood cxs NGTD. Will likely need a couple more days inpatient.     Kimberly Schmitt  NOM:[id number] DOB: 07/13/1950 DOA: 05/08/2021 PCP: Crecencio Mc, MD  Assessment & Plan:   Principal Problem:   CAP (community acquired pneumonia) Active Problems:   Tobacco abuse   Hyperlipidemia   GAD (generalized anxiety disorder)   COVID-19 virus infection   Sepsis ([place])  COVID19 pneumonia: completed remdesivir course. Continue on steroids, bronchodilators and encourage incentive spirometry   Superimposed bacterial pneumonia: continue on augmentin, bronchodilators, steroids & encourage incentive spirometry   Bacteremia: blood cx growing haemophilus influenzae, sens pending still. Continue on augmentin. Repeat blood cxs NGTD  Acute hypoxic respiratory failure: likely secondary to above. Found to be 88% on RA on 05/08/21. Continue on supplemental oxygen and wean as tolerated   Leukocytosis: likely secondary to infection and steroid use   Smoker: received smoking cessation counseling  HLD: continue on statin     Anxiety: severity unknown. Ativan prn   Depression: severity  unknown. Continue on home dose of paroxetine   Thrombocytosis: etiology unclear. Will continue to monitor   DVT prophylaxis: lovenox  Code Status: DNR Family Communication: discussed pt's care w/ pt's family at bedside and answered their questions  Disposition Plan: likely d/c back home   Level of care: Med-Surg  Status is: Inpatient  Remains inpatient appropriate because: severity of illness     Consultants:    Procedures:    Antimicrobials:  augmentin    Subjective: Pt c/o fatigue and sore throat   Objective: Vitals:   05/12/21 1610 05/12/21 2110 05/13/21 0007 05/13/21 0552  BP: 122/74 (!) 146/95 (!) 138/94 (!) 150/91  Pulse: 86 98 93 91  Resp: 16 20 18 18   Temp: 98 F (36.7 C) 98.3 F (36.8 C) 97.9 F (36.6 C) 98.2 F (36.8 C)  TempSrc: Oral Oral Oral Oral  SpO2: 98% 98% 100% 100%  Weight:      Height:       No intake or output data in the 24 hours ending 05/13/21 0728  Filed Weights   05/08/21 1326  Weight: 42.5 kg    Examination:  General exam: appears comfortable  Respiratory system: diminished breath sounds b/l. Intermittent wheezes b/l  Cardiovascular system: S1/S2+. No rubs or clicks  Gastrointestinal system: abd is soft, NT, ND & hypoactive bowel sounds Central nervous system: alert and oriented. Moves all extremities   Psychiatry: judgement and insight appears normal. Flat mood and affect   Data Reviewed: I have personally reviewed following labs and imaging studies  CBC: Recent Labs  Lab 05/08/21 1329 05/09/21 0724 05/10/21 0517 05/11/21 0658 05/12/21 0425 05/13/21 0444  WBC 13.9* 14.5* 19.8* 19.3* 17.2* 21.3*  NEUTROABS 12.4*  --   --   --   --   --   HGB 11.9* 12.1 12.1 11.7* 12.0 14.2  HCT 37.1 37.4 38.5 35.9* 36.6 44.3  MCV 95.1 95.7 94.8 94.5 93.1 93.1  PLT 270 293 346 387 405* 811*   Basic Metabolic Panel: Recent Labs  Lab 05/08/21 1329 05/09/21 0724 05/10/21 0517 05/11/21 0658 05/12/21 0425 05/13/21 0444  NA 141 140 142 139 137 137  K 3.2* 4.2 4.0 3.8 3.8 4.0  CL 105 105 108 103 104 100  CO2 29 28 29 28 28 30   GLUCOSE 146* 123* 130* 104* 109* 110*  BUN 16 15 28* 23 18 18   CREATININE 0.66 0.50 0.54 0.52 0.50 0.67  CALCIUM 8.1* 7.5* 7.7* 7.8* 7.6* 8.0*  MG 2.1  --  2.8*  --   --   --    GFR: Estimated Creatinine Clearance: 42.6 mL/min (by C-G formula based on SCr of 0.67 mg/dL). Liver Function Tests: Recent Labs  Lab 05/09/21 0724 05/10/21 0517  05/11/21 0658 05/12/21 0425 05/13/21 0444  AST 32 24 23 22  32  ALT 26 25 24 24  35  ALKPHOS 57 61 58 51 62  BILITOT 0.4 0.6 0.5 0.6 0.8  PROT 5.8* 5.7* 5.5* 5.1* 6.3*  ALBUMIN 3.0* 2.8* 2.7* 2.6* 3.2*   No results for input(s): LIPASE, AMYLASE in the last 168 hours. No results for input(s): AMMONIA in the last 168 hours. Coagulation Profile: Recent Labs  Lab 05/09/21 0724  INR 1.1   Cardiac Enzymes: No results for input(s): CKTOTAL, CKMB, CKMBINDEX, TROPONINI in the last 168 hours. BNP (last 3 results) No results for input(s): PROBNP in the last 8760 hours. HbA1C: No results for input(s): HGBA1C in the last 72 hours. CBG: No results for input(s): GLUCAP  in the last 168 hours. Lipid Profile: No results for input(s): CHOL, HDL, LDLCALC, TRIG, CHOLHDL, LDLDIRECT in the last 72 hours. Thyroid Function Tests: No results for input(s): TSH, T4TOTAL, FREET4, T3FREE, THYROIDAB in the last 72 hours.  Anemia Panel: No results for input(s): VITAMINB12, FOLATE, FERRITIN, TIBC, IRON, RETICCTPCT in the last 72 hours. Sepsis Labs: Recent Labs  Lab 05/08/21 1329 05/08/21 1854 05/08/21 2010 05/09/21 0724 05/09/21 1442 05/10/21 0517  PROCALCITON 0.22  --   --  1.88   1.93  --  1.60  LATICACIDVEN  --  1.2 1.3 1.3 0.9  --     Recent Results (from the past 240 hour(s))  Blood Culture (routine x 2)     Status: Abnormal (Preliminary result)   Collection Time: 05/08/21  1:57 PM   Specimen: BLOOD  Result Value Ref Range Status   Specimen Description   Final    BLOOD RIGHT ANTECUBITAL Performed at [facility], [address], [place], [place] [address]    Special Requests   Final    BOTTLES DRAWN AEROBIC AND ANAEROBIC Blood Culture adequate volume Performed at [facility], [address], [place], [place] [address]    Culture  Setup Time   Final    GRAM NEGATIVE RODS IN BOTH AEROBIC AND ANAEROBIC BOTTLES Organism ID to follow CRITICAL RESULT CALLED TO, READ  BACK BY AND VERIFIED WITH: NATHAN BELUE AT 0100 05/10/2021 DLB Performed at [facility], [address], [place], [place] [address]    Culture (A)  Final    HAEMOPHILUS INFLUENZAE BETA LACTAMASE POSITIVE HEALTH DEPARTMENT NOTIFIED Performed at [facility], [place] [address] [place], [place] [address]    Report Status PENDING  Incomplete  Blood Culture ID Panel (Reflexed)     Status: Abnormal   Collection Time: 05/08/21  1:57 PM  Result Value Ref Range Status   Enterococcus faecalis NOT DETECTED NOT DETECTED Final   Enterococcus Faecium NOT DETECTED NOT DETECTED Final   Listeria monocytogenes NOT DETECTED NOT DETECTED Final   Staphylococcus species NOT DETECTED NOT DETECTED Final   Staphylococcus aureus (BCID) NOT DETECTED NOT DETECTED Final   Staphylococcus epidermidis NOT DETECTED NOT DETECTED Final   Staphylococcus lugdunensis NOT DETECTED NOT DETECTED Final   Streptococcus species NOT DETECTED NOT DETECTED Final   Streptococcus agalactiae NOT DETECTED NOT DETECTED Final   Streptococcus pneumoniae NOT DETECTED NOT DETECTED Final   Streptococcus pyogenes NOT DETECTED NOT DETECTED Final   A.calcoaceticus-baumannii NOT DETECTED NOT DETECTED Final   Bacteroides fragilis NOT DETECTED NOT DETECTED Final   Enterobacterales NOT DETECTED NOT DETECTED Final   Enterobacter cloacae complex NOT DETECTED NOT DETECTED Final   Escherichia coli NOT DETECTED NOT DETECTED Final   Klebsiella aerogenes NOT DETECTED NOT DETECTED Final   Klebsiella oxytoca NOT DETECTED NOT DETECTED Final   Klebsiella pneumoniae NOT DETECTED NOT DETECTED Final   Proteus species NOT DETECTED NOT DETECTED Final   Salmonella species NOT DETECTED NOT DETECTED Final   Serratia marcescens NOT DETECTED NOT DETECTED Final   Haemophilus influenzae DETECTED (A) NOT DETECTED Final    Comment: CRITICAL RESULT CALLED TO, READ BACK BY AND VERIFIED WITH: NATHAN BELUE AT 0100 05/10/2021 DLB    Neisseria meningitidis  NOT DETECTED NOT DETECTED Final   Pseudomonas aeruginosa NOT DETECTED NOT DETECTED Final   Stenotrophomonas maltophilia NOT DETECTED NOT DETECTED Final   Candida albicans NOT DETECTED NOT DETECTED Final   Candida auris NOT DETECTED NOT DETECTED Final   Candida  glabrata NOT DETECTED NOT DETECTED Final   Candida krusei NOT DETECTED NOT DETECTED Final   Candida parapsilosis NOT DETECTED NOT DETECTED Final   Candida tropicalis NOT DETECTED NOT DETECTED Final   Cryptococcus neoformans/gattii NOT DETECTED NOT DETECTED Final    Comment: Performed at [facility], [address] [place], [place] [address]  Blood Culture (routine x 2)     Status: None   Collection Time: 05/08/21  8:10 PM   Specimen: BLOOD  Result Value Ref Range Status   Specimen Description BLOOD BLOOD LEFT HAND  Final   Special Requests   Final    BOTTLES DRAWN AEROBIC AND ANAEROBIC Blood Culture adequate volume   Culture   Final    NO GROWTH 5 DAYS Performed at [facility], [address] [place], [place] [address]    Report Status 05/13/2021 FINAL  Final  Respiratory (~20 pathogens) panel by PCR     Status: None   Collection Time: 05/09/21  1:32 PM   Specimen: Nasopharyngeal Swab; Respiratory  Result Value Ref Range Status   Adenovirus NOT DETECTED NOT DETECTED Final   Coronavirus 229E NOT DETECTED NOT DETECTED Final    Comment: (NOTE) The Coronavirus on the Respiratory Panel, DOES NOT test for the novel  Coronavirus (2019 nCoV)    Coronavirus HKU1 NOT DETECTED NOT DETECTED Final   Coronavirus NL63 NOT DETECTED NOT DETECTED Final   Coronavirus OC43 NOT DETECTED NOT DETECTED Final   Metapneumovirus NOT DETECTED NOT DETECTED Final   Rhinovirus / Enterovirus NOT DETECTED NOT DETECTED Final   Influenza A NOT DETECTED NOT DETECTED Final   Influenza B NOT DETECTED NOT DETECTED Final   Parainfluenza Virus 1 NOT DETECTED NOT DETECTED Final   Parainfluenza Virus 2 NOT DETECTED NOT DETECTED Final    Parainfluenza Virus 3 NOT DETECTED NOT DETECTED Final   Parainfluenza Virus 4 NOT DETECTED NOT DETECTED Final   Respiratory Syncytial Virus NOT DETECTED NOT DETECTED Final   Bordetella pertussis NOT DETECTED NOT DETECTED Final   Bordetella Parapertussis NOT DETECTED NOT DETECTED Final   Chlamydophila pneumoniae NOT DETECTED NOT DETECTED Final   Mycoplasma pneumoniae NOT DETECTED NOT DETECTED Final    Comment: Performed at [facility], [place] [address] [place], [place] [address]  CULTURE, BLOOD (ROUTINE X 2) w Reflex to ID Panel     Status: None (Preliminary result)   Collection Time: 05/12/21  2:50 PM   Specimen: BLOOD  Result Value Ref Range Status   Specimen Description BLOOD RIGHT AC  Final   Special Requests   Final    BOTTLES DRAWN AEROBIC AND ANAEROBIC Blood Culture adequate volume   Culture   Final    NO GROWTH < 24 HOURS Performed at [facility], [address], [place], [place] [address]    Report Status PENDING  Incomplete  CULTURE, BLOOD (ROUTINE X 2) w Reflex to ID Panel     Status: None (Preliminary result)   Collection Time: 05/12/21  2:50 PM   Specimen: BLOOD  Result Value Ref Range Status   Specimen Description BLOOD RIGHT HAND  Final   Special Requests   Final    BOTTLES DRAWN AEROBIC AND ANAEROBIC Blood Culture adequate volume   Culture   Final    NO GROWTH < 24 HOURS Performed at [facility], [address] [place], [place] [address]    Report Status PENDING  Incomplete         Radiology Studies: No results  found.      Scheduled Meds:  amoxicillin-clavulanate  1 tablet Oral BID   aspirin EC  81 mg Oral Daily   atorvastatin  20 mg Oral Daily   dexamethasone  6 mg Oral Q breakfast   enoxaparin (LOVENOX) injection  30 mg Subcutaneous Q24H   guaiFENesin  600 mg Oral BID   PARoxetine  10 mg Oral Daily   Continuous Infusions:     LOS: 5 days    Time spent: 15 mins    Wyvonnia Dusky, MD [facility] Pager [phone]  If 7PM-7AM, please contact night-coverage 05/13/2021, 7:28 AM

## 2021-05-13 NOTE — Care Management Important Message (Signed)
Important Message  Patient Details  Name: Kimberly Schmitt MRN: [id number] Date of Birth: [birth year]   Medicare Important Message Given:  Yes  Patient is in an isolation room so I called her room [phone]) and reviewed the Important Message from Medicare with her. She is in agreement with the discharge plan and I asked if she would like me to send her a copy and she it wouldn't be necessary. Stated she was very pleased with her care here.  I wished her a speedy recovery and thanked her for her time.    Juliann Pulse A Byron Tipping 05/13/2021, 9:52 AM

## 2021-05-14 DIAGNOSIS — D75839 Thrombocytosis, unspecified: Secondary | ICD-10-CM

## 2021-05-14 DIAGNOSIS — A413 Sepsis due to Hemophilus influenzae: Principal | ICD-10-CM

## 2021-05-14 DIAGNOSIS — J9601 Acute respiratory failure with hypoxia: Secondary | ICD-10-CM

## 2021-05-14 DIAGNOSIS — D72829 Elevated white blood cell count, unspecified: Secondary | ICD-10-CM

## 2021-05-14 DIAGNOSIS — R6520 Severe sepsis without septic shock: Secondary | ICD-10-CM

## 2021-05-14 DIAGNOSIS — J1282 Pneumonia due to coronavirus disease 2019: Secondary | ICD-10-CM

## 2021-05-14 DIAGNOSIS — U071 COVID-19: Secondary | ICD-10-CM | POA: Diagnosis not present

## 2021-05-14 DIAGNOSIS — E785 Hyperlipidemia, unspecified: Secondary | ICD-10-CM

## 2021-05-14 DIAGNOSIS — F32A Depression, unspecified: Secondary | ICD-10-CM

## 2021-05-14 LAB — CBC
HCT: 41.3 % (ref 36.0–46.0)
Hemoglobin: 13.6 g/dL (ref 12.0–15.0)
MCH: 29.8 pg (ref 26.0–34.0)
MCHC: 32.9 g/dL (ref 30.0–36.0)
MCV: 90.6 fL (ref 80.0–100.0)
Platelets: 464 10*3/uL — ABNORMAL HIGH (ref 150–400)
RBC: 4.56 MIL/uL (ref 3.87–5.11)
RDW: 13.2 % (ref 11.5–15.5)
WBC: 22 10*3/uL — ABNORMAL HIGH (ref 4.0–10.5)
nRBC: 0 % (ref 0.0–0.2)

## 2021-05-14 LAB — BASIC METABOLIC PANEL
Anion gap: 8 (ref 5–15)
BUN: 18 mg/dL (ref 8–23)
CO2: 28 mmol/L (ref 22–32)
Calcium: 8.1 mg/dL — ABNORMAL LOW (ref 8.9–10.3)
Chloride: 101 mmol/L (ref 98–111)
Creatinine, Ser: 0.64 mg/dL (ref 0.44–1.00)
GFR, Estimated: >60 mL/min (ref >60)
Glucose, Bld: 97 mg/dL (ref 70–99)
Potassium: 3.9 mmol/L (ref 3.5–5.1)
Sodium: 137 mmol/L (ref 135–145)

## 2021-05-14 LAB — C-REACTIVE PROTEIN: CRP: 0.7 mg/dL (ref <1.0)

## 2021-05-14 MED ORDER — LORATADINE 10 MG PO TABS
10.0000 mg | ORAL_TABLET | Freq: Every day | ORAL | Status: DC
  Administered 2021-05-14 – 2021-05-15 (×2): 10 mg via ORAL
  Filled 2021-05-14 (×2): qty 1

## 2021-05-14 MED ORDER — CARBAMIDE PEROXIDE 6.5 % OT SOLN
5.0000 [drp] | Freq: Two times a day (BID) | OTIC | Status: DC
  Administered 2021-05-14 – 2021-05-15 (×3): 5 [drp] via OTIC
  Filled 2021-05-14: qty 15

## 2021-05-14 NOTE — Progress Notes (Signed)
Physical Therapy Treatment Patient Details Name: Kimberly Schmitt MRN: [id number] DOB: 1950/06/16 Today's Date: 05/14/2021   History of Present Illness Patient is a 71 year old female who presents to EMS from home with SOB. Patient recently diagnosed with COVID on 05/05/21. Per EMS pt's HR was in the 180s-190s with SVT. Patient given 6 mg of adenosine and 500 cc of saline. Patient initially hypoxic on room air with SP02 85%. PMH includes osteoporosis,GAD, insomnia, HLD, tobacco abuse.    PT Comments    Pt was independently walking in room upon arriving. She demonstrates safe steady gait kinematics and safely able to ascend/descend step without LOB or safety concern. No more PT needs identified. Will sign off. Highly recommended pt perform routine mobility. She does not want to have PT at DC.    Recommendations for follow up therapy are one component of a multi-disciplinary discharge planning process, led by the attending physician.  Recommendations may be updated based on patient status, additional functional criteria and insurance authorization.  Follow Up Recommendations  No PT follow up     Assistance Recommended at Discharge None  Patient can return home with the following Other (comment) (none required)   Equipment Recommendations  None recommended by PT       Precautions / Restrictions Precautions Precautions: Fall Restrictions Weight Bearing Restrictions: No     Mobility  Bed Mobility Overal bed mobility: Independent     Transfers Overall transfer level: Modified independent     Ambulation/Gait Ambulation/Gait assistance: Modified independent (Device/Increase time) Gait Distance (Feet): 200 Feet Assistive device: None Gait Pattern/deviations: WFL(Within Functional Limits) Gait velocity: decreased     General Gait Details: pt demonstrated safe steady gait kinematics   Stairs Stairs: Yes Stairs assistance: Supervision Stair Management: No rails Number of  Stairs: 1 General stair comments: pt was easily and safely able to ascend/descend  1 step without LOB or safety concern     Balance Overall balance assessment: Independent       Cognition Arousal/Alertness: Awake/alert Behavior During Therapy: WFL for tasks assessed/performed Overall Cognitive Status: Within Functional Limits for tasks assessed      General Comments: A and O x 4               Pertinent Vitals/Pain Pain Assessment: No/denies pain     PT Goals (current goals can now be found in the care plan section) Acute Rehab PT Goals Patient Stated Goal: to return home Progress towards PT goals: Progressing toward goals    Frequency    Min 2X/week      PT Plan Discharge plan needs to be updated       AM-PAC PT "6 Clicks" Mobility   Outcome Measure  Help needed turning from your back to your side while in a flat bed without using bedrails?: None Help needed moving from lying on your back to sitting on the side of a flat bed without using bedrails?: None Help needed moving to and from a bed to a chair (including a wheelchair)?: None Help needed standing up from a chair using your arms (e.g., wheelchair or bedside chair)?: None Help needed to walk in hospital room?: None Help needed climbing 3-5 steps with a railing? : None 6 Click Score: 24    End of Session Equipment Utilized During Treatment:  (none) Activity Tolerance: Patient tolerated treatment well Patient left: in chair;with call bell/phone within reach;with chair alarm set;with family/visitor present Nurse Communication: Mobility status PT Visit Diagnosis: Unsteadiness on feet (R26.81);Other  abnormalities of gait and mobility (R26.89);Muscle weakness (generalized) (M62.81);Difficulty in walking, not elsewhere classified (R26.2)     Time: 8003-4917 PT Time Calculation (min) (ACUTE ONLY): 12 min  Charges:  $Gait Training: 8-22 mins                     Julaine Fusi PTA 05/14/21, 4:00 PM

## 2021-05-14 NOTE — TOC Progression Note (Signed)
Transition of Care [facility]) - Progression Note    Patient Details  Name: Kimberly Schmitt MRN: [id number] Date of Birth: 01-07-1951  Transition of [facility]) CM/SW [place], RN Phone Number: 05/14/2021, 2:43 PM  Clinical Narrative:   Continued medical workup.  TOC to follow for needs    Expected Discharge Plan: [place] Barriers to Discharge: Continued Medical Work up  Expected Discharge Plan and Services Expected Discharge Plan: [place] arrangements for the past 2 months: [place]: PT [place]: [place] ([place]) Date [place]: 05/12/21   Representative spoke with at [place]: Floydene Flock   Social Determinants of Health (SDOH) Interventions    Readmission Risk Interventions No flowsheet data found.

## 2021-05-14 NOTE — Progress Notes (Signed)
Patient ID: Kimberly Schmitt, female   DOB: February 24, 1951, 71 y.o.   MRN: [id number] [facility] PROGRESS NOTE  Kimberly Schmitt KYH:[id number] DOB: 12/31/1950 DOA: 05/08/2021 PCP: Crecencio Mc, MD  HPI/Subjective: The patient is feeling better than when she came in.  Concerned that her white count is still high.  Complains that her ears are congested and full.  Has a hoarse voice.  Still with some cough.  Admitted initially with COVID-19 infection and also found to have haemophilus influenzae sepsis.  Objective: Vitals:   05/14/21 0747 05/14/21 1126  BP: 133/81 134/77  Pulse: 78 88  Resp: 18 18  Temp: 98.1 F (36.7 C) 98.2 F (36.8 C)  SpO2: 95% 94%   No intake or output data in the 24 hours ending 05/14/21 1401 Filed Weights   05/08/21 1326  Weight: 42.5 kg    ROS: Review of Systems  Constitutional:  Positive for malaise/fatigue.  Respiratory:  Positive for cough. Negative for shortness of breath.   Cardiovascular:  Negative for chest pain.  Gastrointestinal:  Negative for abdominal pain, nausea and vomiting.  Exam: Physical Exam HENT:     Head: Normocephalic.     Ears:     Comments: Tympanic membrane on the right obscured by wax.  Tympanic membrane on the left erythematous.    Mouth/Throat:     Pharynx: No oropharyngeal exudate.  Eyes:     General: Lids are normal.     Conjunctiva/sclera: Conjunctivae normal.  Cardiovascular:     Rate and Rhythm: Normal rate and regular rhythm.     Heart sounds: Normal heart sounds, S1 normal and S2 normal.  Pulmonary:     Breath sounds: Examination of the right-lower field reveals decreased breath sounds. Examination of the left-lower field reveals decreased breath sounds. Decreased breath sounds present. No wheezing, rhonchi or rales.  Abdominal:     Palpations: Abdomen is soft.     Tenderness: There is no abdominal tenderness.  Musculoskeletal:     Right lower leg: No swelling.     Left lower leg: No swelling.  Skin:     General: Skin is warm.     Findings: No rash.  Neurological:     Mental Status: She is alert and oriented to person, place, and time.      Scheduled Meds:  amoxicillin-clavulanate  1 tablet Oral BID   aspirin EC  81 mg Oral Daily   atorvastatin  20 mg Oral Daily   carbamide peroxide  5 drop Right EAR BID   enoxaparin (LOVENOX) injection  30 mg Subcutaneous Q24H   guaiFENesin  600 mg Oral BID   loratadine  10 mg Oral Daily   PARoxetine  10 mg Oral Daily     Assessment/Plan:  Sepsis, present on admission with multifocal pneumonia, tachycardia, tachypnea and leukocytosis..  Haemophilus influenza growing out of blood cultures.  Patient received Rocephin and Zithromax and switched over to Augmentin to complete treatment. Persistent leukocytosis likely secondary to sepsis, pneumonia and steroid use.  We will check white blood cell count again tomorrow and likely will need recheck as outpatient.  Discontinue steroids. COVID-19 pneumonia.  Completed remdesivir course.  Will discontinue steroids with elevated white count. Acute hypoxic respiratory failure with a pulse ox of 88% on room air on 05/08/2021.  Patient off oxygen at this point. Thrombocytosis likely secondary to sepsis and pneumonia. Depression on Paxil Hyperlipidemia unspecified on atorvastatin Impacted cerumen on the right Debrox otic solution Otitis media on the left.  Antibiotics should cover.  Start Claritin.      Code Status:     Code Status Orders  (From admission, onward)           Start     Ordered   05/09/21 1322  Do not attempt resuscitation (DNR)  Continuous       Question Answer Comment  In the event of cardiac or respiratory ARREST Do not call a code blue   In the event of cardiac or respiratory ARREST Do not perform Intubation, CPR, defibrillation or ACLS   In the event of cardiac or respiratory ARREST Use medication by any route, position, wound care, and other measures to relive pain and  suffering. May use oxygen, suction and manual treatment of airway obstruction as needed for comfort.      05/09/21 1321           Code Status History     Date Active Date Inactive Code Status Order ID Comments User Context   05/08/2021 2006 05/09/2021 1321 Full Code [id number]  Neena Rhymes, MD ED      Disposition Plan: Status is: Inpatient.  Potential disposition 05/15/2020 with white blood cell count trends a little bit better  Antibiotics: Augmentin  Dantrell [employer]  [facility]

## 2021-05-15 DIAGNOSIS — D72829 Elevated white blood cell count, unspecified: Secondary | ICD-10-CM | POA: Diagnosis not present

## 2021-05-15 DIAGNOSIS — U071 COVID-19: Secondary | ICD-10-CM | POA: Diagnosis not present

## 2021-05-15 DIAGNOSIS — J9601 Acute respiratory failure with hypoxia: Secondary | ICD-10-CM | POA: Diagnosis not present

## 2021-05-15 DIAGNOSIS — A413 Sepsis due to Hemophilus influenzae: Secondary | ICD-10-CM | POA: Diagnosis not present

## 2021-05-15 LAB — CBC
HCT: 40.3 % (ref 36.0–46.0)
Hemoglobin: 13.6 g/dL (ref 12.0–15.0)
MCH: 30.4 pg (ref 26.0–34.0)
MCHC: 33.7 g/dL (ref 30.0–36.0)
MCV: 90.2 fL (ref 80.0–100.0)
Platelets: 449 10*3/uL — ABNORMAL HIGH (ref 150–400)
RBC: 4.47 MIL/uL (ref 3.87–5.11)
RDW: 13.2 % (ref 11.5–15.5)
WBC: 18.8 10*3/uL — ABNORMAL HIGH (ref 4.0–10.5)
nRBC: 0 % (ref 0.0–0.2)

## 2021-05-15 LAB — CREATININE, SERUM
Creatinine, Ser: 0.6 mg/dL (ref 0.44–1.00)
GFR, Estimated: >60 mL/min (ref >60)

## 2021-05-15 MED ORDER — AMOXICILLIN-POT CLAVULANATE 875-125 MG PO TABS: 1.0000 | ORAL_TABLET | Freq: Two times a day (BID) | ORAL | 0 refills | Status: AC

## 2021-05-15 MED ORDER — NYSTATIN 100000 UNIT/ML MT SUSP
5.0000 mL | Freq: Four times a day (QID) | OROMUCOSAL | Status: DC
  Administered 2021-05-15: 09:00:00 500000 [IU] via ORAL
  Filled 2021-05-15: qty 5

## 2021-05-15 MED ORDER — CARBAMIDE PEROXIDE 6.5 % OT SOLN: 5.0000 [drp] | Freq: Two times a day (BID) | OTIC | 0 refills | Status: DC

## 2021-05-15 MED ORDER — NYSTATIN 100000 UNIT/ML MT SUSP: 5.0000 mL | Freq: Four times a day (QID) | OROMUCOSAL | 0 refills | Status: AC

## 2021-05-15 MED ORDER — ALBUTEROL SULFATE HFA 108 (90 BASE) MCG/ACT IN AERS: 2.0000 | INHALATION_SPRAY | Freq: Four times a day (QID) | RESPIRATORY_TRACT | 0 refills | Status: DC | PRN

## 2021-05-15 MED ORDER — LORATADINE 10 MG PO TABS: 10.0000 mg | ORAL_TABLET | Freq: Every day | ORAL | 0 refills | Status: AC

## 2021-05-15 NOTE — Discharge Summary (Signed)
[name]: Kimberly Schmitt    MR#:  [id number]  DATE OF BIRTH:  1951-02-09  DATE OF ADMISSION:  05/08/2021 ADMITTING PHYSICIAN: Neena Rhymes, MD  DATE OF DISCHARGE: 05/15/2021  9:37 AM  PRIMARY CARE PHYSICIAN: Crecencio Mc, MD    ADMISSION DIAGNOSIS:  Dehydration [E86.0] Hypokalemia [E87.6] SVT (supraventricular tachycardia) (HCC) [I47.1] CAP (community acquired pneumonia) [J18.9] SIRS (systemic inflammatory response syndrome) (HCC) [R65.10] Acute respiratory failure with hypoxia ([place]) [J96.01] Community acquired pneumonia of left lower lobe of lung [J18.9] Sepsis, due to unspecified organism, unspecified whether acute organ dysfunction present ([place]) [A41.9] COVID [U07.1]  DISCHARGE DIAGNOSIS:  Principal Problem:   CAP (community acquired pneumonia) Active Problems:   Tobacco abuse   Hyperlipidemia   GAD (generalized anxiety disorder)   Pneumonia due to COVID-19 virus   Sepsis ([place])   Leukocytosis   Acute respiratory failure with hypoxia ([place])   Thrombocytosis   Depression   SECONDARY DIAGNOSIS:   Past Medical History:  Diagnosis Date   Family history of breast cancer    Family history of colon cancer    Family history of pancreatic cancer    Hyperlipidemia    220 per patient   Personal history of tobacco use, presenting hazards to health 06/12/2015   Visit for preventive health examination 04/17/2013   .     HOSPITAL COURSE:   Sepsis, present on admission with multifocal pneumonia, tachycardia, tachypnea and leukocytosis.  Haemophilus influenzae growing out of blood cultures (in speaking with the infectious disease pharmacist sensitivities on haemophilus influenzae are not done).  The patient received Rocephin and Zithromax and then switched over to Augmentin to complete treatment of 5 more doses.  Patient clinically improved and stable for discharge. COVID-19 pneumonia.  The patient completed 5  days of remdesivir.  I discontinued steroids with elevated white count. Persistent leukocytosis likely secondary to sepsis, pneumonia and steroid use.  The patient's white count upon discharge 18.8.  Recommend checking a a repeat CBC in a few weeks to make sure white count comes down. Acute hypoxic respiratory failure with pulse ox of 88% on room air on 05/08/2021.  The patient is off oxygen at this point. Thrombocytosis likely acute phase reactant secondary to sepsis and pneumonia. Depression on Paxil Hyperlipidemia unspecified on atorvastatin Otitis media on the left.  Antibiotic should cover.  Claritin started here in the hospital Impacted cerumen on the right.  Debrox otic solution. Thrush on the tongue secondary to steroids.  Nystatin swish and swallow.  DISCHARGE CONDITIONS:   Satisfactory  CONSULTS OBTAINED:    None  DRUG ALLERGIES:  No Known Allergies  DISCHARGE MEDICATIONS:   Allergies as of 05/15/2021   No Known Allergies      Medication List     STOP taking these medications    doxycycline 100 MG tablet Commonly known as: VIBRA-TABS   Lagevrio 200 MG Caps capsule Generic drug: molnupiravir EUA       TAKE these medications    albuterol 108 (90 Base) MCG/ACT inhaler Commonly known as: VENTOLIN HFA Inhale 2 puffs into the lungs every 6 (six) hours as needed for wheezing or shortness of breath.   amoxicillin-clavulanate 875-125 MG tablet Commonly known as: AUGMENTIN Take 1 tablet by mouth 2 (two) times daily for 5 doses.   aspirin 81 MG tablet Take 81 mg by mouth daily.   atorvastatin 20 MG tablet Commonly known as: LIPITOR TAKE 1 TABLET BY MOUTH EVERY DAY  CALCIUM 1200 PO Take 1 capsule by mouth daily.   carbamide peroxide 6.5 % OTIC solution Commonly known as: DEBROX Place 5 drops into the right ear 2 (two) times daily.   cholecalciferol 1000 units tablet Commonly known as: VITAMIN D Take 1,000 Units by mouth daily.   LACTOBACILLUS  PROBIOTIC PO Take 1 capsule by mouth daily.   loratadine 10 MG tablet Commonly known as: CLARITIN Take 1 tablet (10 mg total) by mouth daily.   LORazepam 1 MG tablet Commonly known as: ATIVAN TAKE 1 TABLET (1 MG TOTAL) BY MOUTH 2 (TWO) TIMES DAILY. AS NEEDED FOR INSOMNIA AND ANXIETY   nystatin 100000 UNIT/ML suspension Commonly known as: MYCOSTATIN Take 5 mLs (500,000 Units total) by mouth 4 (four) times daily for 6 days.   PARoxetine 10 MG tablet Commonly known as: PAXIL TAKE 1 TABLET (10 MG TOTAL) BY MOUTH DAILY. AFTER DINNER         DISCHARGE INSTRUCTIONS:   Follow-up PMD 5 days  If you experience worsening of your admission symptoms, develop shortness of breath, life threatening emergency, suicidal or homicidal thoughts you must seek medical attention immediately by calling 911 or calling your MD immediately  if symptoms less severe.  You Must read complete instructions/literature along with all the possible adverse reactions/side effects for all the Medicines you take and that have been prescribed to you. Take any new Medicines after you have completely understood and accept all the possible adverse reactions/side effects.   Please note  You were cared for by a hospitalist during your hospital stay. If you have any questions about your discharge medications or the care you received while you were in the hospital after you are discharged, you can call the unit and asked to speak with the hospitalist on call if the hospitalist that took care of you is not available. Once you are discharged, your primary care physician will handle any further medical issues. Please note that NO REFILLS for any discharge medications will be authorized once you are discharged, as it is imperative that you return to your primary care physician (or establish a relationship with a primary care physician if you do not have one) for your aftercare needs so that they can reassess your need for medications  and monitor your lab values.    Today   CHIEF COMPLAINT:   Chief Complaint  Patient presents with   Chest Pain    HISTORY OF PRESENT ILLNESS:  Kimberly Schmitt  is a 71 y.o. female coming in with increasing shortness of breath muscle aches and cough.   VITAL SIGNS:  Blood pressure 129/73, pulse 88, temperature 97.8 F (36.6 C), temperature source Oral, resp. rate 16, height 4' 11.5" (1.511 m), weight 42.5 kg, SpO2 94 %.  I/O:  No intake or output data in the 24 hours ending 05/15/21 1538  PHYSICAL EXAMINATION:  GENERAL:  71 y.o.-year-old patient lying in the bed with no acute distress.  EYES: Pupils equal, round, reactive to light and accommodation. No scleral icterus.  HEENT: Head atraumatic, normocephalic. Oropharynx and nasopharynx clear.  Hoarse voice while speaking. LUNGS: Normal breath sounds bilaterally, no wheezing, rales,rhonchi or crepitation. No use of accessory muscles of respiration.  CARDIOVASCULAR: S1, S2 normal. No murmurs, rubs, or gallops.  ABDOMEN: Soft, non-tender, non-distended. Bowel sounds present. No organomegaly or mass.  EXTREMITIES: No pedal edema.  NEUROLOGIC: Cranial nerves II through XII are intact. Muscle strength 5/5 in all extremities. Sensation intact. Gait not checked.  PSYCHIATRIC: The patient  is alert and oriented x 3.  SKIN: No obvious rash, lesion, or ulcer.   DATA REVIEW:   CBC Recent Labs  Lab 05/15/21 0623  WBC 18.8*  HGB 13.6  HCT 40.3  PLT 449*    Chemistries  Recent Labs  Lab 05/10/21 0517 05/11/21 0658 05/13/21 0444 05/14/21 0651 05/15/21 0623  NA 142   < > 137 137  --   K 4.0   < > 4.0 3.9  --   CL 108   < > 100 101  --   CO2 29   < > 30 28  --   GLUCOSE 130*   < > 110* 97  --   BUN 28*   < > 18 18  --   CREATININE 0.54   < > 0.67 0.64 0.60  CALCIUM 7.7*   < > 8.0* 8.1*  --   MG 2.8*  --   --   --   --   AST 24   < > 32  --   --   ALT 25   < > 35  --   --   ALKPHOS 61   < > 62  --   --   BILITOT 0.6   < >  0.8  --   --    < > = values in this interval not displayed.     Microbiology Results  Results for orders placed or performed during the hospital encounter of 05/08/21  Blood Culture (routine x 2)     Status: Abnormal (Preliminary result)   Collection Time: 05/08/21  1:57 PM   Specimen: BLOOD  Result Value Ref Range Status   Specimen Description   Final    BLOOD RIGHT ANTECUBITAL Performed at [facility], [address] [place], [place] [address]    Special Requests   Final    BOTTLES DRAWN AEROBIC AND ANAEROBIC Blood Culture adequate volume Performed at [facility], [address], [place], [place] [address]    Culture  Setup Time   Final    GRAM NEGATIVE RODS IN BOTH AEROBIC AND ANAEROBIC BOTTLES Organism ID to follow CRITICAL RESULT CALLED TO, READ BACK BY AND VERIFIED WITH: NATHAN BELUE AT 0100 05/10/2021 DLB Performed at [facility], [address] [place], [place] [address]    Culture (A)  Final    HAEMOPHILUS INFLUENZAE BETA LACTAMASE [place] NOTIFIED Referred to [facility] in [place], [place] for serotyping. Performed at [facility], [place] [address], [place],  [address]    Report Status PENDING  Incomplete  Blood Culture ID Panel (Reflexed)     Status: Abnormal   Collection Time: 05/08/21  1:57 PM  Result Value Ref Range Status   Enterococcus faecalis NOT DETECTED NOT DETECTED Final   Enterococcus Faecium NOT DETECTED NOT DETECTED Final   Listeria monocytogenes NOT DETECTED NOT DETECTED Final   Staphylococcus species NOT DETECTED NOT DETECTED Final   Staphylococcus aureus (BCID) NOT DETECTED NOT DETECTED Final   Staphylococcus epidermidis NOT DETECTED NOT DETECTED Final   Staphylococcus lugdunensis NOT DETECTED NOT DETECTED Final   Streptococcus species NOT DETECTED NOT DETECTED Final   Streptococcus agalactiae NOT DETECTED NOT DETECTED Final   Streptococcus pneumoniae NOT DETECTED NOT  DETECTED Final   Streptococcus pyogenes NOT DETECTED NOT DETECTED Final   A.calcoaceticus-baumannii NOT DETECTED NOT DETECTED Final   Bacteroides fragilis NOT DETECTED NOT DETECTED Final   Enterobacterales NOT DETECTED NOT DETECTED Final  Enterobacter cloacae complex NOT DETECTED NOT DETECTED Final   Escherichia coli NOT DETECTED NOT DETECTED Final   Klebsiella aerogenes NOT DETECTED NOT DETECTED Final   Klebsiella oxytoca NOT DETECTED NOT DETECTED Final   Klebsiella pneumoniae NOT DETECTED NOT DETECTED Final   Proteus species NOT DETECTED NOT DETECTED Final   Salmonella species NOT DETECTED NOT DETECTED Final   Serratia marcescens NOT DETECTED NOT DETECTED Final   Haemophilus influenzae DETECTED (A) NOT DETECTED Final    Comment: CRITICAL RESULT CALLED TO, READ BACK BY AND VERIFIED WITH: NATHAN BELUE AT 0100 05/10/2021 DLB    Neisseria meningitidis NOT DETECTED NOT DETECTED Final   Pseudomonas aeruginosa NOT DETECTED NOT DETECTED Final   Stenotrophomonas maltophilia NOT DETECTED NOT DETECTED Final   Candida albicans NOT DETECTED NOT DETECTED Final   Candida auris NOT DETECTED NOT DETECTED Final   Candida glabrata NOT DETECTED NOT DETECTED Final   Candida krusei NOT DETECTED NOT DETECTED Final   Candida parapsilosis NOT DETECTED NOT DETECTED Final   Candida tropicalis NOT DETECTED NOT DETECTED Final   Cryptococcus neoformans/gattii NOT DETECTED NOT DETECTED Final    Comment: Performed at [facility], [address] [place], [place] [address]  Blood Culture (routine x 2)     Status: None   Collection Time: 05/08/21  8:10 PM   Specimen: BLOOD  Result Value Ref Range Status   Specimen Description BLOOD BLOOD LEFT HAND  Final   Special Requests   Final    BOTTLES DRAWN AEROBIC AND ANAEROBIC Blood Culture adequate volume   Culture   Final    NO GROWTH 5 DAYS Performed at [facility], [address] [place], [place] [address]    Report Status 05/13/2021 FINAL   Final  Respiratory (~20 pathogens) panel by PCR     Status: None   Collection Time: 05/09/21  1:32 PM   Specimen: Nasopharyngeal Swab; Respiratory  Result Value Ref Range Status   Adenovirus NOT DETECTED NOT DETECTED Final   Coronavirus 229E NOT DETECTED NOT DETECTED Final    Comment: (NOTE) The Coronavirus on the Respiratory Panel, DOES NOT test for the novel  Coronavirus (2019 nCoV)    Coronavirus HKU1 NOT DETECTED NOT DETECTED Final   Coronavirus NL63 NOT DETECTED NOT DETECTED Final   Coronavirus OC43 NOT DETECTED NOT DETECTED Final   Metapneumovirus NOT DETECTED NOT DETECTED Final   Rhinovirus / Enterovirus NOT DETECTED NOT DETECTED Final   Influenza A NOT DETECTED NOT DETECTED Final   Influenza B NOT DETECTED NOT DETECTED Final   Parainfluenza Virus 1 NOT DETECTED NOT DETECTED Final   Parainfluenza Virus 2 NOT DETECTED NOT DETECTED Final   Parainfluenza Virus 3 NOT DETECTED NOT DETECTED Final   Parainfluenza Virus 4 NOT DETECTED NOT DETECTED Final   Respiratory Syncytial Virus NOT DETECTED NOT DETECTED Final   Bordetella pertussis NOT DETECTED NOT DETECTED Final   Bordetella Parapertussis NOT DETECTED NOT DETECTED Final   Chlamydophila pneumoniae NOT DETECTED NOT DETECTED Final   Mycoplasma pneumoniae NOT DETECTED NOT DETECTED Final    Comment: Performed at [facility], [place]. [address] [place], [place] [address]  CULTURE, BLOOD (ROUTINE X 2) w Reflex to ID Panel     Status: None (Preliminary result)   Collection Time: 05/12/21  2:50 PM   Specimen: BLOOD  Result Value Ref Range Status   Specimen Description BLOOD RIGHT [facility]  Final   Special Requests   Final    BOTTLES DRAWN AEROBIC AND ANAEROBIC  Blood Culture adequate volume   Culture   Final    NO GROWTH 3 DAYS Performed at [facility], [address] [place], [place] [address]    Report Status PENDING  Incomplete  CULTURE, BLOOD (ROUTINE X 2) w Reflex to ID Panel     Status: None (Preliminary result)    Collection Time: 05/12/21  2:50 PM   Specimen: BLOOD  Result Value Ref Range Status   Specimen Description BLOOD RIGHT HAND  Final   Special Requests   Final    BOTTLES DRAWN AEROBIC AND ANAEROBIC Blood Culture adequate volume   Culture   Final    NO GROWTH 3 DAYS Performed at [facility], [address] [place], [place] [address]    Report Status PENDING  Incomplete       Management plans discussed with the patient, family and they are in agreement.  CODE STATUS:  Code Status History     Date Active Date Inactive Code Status Order ID Comments User Context   05/09/2021 1321 05/15/2021 1437 DNR [id number]  Florencia Reasons, MD Inpatient   05/08/2021 2006 05/09/2021 1321 Full Code [id number]  Neena Rhymes, MD ED    Questions for Most Recent Historical Code Status (Order [id number])     Question Answer   In the event of cardiac or respiratory ARREST Do not call a code blue   In the event of cardiac or respiratory ARREST Do not perform Intubation, CPR, defibrillation or ACLS   In the event of cardiac or respiratory ARREST Use medication by any route, position, wound care, and other measures to relive pain and suffering. May use oxygen, suction and manual treatment of airway obstruction as needed for comfort.            TOTAL TIME TAKING CARE OF THIS PATIENT: 35 minutes.    Loletha Grayer M.D on 05/15/2021 at 3:38 PM    [facility]  CC: Primary care physician; Crecencio Mc, MD

## 2021-05-15 NOTE — TOC Progression Note (Signed)
Transition of Care [facility]) - Progression Note    Patient Details  Name: Kimberly Schmitt MRN: [id number] Date of Birth: 1950-06-15  Transition of [facility]) CM/SW [place], RN Phone Number: 05/15/2021, 8:53 AM  Clinical Narrative:   Patient has [place] notified of discharge today.  No DME recommended by PT.    Expected Discharge Plan: [place] Barriers to Discharge: Continued Medical Work up  Expected Discharge Plan and Services Expected Discharge Plan: [place] arrangements for the past 2 months: Single Family Home Expected Discharge Date: 05/15/21                         HH Arranged: PT [place]: [place] ([place]) Date [place]: 05/12/21   Representative spoke with at [place]: Floydene Flock   Social Determinants of Health (SDOH) Interventions    Readmission Risk Interventions No flowsheet data found.

## 2021-05-15 NOTE — Progress Notes (Signed)
Discharge instructions given and went over with patient and granddaughter at bedside. Patient discharged home. Orma Flaming, RN

## 2021-05-16 ENCOUNTER — Telehealth: Payer: Self-pay

## 2021-05-16 DIAGNOSIS — A413 Sepsis due to Hemophilus influenzae: Secondary | ICD-10-CM | POA: Diagnosis not present

## 2021-05-16 DIAGNOSIS — H6690 Otitis media, unspecified, unspecified ear: Secondary | ICD-10-CM | POA: Diagnosis not present

## 2021-05-16 DIAGNOSIS — F17210 Nicotine dependence, cigarettes, uncomplicated: Secondary | ICD-10-CM | POA: Diagnosis not present

## 2021-05-16 DIAGNOSIS — F32A Depression, unspecified: Secondary | ICD-10-CM | POA: Diagnosis not present

## 2021-05-16 DIAGNOSIS — J9601 Acute respiratory failure with hypoxia: Secondary | ICD-10-CM | POA: Diagnosis not present

## 2021-05-16 DIAGNOSIS — D75839 Thrombocytosis, unspecified: Secondary | ICD-10-CM | POA: Diagnosis not present

## 2021-05-16 DIAGNOSIS — J1282 Pneumonia due to coronavirus disease 2019: Secondary | ICD-10-CM | POA: Diagnosis not present

## 2021-05-16 DIAGNOSIS — F411 Generalized anxiety disorder: Secondary | ICD-10-CM | POA: Diagnosis not present

## 2021-05-16 DIAGNOSIS — U071 COVID-19: Secondary | ICD-10-CM | POA: Diagnosis not present

## 2021-05-16 DIAGNOSIS — E785 Hyperlipidemia, unspecified: Secondary | ICD-10-CM | POA: Diagnosis not present

## 2021-05-16 DIAGNOSIS — J14 Pneumonia due to Hemophilus influenzae: Secondary | ICD-10-CM | POA: Diagnosis not present

## 2021-05-16 DIAGNOSIS — Z7982 Long term (current) use of aspirin: Secondary | ICD-10-CM | POA: Diagnosis not present

## 2021-05-16 NOTE — Telephone Encounter (Signed)
Transition Care Management Unsuccessful Follow-up Telephone Call  Date of discharge and from where:  05/15/21 [facility]  Attempts:  1st Attempt  Reason for unsuccessful TCM follow-up call:  Unable to reach patient. HFU scheduled 05/23/21 @ 3:00.  Keep all scheduled appointments. Will follow.

## 2021-05-17 LAB — CULTURE, BLOOD (ROUTINE X 2)
Culture: NO GROWTH
Culture: NO GROWTH
Special Requests: ADEQUATE
Special Requests: ADEQUATE

## 2021-05-19 NOTE — Telephone Encounter (Signed)
Transition Care Management Unsuccessful Follow-up Telephone Call  Date of discharge and from where:  05/15/21 [facility]  Attempts:  2nd Attempt  Reason for unsuccessful TCM follow-up call:   Unable to reach patient. HFU scheduled 05/23/21 @ 3:00.  Keep all scheduled appointments. Will follow.

## 2021-05-20 DIAGNOSIS — J1282 Pneumonia due to coronavirus disease 2019: Secondary | ICD-10-CM | POA: Diagnosis not present

## 2021-05-20 DIAGNOSIS — H6690 Otitis media, unspecified, unspecified ear: Secondary | ICD-10-CM | POA: Diagnosis not present

## 2021-05-20 DIAGNOSIS — U071 COVID-19: Secondary | ICD-10-CM | POA: Diagnosis not present

## 2021-05-20 DIAGNOSIS — J9601 Acute respiratory failure with hypoxia: Secondary | ICD-10-CM | POA: Diagnosis not present

## 2021-05-20 DIAGNOSIS — J14 Pneumonia due to Hemophilus influenzae: Secondary | ICD-10-CM | POA: Diagnosis not present

## 2021-05-20 DIAGNOSIS — A413 Sepsis due to Hemophilus influenzae: Secondary | ICD-10-CM | POA: Diagnosis not present

## 2021-05-20 NOTE — Telephone Encounter (Signed)
Transition Care Management Follow-up Telephone Call Date of discharge and from where: 05/15/21 [facility] How have you been since you were released from the hospital? Intermittent sore throat. Nystatin swish and swallow in use.Whispers when speaking.  Drops in use R ear, denies pain in ear. Hearing not improved. HHPT is in progress, started today. Denies chest pain, fever in the last 48 hours, cough, N/V/D. Appetite good. Staying hydrated. No new or worsening symptoms.  Any questions or concerns? No  Items Reviewed: Did the pt receive and understand the discharge instructions provided? Yes  Medications obtained and verified? Yes . Antibiotic completed.  Any new allergies since your discharge? No  Dietary orders reviewed? Yes Do you have support at home? Yes , grandson close by.   Home Care and Equipment/Supplies: Were home health services ordered? Yes, in progress with PT.   Functional Questionnaire: (I = Independent and D = Dependent) ADLs: I  Follow up appointments reviewed:  PCP Hospital f/u appt confirmed? Yes  Scheduled to see PCP 05/23/21 @ 3:30.  Reports she can come sooner if there is a cancellation.  Are transportation arrangements needed? No  If their condition worsens, is the pt aware to call PCP or go to the Emergency Dept.? Yes Was the patient provided with contact information for the PCP's office or ED? Yes Was to pt encouraged to call back with questions or concerns? Yes

## 2021-05-23 ENCOUNTER — Ambulatory Visit (INDEPENDENT_AMBULATORY_CARE_PROVIDER_SITE_OTHER): Payer: Medicare Other | Admitting: Internal Medicine

## 2021-05-23 ENCOUNTER — Other Ambulatory Visit: Payer: Self-pay

## 2021-05-23 ENCOUNTER — Encounter: Payer: Self-pay | Admitting: Internal Medicine

## 2021-05-23 ENCOUNTER — Telehealth: Payer: Self-pay | Admitting: Internal Medicine

## 2021-05-23 VITALS — BP 122/74 | HR 90 | Temp 97.6°F | Ht 59.5 in | Wt 91.4 lb

## 2021-05-23 DIAGNOSIS — Z716 Tobacco abuse counseling: Secondary | ICD-10-CM | POA: Diagnosis not present

## 2021-05-23 DIAGNOSIS — E860 Dehydration: Secondary | ICD-10-CM | POA: Diagnosis not present

## 2021-05-23 DIAGNOSIS — D72825 Bandemia: Secondary | ICD-10-CM | POA: Diagnosis not present

## 2021-05-23 DIAGNOSIS — J14 Pneumonia due to Hemophilus influenzae: Secondary | ICD-10-CM | POA: Diagnosis not present

## 2021-05-23 DIAGNOSIS — A413 Sepsis due to Hemophilus influenzae: Secondary | ICD-10-CM | POA: Diagnosis not present

## 2021-05-23 DIAGNOSIS — D75839 Thrombocytosis, unspecified: Secondary | ICD-10-CM | POA: Diagnosis not present

## 2021-05-23 DIAGNOSIS — H6690 Otitis media, unspecified, unspecified ear: Secondary | ICD-10-CM | POA: Diagnosis not present

## 2021-05-23 DIAGNOSIS — J9601 Acute respiratory failure with hypoxia: Secondary | ICD-10-CM | POA: Diagnosis not present

## 2021-05-23 DIAGNOSIS — U071 COVID-19: Secondary | ICD-10-CM

## 2021-05-23 DIAGNOSIS — J189 Pneumonia, unspecified organism: Secondary | ICD-10-CM

## 2021-05-23 DIAGNOSIS — J1282 Pneumonia due to coronavirus disease 2019: Secondary | ICD-10-CM

## 2021-05-23 DIAGNOSIS — Z09 Encounter for follow-up examination after completed treatment for conditions other than malignant neoplasm: Secondary | ICD-10-CM

## 2021-05-23 NOTE — Progress Notes (Signed)
Subjective:  Patient ID: Kimberly Schmitt, female    DOB: 1950/10/12  Age: 71 y.o. MRN: [id number]  CC: The primary encounter diagnosis was Bandemia. Diagnoses of Thrombocytosis, Dehydration, Multifocal pneumonia, Hospital discharge follow-up, Tobacco abuse counseling, and Pneumonia due to COVID-19 virus were also pertinent to this visit.   This visit occurred during the SARS-CoV-2 public health emergency.  Safety protocols were in place, including screening questions prior to the visit, additional usage of staff PPE, and extensive cleaning of exam room while observing appropriate contact time as indicated for disinfecting solutions.    HPI Kimberly Schmitt presents for hospital follow up Chief Complaint  Patient presents with   Hospitalization [facility] Follow up from Covid, pneumonia and sepsis   Kimberly Schmitt is  A 71 yr old fwidowed female with history of tobacco abuse and chronic underweight status who was treated by Urgent Care on Dec 26 for COVID with molnupiravir.  Symptoms progressed and she  was .admitted to hospital on Dec 29  with hypoxic resp failure, sepsis, and  dehydration from  COVID related diarrhea and multifocal pneumonia .  Marland Kitchen  She remained in house from Dec 29 to January  5  for treatment with broad spectrum antibiotics, steroids , and supplemental oxygen  and was discharged on augmentin WITHOUT PREDNISONE (last dose in house).  She has been taking a probiotic,  diarrhea  started to improve on Jan  10  and stools have been becoming more formed.    Tobacco abuse:  Has not really smoked in 10 days,    took 3 attempts today to finish one cigarette, which she attempted because she is finally starting to feel like herself and craving nicotine. Appetite slowly coming back:  Eating jello, protein shakes  [place]vita and fruit. No taste for meat. Drinking water,  juice   Has been walking in the house and outside,  no drops in sats   96%   Wants to return to work next week part  time. Works in an office setting.     Outpatient Medications Prior to Visit  Medication Sig Dispense Refill   albuterol (VENTOLIN HFA) 108 (90 Base) MCG/ACT inhaler Inhale 2 puffs into the lungs every 6 (six) hours as needed for wheezing or shortness of breath. 8 g 0   aspirin 81 MG tablet Take 81 mg by mouth daily.     atorvastatin (LIPITOR) 20 MG tablet TAKE 1 TABLET BY MOUTH EVERY DAY 90 tablet 3   Calcium Carbonate-Vit D-Min (CALCIUM 1200 PO) Take 1 capsule by mouth daily.     carbamide peroxide (DEBROX) 6.5 % OTIC solution Place 5 drops into the right ear 2 (two) times daily. 15 mL 0   cholecalciferol (VITAMIN D) 1000 units tablet Take 1,000 Units by mouth daily.     LACTOBACILLUS PROBIOTIC PO Take 1 capsule by mouth daily.     loratadine (CLARITIN) 10 MG tablet Take 1 tablet (10 mg total) by mouth daily. 30 tablet 0   LORazepam (ATIVAN) 1 MG tablet TAKE 1 TABLET (1 MG TOTAL) BY MOUTH 2 (TWO) TIMES DAILY. AS NEEDED FOR INSOMNIA AND ANXIETY 30 tablet 2   PARoxetine (PAXIL) 10 MG tablet TAKE 1 TABLET (10 MG TOTAL) BY MOUTH DAILY. AFTER DINNER 90 tablet 2   No facility-administered medications prior to visit.    Review of Systems;  Patient denies headache, fevers, malaise, unintentional weight loss, skin rash, eye pain, sinus congestion and sinus pain, sore throat, dysphagia,  hemoptysis , cough, dyspnea, wheezing, chest pain, palpitations, orthopnea, edema, abdominal pain, nausea, melena, diarrhea, constipation, flank pain, dysuria, hematuria, urinary  Frequency, nocturia, numbness, tingling, seizures,  Focal weakness, Loss of consciousness,  Tremor, insomnia, depression, anxiety, and suicidal ideation.      Objective:  BP 122/74 (BP Location: Left Arm, Patient Position: Sitting, Cuff Size: Normal)    Pulse 90    Temp 97.6 F (36.4 C) (Oral)    Ht 4' 11.5" (1.511 m)    Wt 91 lb 6.4 oz (41.5 kg)    SpO2 94%    BMI 18.15 kg/m   BP Readings from Last 3 Encounters:  05/23/21 122/74   05/15/21 129/73  02/21/21 (!) 163/77    Wt Readings from Last 3 Encounters:  05/23/21 91 lb 6.4 oz (41.5 kg)  05/08/21 93 lb 11.1 oz (42.5 kg)  02/21/21 93 lb 11.1 oz (42.5 kg)    General appearance: alert, cooperative and appears stated age Ears: normal TM's and external ear canals both ears Throat: lips, mucosa, and tongue normal; teeth and gums normal Neck: no adenopathy, no carotid bruit, supple, symmetrical, trachea midline and thyroid not enlarged, symmetric, no tenderness/mass/nodules Back: symmetric, no curvature. ROM normal. No CVA tenderness. Lungs: clear to auscultation bilaterally Heart: regular rate and rhythm, S1, S2 normal, no murmur, click, rub or gallop Abdomen: soft, non-tender; bowel sounds normal; no masses,  no organomegaly Pulses: 2+ and symmetric Skin: Skin color, texture, turgor normal. No rashes or lesions Lymph nodes: Cervical, supraclavicular, and axillary nodes normal.  No results found for: HGBA1C  Lab Results  Component Value Date   CREATININE 0.60 05/23/2021   CREATININE 0.60 05/15/2021   CREATININE 0.64 05/14/2021    Lab Results  Component Value Date   WBC 10.4 05/23/2021   HGB 13.1 05/23/2021   HCT 39.0 05/23/2021   PLT 334 05/23/2021   GLUCOSE 82 05/23/2021   CHOL 155 01/16/2021   TRIG 78.0 01/16/2021   HDL 73.10 01/16/2021   LDLDIRECT 147.5 04/17/2013   LDLCALC 66 01/16/2021   ALT 21 05/23/2021   AST 20 05/23/2021   NA 141 05/23/2021   K 4.5 05/23/2021   CL 103 05/23/2021   CREATININE 0.60 05/23/2021   BUN 19 05/23/2021   CO2 30 05/23/2021   TSH 0.537 05/08/2021   INR 1.1 05/09/2021    CT Angio Chest PE W and/or Wo Contrast  Result Date: 05/08/2021 CLINICAL DATA:  Pulmonary embolus suspected with high probability. Recent diagnosis of COVID. Shortness of breath. EXAM: CT ANGIOGRAPHY CHEST WITH CONTRAST TECHNIQUE: Multidetector CT imaging of the chest was performed using the standard protocol during bolus administration of  intravenous contrast. Multiplanar CT image reconstructions and MIPs were obtained to evaluate the vascular anatomy. CONTRAST:  55mL OMNIPAQUE IOHEXOL 350 MG/ML SOLN COMPARISON:  None 07/29/2019 FINDINGS: Cardiovascular: Good opacification of the central and segmental pulmonary arteries. No focal filling defects. No evidence of significant pulmonary embolus. Normal heart size. No pericardial effusions. Normal caliber thoracic aorta. No aortic dissection. Great vessel origins are patent. Coronary artery and aortic calcifications. Mediastinum/Nodes: Esophagus is decompressed. No significant lymphadenopathy. Thyroid gland is unremarkable. Lungs/Pleura: Emphysematous changes in the lungs. Bronchial wall thickening consistent with chronic bronchitis. Patchy nodular airspace disease most prominent in the lung bases bilaterally. This is likely multifocal pneumonia. No pleural effusions. No pneumothorax. Upper Abdomen: No acute abnormality. Musculoskeletal: Degenerative changes in the spine. No destructive bone lesions. Review of the MIP images confirms the above findings. IMPRESSION: 1. No evidence of  significant pulmonary embolus. 2. Patchy nodular infiltrates in the lung bases, likely multifocal pneumonia and consistent with COVID pneumonia. 3. Chronic emphysematous and bronchitic changes in the lungs. Electronically Signed   By: Lucienne Capers M.D.   On: 05/08/2021 17:14   DG Chest Port 1 View  Result Date: 05/08/2021 CLINICAL DATA:  Shortness of breath.  COVID positive. EXAM: PORTABLE CHEST 1 VIEW COMPARISON:  CT chest dated February 01, 2020. FINDINGS: The heart size and mediastinal contours are within normal limits. The lungs are hyperinflated with emphysematous changes. Minimal patchy opacity at the left lung base. No focal consolidation, pleural effusion, or pneumothorax. No acute osseous abnormality. IMPRESSION: 1. Minimal left basilar atelectasis versus infiltrate. Electronically Signed   By: Titus Dubin M.D.   On: 05/08/2021 14:11    Assessment & Plan:   Problem List Items Addressed This Visit     Tobacco abuse counseling    Strongly encouraged her to take the opportunity to quit smoking she has not smoked in ten days       Pneumonia due to COVID-19 virus    She has clinically improved and is no longer requring supplemental oxygen. Repeat CXR in 6 weeks to ensure resolution of infiltrates      Leukocytosis - Primary   Relevant Orders   CBC with Differential/Platelet (Completed)   Thrombocytosis   Relevant Orders   CBC with Differential/Platelet (Completed)   Hospital discharge follow-up    Patient is stable post discharge and has no new issues or questions about discharge plans at the visit today for hospital follow up.  I have reviewed the records from the hospital admission in detail with patient today.      Other Visit Diagnoses     Dehydration       Relevant Orders   Comprehensive metabolic panel (Completed)   Multifocal pneumonia       Relevant Orders   DG Chest 2 View       I am having Riley A. Bartolomei maintain her aspirin, cholecalciferol, Calcium Carbonate-Vit D-Min (CALCIUM 1200 PO), PARoxetine, LACTOBACILLUS PROBIOTIC PO, LORazepam, atorvastatin, albuterol, carbamide peroxide, and loratadine.  No orders of the defined types were placed in this encounter.    I provided  30 minutes of  face-to-face time during this encounter reviewing patient's current problems and past surgeries, labs and imaging studies, providing counseling on the above mentioned problems , and coordination  of care .   Follow-up: No follow-ups on file.   Crecencio Mc, MD

## 2021-05-23 NOTE — Telephone Encounter (Signed)
Pt was checkout and no notes for follow up appt. Schedule Pt Chest X-Ray for Feb 13 at 3:15pm. Pt is requesting follow up appt with Dr. Derrel Nip. Pt requesting callback

## 2021-05-23 NOTE — Patient Instructions (Signed)
Every time you feel like smoking ..  I want you to EAT!   PLEASE INCREASE YOUR PROTEIN INTAKE TO 60 GRAMS DAILY   REPEAT CHEST X RAY ON FEB 13

## 2021-05-24 DIAGNOSIS — Z09 Encounter for follow-up examination after completed treatment for conditions other than malignant neoplasm: Secondary | ICD-10-CM | POA: Insufficient documentation

## 2021-05-24 LAB — COMPREHENSIVE METABOLIC PANEL
AG Ratio: 1.5 (calc) (ref 1.0–2.5)
ALT: 21 U/L (ref 6–29)
AST: 20 U/L (ref 10–35)
Albumin: 3.8 g/dL (ref 3.6–5.1)
Alkaline phosphatase (APISO): 44 U/L (ref 37–153)
BUN: 19 mg/dL (ref 7–25)
CO2: 30 mmol/L (ref 20–32)
Calcium: 9.5 mg/dL (ref 8.6–10.4)
Chloride: 103 mmol/L (ref 98–110)
Creat: 0.6 mg/dL (ref 0.60–1.00)
Globulin: 2.6 g/dL (calc) (ref 1.9–3.7)
Glucose, Bld: 82 mg/dL (ref 65–99)
Potassium: 4.5 mmol/L (ref 3.5–5.3)
Sodium: 141 mmol/L (ref 135–146)
Total Bilirubin: 0.7 mg/dL (ref 0.2–1.2)
Total Protein: 6.4 g/dL (ref 6.1–8.1)

## 2021-05-24 LAB — CBC WITH DIFFERENTIAL/PLATELET
Absolute Monocytes: 863 cells/uL (ref 200–950)
Basophils Absolute: 94 cells/uL (ref 0–200)
Basophils Relative: 0.9 %
Eosinophils Absolute: 52 cells/uL (ref 15–500)
Eosinophils Relative: 0.5 %
HCT: 39 % (ref 35.0–45.0)
Hemoglobin: 13.1 g/dL (ref 11.7–15.5)
Lymphs Abs: 2777 cells/uL (ref 850–3900)
MCH: 30.8 pg (ref 27.0–33.0)
MCHC: 33.6 g/dL (ref 32.0–36.0)
MCV: 91.5 fL (ref 80.0–100.0)
MPV: 10.2 fL (ref 7.5–12.5)
Monocytes Relative: 8.3 %
Neutro Abs: 6614 cells/uL (ref 1500–7800)
Neutrophils Relative %: 63.6 %
Platelets: 334 10*3/uL (ref 140–400)
RBC: 4.26 10*6/uL (ref 3.80–5.10)
RDW: 12.8 % (ref 11.0–15.0)
Total Lymphocyte: 26.7 %
WBC: 10.4 10*3/uL (ref 3.8–10.8)

## 2021-05-24 NOTE — Assessment & Plan Note (Signed)
She has clinically improved and is no longer requring supplemental oxygen. Repeat CXR in 6 weeks to ensure resolution of infiltrates

## 2021-05-24 NOTE — Assessment & Plan Note (Signed)
Strongly encouraged her to take the opportunity to quit smoking she has not smoked in ten days

## 2021-05-24 NOTE — Assessment & Plan Note (Signed)
Patient is stable post discharge and has no new issues or questions about discharge plans at the visit today for hospital follow up.  I have reviewed the records from the hospital admission in detail with patient today. 

## 2021-05-26 DIAGNOSIS — J9601 Acute respiratory failure with hypoxia: Secondary | ICD-10-CM | POA: Diagnosis not present

## 2021-05-26 DIAGNOSIS — A413 Sepsis due to Hemophilus influenzae: Secondary | ICD-10-CM | POA: Diagnosis not present

## 2021-05-26 DIAGNOSIS — J14 Pneumonia due to Hemophilus influenzae: Secondary | ICD-10-CM | POA: Diagnosis not present

## 2021-05-26 DIAGNOSIS — J1282 Pneumonia due to coronavirus disease 2019: Secondary | ICD-10-CM | POA: Diagnosis not present

## 2021-05-26 DIAGNOSIS — H6690 Otitis media, unspecified, unspecified ear: Secondary | ICD-10-CM | POA: Diagnosis not present

## 2021-05-26 DIAGNOSIS — U071 COVID-19: Secondary | ICD-10-CM | POA: Diagnosis not present

## 2021-05-26 NOTE — Telephone Encounter (Signed)
Spoke with Kimberly Schmitt to let her know that I did not see where a follow up was needed unless she is not feeling better or gets worse or if the chest xray shows something that Dr. Derrel Nip would want to see her back for. Kimberly Schmitt gave a verbal understanding. Kimberly Schmitt also stated that he ears fell full and scheduled an appt with Dr. Olivia Mackie to have them looked at.

## 2021-05-27 LAB — CULTURE, BLOOD (ROUTINE X 2): Special Requests: ADEQUATE

## 2021-05-29 ENCOUNTER — Ambulatory Visit (INDEPENDENT_AMBULATORY_CARE_PROVIDER_SITE_OTHER): Payer: Medicare Other | Admitting: Internal Medicine

## 2021-05-29 ENCOUNTER — Other Ambulatory Visit: Payer: Self-pay

## 2021-05-29 ENCOUNTER — Encounter: Payer: Self-pay | Admitting: Internal Medicine

## 2021-05-29 VITALS — BP 112/76 | HR 67 | Temp 97.0°F | Ht 59.5 in | Wt 91.0 lb

## 2021-05-29 DIAGNOSIS — H8303 Labyrinthitis, bilateral: Secondary | ICD-10-CM | POA: Diagnosis not present

## 2021-05-29 DIAGNOSIS — R490 Dysphonia: Secondary | ICD-10-CM

## 2021-05-29 MED ORDER — SALINE SPRAY 0.65 % NA SOLN: 2.0000 | Freq: Every day | NASAL | 11 refills | Status: AC | PRN

## 2021-05-29 MED ORDER — FLUTICASONE PROPIONATE 50 MCG/ACT NA SUSP: 2.0000 | Freq: Every day | NASAL | 11 refills | Status: DC

## 2021-05-29 MED ORDER — PREDNISONE 20 MG PO TABS: 40.0000 mg | ORAL_TABLET | Freq: Every day | ORAL | 0 refills | Status: DC

## 2021-05-29 NOTE — Patient Instructions (Addendum)
Call back if ENT referral is desired  Labrinthyitis after being sick can cause hearing loss  Sudafed try for 3 days   Hoarseness Hoarseness, also called dysphonia, is any abnormal change in your voice that can make it difficult to speak. Your voice may sound raspy, breathy, or strained. Hoarseness is caused by a problem with your vocal cords (vocal folds). These are two bands of tissue inside your voice box (larynx). When you speak, your vocal cords move back and forth to create sound. The surfaces of your vocal cords need to be smooth for your voice to sound clear. Swelling or lumps on your vocal cords can cause hoarseness. Vocal cord problems may be the result of injuries or abnormal growths, certain diseases, upper respiratory infection, or allergies. Other causes may include medicine side effects and exposure to irritants. Follow these instructions at home: Pay attention to any changes in your symptoms. Take these actions to stay safe and to help relieve your symptoms: Lifestyle Do not eat foods that give you heartburn, such as spicy or acidic foods like hot peppers and orange juice. These foods can cause a gastroesophageal reflux that may worsen your vocal cord problems. Limit how much alcohol and caffeine you drink as told by your health care provider. Drink enough fluid to keep your urine pale yellow. Do not use any products that contain nicotine or tobacco. These products include cigarettes, chewing tobacco, and vaping devices, such as e-cigarettes. If you need help quitting, ask your health care provider. Avoid secondhand smoke. General instructions Use a humidifier if the air in your home is dry. Avoid coughing or clearing your throat. Do not whisper. Whispering can cause muscle strain. Do not speak in a loud or harsh voice. Rest your voice. If recommended by your health care provider, schedule an appointment with a speech-language specialist. This specialist may give you methods to  try that can help you avoid misusing your voice. Contact a health care provider if: Your voice is hoarse longer than 2 weeks. You almost lose or completely lose your voice for more than 3 days. You have pain when you swallow or try to talk. You feel a lump in your neck. Get help right away if: You have trouble swallowing. You feel like you are choking when you swallow. You cough up blood or vomit blood. You have trouble breathing. You choke, cannot swallow, or cannot breathe if you lie flat. You notice swelling or a rash on your body, face, or tongue. These symptoms may represent a serious problem that is an emergency. Do not wait to see if the symptoms will go away. Get medical help right away. Call your local emergency services (911 in the [place] Do not drive yourself to the hospital. Summary Hoarseness, also called dysphonia, is any abnormal change in your voice that can make it difficult to speak. Your voice may sound raspy, breathy, or strained. Hoarseness is caused by a problem with your vocal cords (vocal folds). Do not speak in a loud or harsh voice, whisper, use nicotine or tobacco products, or eat foods that give you heartburn. See your health care provider if your hoarseness does not improve after 2 weeks. This information is not intended to replace advice given to you by your health care provider. Make sure you discuss any questions you have with your health care provider. Document Revised: 10/15/2020 Document Reviewed: 10/15/2020 Elsevier Patient Education  [place].    Hearing Loss Hearing loss is a partial or total  loss of the ability to hear. This can be temporary or permanent, and it can happen in one or both ears. Medical care is necessary to treat hearing loss properly and to prevent the condition from getting worse. Your hearing may partially or completely come back, depending on what caused your hearing loss and how severe it is. In some cases, hearing loss  is permanent. What are the causes? Common causes of hearing loss include: Too much wax in the ear canal. Infection of the ear canal or middle ear. Fluid in the middle ear. Injury to the ear or surrounding area. An object stuck in the ear. A history of prolonged exposure to loud sounds, such as music. Less common causes of hearing loss include: Tumors in the ear. Viral or bacterial infections, such as meningitis. A hole in the eardrum (perforated eardrum). Problems with the hearing nerve that sends signals between the brain and the ear. Certain medicines. What are the signs or symptoms? Symptoms of this condition may include: Difficulty telling the difference between sounds. Difficulty following a conversation when there is background noise. Lack of response to sounds in your environment. This may be most noticeable when you do not respond to startling sounds. Needing to turn up the volume on the television, radio, or other devices. Ringing in the ears. Dizziness. How is this diagnosed? This condition is diagnosed based on: A physical exam. A hearing test (audiometry). The audiometry test will be performed by a hearing specialist (audiologist). You may also be referred to an ear, nose, and throat (ENT) specialist (otolaryngologist). How is this treated? Treatment for hearing loss may include: Ear wax removal. Medicines to treat or prevent infection (antibiotics). Medicines to reduce inflammation (corticosteroids). Hearing aids for hearing loss related to nerve damage. Follow these instructions at home: If you were prescribed an antibiotic medicine, take it as told by your health care provider. Do not stop taking the antibiotic even if you start to feel better. Take over-the-counter and prescription medicines only as told by your health care provider. Avoid loud noises. Return to your normal activities as told by your health care provider. Ask your health care provider what  activities are safe for you. Keep all follow-up visits as told by your health care provider. This is important. Contact a health care provider if: You feel dizzy. You develop new symptoms. You vomit or feel nauseous. You have a fever. Get help right away if: You develop sudden changes in your vision. You have severe ear pain. You have new or increased weakness. You have a severe headache. Summary Hearing loss is a decreased ability to hear sounds around you. It can be temporary or permanent. Treatment will depend on the cause of your hearing loss. It may include ear wax removal, medicines, or a hearing aid. Your hearing may partially or completely come back, depending on what caused your hearing loss and how severe it is. Keep all follow-up visits as told by your health care provider. This is important. This information is not intended to replace advice given to you by your health care provider. Make sure you discuss any questions you have with your health care provider. Document Revised: 01/25/2018 Document Reviewed: 01/25/2018 Elsevier Patient Education  [place].

## 2021-05-29 NOTE — Progress Notes (Signed)
Chief Complaint  Patient presents with   Ear Fullness    Patient in the hospital for COVID 19 ands pneumonia 05/08/21. Was then diagnosed with an ear infection. Patient experiencing hearing loss and was told that she has wax build up in both ears as well.    B/l ear fullness and hearing loss since covid 04/2021 and covid pneumonia dx ear infection in hospital given augmentin bid x 5 days and covid had remdesivir she is still fatigue but starting working past Tuesday at [employer] instead of 6 hrs daily  Used ear drops debrox and right ear pain resolved   Voice is hoarse also since covid and she is a smoker x 50 years    Review of Systems  Constitutional:  Negative for weight loss.  HENT:  Positive for hearing loss.   Eyes:  Negative for blurred vision.  Respiratory:  Negative for shortness of breath.   Cardiovascular:  Negative for chest pain.  Gastrointestinal:  Negative for abdominal pain and blood in stool.  Genitourinary:  Negative for dysuria.  Musculoskeletal:  Negative for falls and joint pain.  Skin:  Negative for rash.  Neurological:  Negative for headaches.  Psychiatric/Behavioral:  Negative for depression.   Past Medical History:  Diagnosis Date   Family history of breast cancer    Family history of colon cancer    Family history of pancreatic cancer    Hyperlipidemia    220 per patient   Personal history of tobacco use, presenting hazards to health 06/12/2015   Visit for preventive health examination 04/17/2013   .    Past Surgical History:  Procedure Laterality Date   APPENDECTOMY  1968   COLONOSCOPY     COLONOSCOPY     COLONOSCOPY WITH PROPOFOL N/A 09/20/2020   Procedure: COLONOSCOPY WITH PROPOFOL;  Surgeon: Robert Bellow, MD;  Location: [facility] ENDOSCOPY;  Service: Endoscopy;  Laterality: N/A;   Family History  Problem Relation Age of Onset   Cancer Brother 9       pancreatic   Breast cancer Maternal Grandmother 77   Cancer Father 29       colon    Diabetes Sister    Stroke Maternal Aunt    Cancer Paternal Uncle        unk type   Colon polyps Brother    Social History   Socioeconomic History   Marital status: Married    Spouse name: Not on file   Number of children: Not on file   Years of education: Not on file   Highest education level: Not on file  Occupational History   Not on file  Tobacco Use   Smoking status: Every Day    Packs/day: 0.50    Years: 45.00    Pack years: 22.50    Types: Cigarettes   Smokeless tobacco: Never  Substance and Sexual Activity   Alcohol use: No    Alcohol/week: 0.0 standard drinks   Drug use: No   Sexual activity: Not Currently  Other Topics Concern   Not on file  Social History Narrative   Not on file   Social Determinants of Health   Financial Resource Strain: Low Risk    Difficulty of Paying Living Expenses: Not hard at all  Food Insecurity: No Food Insecurity   Worried About Charity fundraiser in the Last Year: Never true   Ran Out of Food in the Last Year: Never true  Transportation Needs: No Transportation Needs  Lack of Transportation (Medical): No   Lack of Transportation (Non-Medical): No  Physical Activity: Sufficiently Active   Days of Exercise per Week: 5 days   Minutes of Exercise per Session: 30 min  Stress: No Stress Concern Present   Feeling of Stress : Not at all  Social Connections: Unknown   Frequency of Communication with Friends and Family: More than three times a week   Frequency of Social Gatherings with Friends and Family: More than three times a week   Attends Religious Services: Not on file   Active Member of Clubs or Organizations: Not on file   Attends Archivist Meetings: Not on file   Marital Status: Widowed  Human resources officer Violence: Not At Risk   Fear of Current or Ex-Partner: No   Emotionally Abused: No   Physically Abused: No   Sexually Abused: No   Current Meds  Medication Sig   aspirin 81 MG tablet Take 81 mg by mouth  daily.   atorvastatin (LIPITOR) 20 MG tablet TAKE 1 TABLET BY MOUTH EVERY DAY   Calcium Carbonate-Vit D-Min (CALCIUM 1200 PO) Take 1 capsule by mouth daily.   carbamide peroxide (DEBROX) 6.5 % OTIC solution Place 5 drops into the right ear 2 (two) times daily.   cholecalciferol (VITAMIN D) 1000 units tablet Take 1,000 Units by mouth daily.   fluticasone (FLONASE) 50 MCG/ACT nasal spray Place 2 sprays into both nostrils daily.   LACTOBACILLUS PROBIOTIC PO Take 1 capsule by mouth daily.   loratadine (CLARITIN) 10 MG tablet Take 1 tablet (10 mg total) by mouth daily.   LORazepam (ATIVAN) 1 MG tablet TAKE 1 TABLET (1 MG TOTAL) BY MOUTH 2 (TWO) TIMES DAILY. AS NEEDED FOR INSOMNIA AND ANXIETY   PARoxetine (PAXIL) 10 MG tablet TAKE 1 TABLET (10 MG TOTAL) BY MOUTH DAILY. AFTER DINNER   predniSONE (DELTASONE) 20 MG tablet Take 2 tablets (40 mg total) by mouth daily with breakfast. 5-7 days   sodium chloride (OCEAN) 0.65 % SOLN nasal spray Place 2 sprays into both nostrils daily as needed for congestion.   No Known Allergies Recent Results (from the past 2160 hour(s))  CBC with Differential     Status: Abnormal   Collection Time: 05/08/21  1:29 PM  Result Value Ref Range   WBC 13.9 (H) 4.0 - 10.5 K/uL   RBC 3.90 3.87 - 5.11 MIL/uL   Hemoglobin 11.9 (L) 12.0 - 15.0 g/dL   HCT 37.1 36.0 - 46.0 %   MCV 95.1 80.0 - 100.0 fL   MCH 30.5 26.0 - 34.0 pg   MCHC 32.1 30.0 - 36.0 g/dL   RDW 13.9 11.5 - 15.5 %   Platelets 270 150 - 400 K/uL   nRBC 0.0 0.0 - 0.2 %   Neutrophils Relative % 90 %   Neutro Abs 12.4 (H) 1.7 - 7.7 K/uL   Lymphocytes Relative 4 %   Lymphs Abs 0.5 (L) 0.7 - 4.0 K/uL   Monocytes Relative 6 %   Monocytes Absolute 0.8 0.1 - 1.0 K/uL   Eosinophils Relative 0 %   Eosinophils Absolute 0.0 0.0 - 0.5 K/uL   Basophils Relative 0 %   Basophils Absolute 0.0 0.0 - 0.1 K/uL   WBC Morphology MILD LEFT SHIFT (1-5% METAS, OCC MYELO, OCC BANDS)    RBC Morphology MORPHOLOGY UNREMARKABLE     Smear Review Normal platelet morphology    Immature Granulocytes 0 %   Abs Immature Granulocytes 0.05 0.00 - 0.07 K/uL  Comment: Performed at [facility], [address] [place], [place] [address]  Basic metabolic panel     Status: Abnormal   Collection Time: 05/08/21  1:29 PM  Result Value Ref Range   Sodium 141 135 - 145 mmol/L   Potassium 3.2 (L) 3.5 - 5.1 mmol/L   Chloride 105 98 - 111 mmol/L   CO2 29 22 - 32 mmol/L   Glucose, Bld 146 (H) 70 - 99 mg/dL    Comment: Glucose reference range applies only to samples taken after fasting for at least 8 hours.   BUN 16 8 - 23 mg/dL   Creatinine, Ser 0.66 0.44 - 1.00 mg/dL   Calcium 8.1 (L) 8.9 - 10.3 mg/dL   GFR, Estimated >60 >60 mL/min    Comment: (NOTE) Calculated using the CKD-EPI Creatinine Equation (2021)    Anion gap 7 5 - 15    Comment: Performed at [facility], [address] [place], [place] [address]  Magnesium     Status: None   Collection Time: 05/08/21  1:29 PM  Result Value Ref Range   Magnesium 2.1 1.7 - 2.4 mg/dL    Comment: Performed at [facility], [address] [place], [place] [address]  TSH     Status: None   Collection Time: 05/08/21  1:29 PM  Result Value Ref Range   TSH 0.537 0.350 - 4.500 uIU/mL    Comment: Performed by a 3rd Generation assay with a functional sensitivity of <=0.01 uIU/mL. Performed at [facility], [address] [place], [place] [address]   Procalcitonin - Baseline     Status: None   Collection Time: 05/08/21  1:29 PM  Result Value Ref Range   Procalcitonin 0.22 ng/mL    Comment:        Interpretation: PCT (Procalcitonin) <= 0.5 ng/mL: Systemic infection (sepsis) is not likely. Local bacterial infection is possible. (NOTE)       Sepsis PCT Algorithm           Lower Respiratory Tract                                      Infection PCT Algorithm    ----------------------------     ----------------------------         PCT < 0.25 ng/mL                 PCT < 0.10 ng/mL          Strongly encourage             Strongly discourage   discontinuation of antibiotics    initiation of antibiotics    ----------------------------     -----------------------------       PCT 0.25 - 0.50 ng/mL            PCT 0.10 - 0.25 ng/mL               OR       >80% decrease in PCT            Discourage initiation of                                            antibiotics      Encourage discontinuation  of antibiotics    ----------------------------     -----------------------------         PCT >= 0.50 ng/mL              PCT 0.26 - 0.50 ng/mL               AND        <80% decrease in PCT             Encourage initiation of                                             antibiotics       Encourage continuation           of antibiotics    ----------------------------     -----------------------------        PCT >= 0.50 ng/mL                  PCT > 0.50 ng/mL               AND         increase in PCT                  Strongly encourage                                      initiation of antibiotics    Strongly encourage escalation           of antibiotics                                     -----------------------------                                           PCT <= 0.25 ng/mL                                                 OR                                        > 80% decrease in PCT                                      Discontinue / Do not initiate                                             antibiotics  Performed at [facility], [address] [place], [place] [address]   Blood Culture (routine x 2)     Status: Abnormal   Collection Time: 05/08/21  1:57 PM   Specimen: BLOOD  Result Value Ref Range   Specimen  Description      BLOOD RIGHT ANTECUBITAL Performed at [facility], [address] [place], [place] [address]    Special Requests      BOTTLES DRAWN AEROBIC AND ANAEROBIC Blood Culture adequate  volume Performed at [facility], [address] [place], [place] [address]    Culture  Setup Time      GRAM NEGATIVE RODS IN BOTH AEROBIC AND ANAEROBIC BOTTLES Organism ID to follow CRITICAL RESULT CALLED TO, READ BACK BY AND VERIFIED WITH: NATHAN BELUE AT 0100 05/10/2021 DLB Performed at [facility], [address], [place], [place] [address]    Culture (A)     HAEMOPHILUS INFLUENZAE BETA LACTAMASE POSITIVE HEALTH DEPARTMENT NOTIFIED Referred to [facility] in [place], [place] for serotyping. Performed at [facility], [place] [address], [place], [place] [address]    Report Status 05/27/2021 FINAL   Urinalysis, Complete w Microscopic Urine, Clean Catch     Status: Abnormal   Collection Time: 05/08/21  1:57 PM  Result Value Ref Range   Color, Urine YELLOW (A) YELLOW   APPearance HAZY (A) CLEAR   Specific Gravity, Urine 1.020 1.005 - 1.030   pH 6.0 5.0 - 8.0   Glucose, UA 50 (A) NEGATIVE mg/dL   Hgb urine dipstick SMALL (A) NEGATIVE   Bilirubin Urine NEGATIVE NEGATIVE   Ketones, ur NEGATIVE NEGATIVE mg/dL   Protein, ur 100 (A) NEGATIVE mg/dL   Nitrite NEGATIVE NEGATIVE   Leukocytes,Ua NEGATIVE NEGATIVE   RBC / HPF 6-10 0 - 5 RBC/hpf   WBC, UA 6-10 0 - 5 WBC/hpf   Bacteria, UA RARE (A) NONE SEEN   Squamous Epithelial / LPF 0-5 0 - 5   Mucus PRESENT    Hyaline Casts, UA PRESENT     Comment: Performed at [facility], [address] [place], [place] [address]  Blood Culture ID Panel (Reflexed)     Status: Abnormal   Collection Time: 05/08/21  1:57 PM  Result Value Ref Range   Enterococcus faecalis NOT DETECTED NOT DETECTED   Enterococcus Faecium NOT DETECTED NOT DETECTED   Listeria monocytogenes NOT DETECTED NOT DETECTED   Staphylococcus species NOT DETECTED NOT DETECTED   Staphylococcus aureus (BCID) NOT DETECTED NOT DETECTED   Staphylococcus epidermidis NOT DETECTED NOT DETECTED   Staphylococcus lugdunensis NOT DETECTED NOT  DETECTED   Streptococcus species NOT DETECTED NOT DETECTED   Streptococcus agalactiae NOT DETECTED NOT DETECTED   Streptococcus pneumoniae NOT DETECTED NOT DETECTED   Streptococcus pyogenes NOT DETECTED NOT DETECTED   A.calcoaceticus-baumannii NOT DETECTED NOT DETECTED   Bacteroides fragilis NOT DETECTED NOT DETECTED   Enterobacterales NOT DETECTED NOT DETECTED   Enterobacter cloacae complex NOT DETECTED NOT DETECTED   Escherichia coli NOT DETECTED NOT DETECTED   Klebsiella aerogenes NOT DETECTED NOT DETECTED   Klebsiella oxytoca NOT DETECTED NOT DETECTED   Klebsiella pneumoniae NOT DETECTED NOT DETECTED   Proteus species NOT DETECTED NOT DETECTED   Salmonella species NOT DETECTED NOT DETECTED   Serratia marcescens NOT DETECTED NOT DETECTED   Haemophilus influenzae DETECTED (A) NOT DETECTED    Comment: CRITICAL RESULT CALLED TO, READ BACK BY AND VERIFIED WITH: NATHAN BELUE AT 0100 05/10/2021 DLB    Neisseria meningitidis NOT DETECTED NOT DETECTED   Pseudomonas aeruginosa NOT DETECTED NOT DETECTED   Stenotrophomonas maltophilia NOT DETECTED NOT DETECTED   Candida albicans NOT DETECTED NOT DETECTED   Candida auris NOT DETECTED NOT DETECTED   Candida glabrata NOT DETECTED NOT  DETECTED   Candida krusei NOT DETECTED NOT DETECTED   Candida parapsilosis NOT DETECTED NOT DETECTED   Candida tropicalis NOT DETECTED NOT DETECTED   Cryptococcus neoformans/gattii NOT DETECTED NOT DETECTED    Comment: Performed at [facility], [address] [place], [place] [address]  Lactic acid, plasma     Status: None   Collection Time: 05/08/21  6:54 PM  Result Value Ref Range   Lactic Acid, Venous 1.2 0.5 - 1.9 mmol/L    Comment: Performed at [facility], [address], [place], [place] [address]  Blood Culture (routine x 2)     Status: None   Collection Time: 05/08/21  8:10 PM   Specimen: BLOOD  Result Value Ref Range   Specimen Description BLOOD BLOOD LEFT HAND    Special  Requests      BOTTLES DRAWN AEROBIC AND ANAEROBIC Blood Culture adequate volume   Culture      NO GROWTH 5 DAYS Performed at [facility], [address], [place], [place] [address]    Report Status 05/13/2021 FINAL   Lactic acid, plasma     Status: None   Collection Time: 05/08/21  8:10 PM  Result Value Ref Range   Lactic Acid, Venous 1.3 0.5 - 1.9 mmol/L    Comment: Performed at [facility], [address] [place], [place] [address]  Lactic acid, plasma     Status: None   Collection Time: 05/09/21  7:24 AM  Result Value Ref Range   Lactic Acid, Venous 1.3 0.5 - 1.9 mmol/L    Comment: Performed at [facility], [address] [place], [place] [address]  Comprehensive metabolic panel     Status: Abnormal   Collection Time: 05/09/21  7:24 AM  Result Value Ref Range   Sodium 140 135 - 145 mmol/L   Potassium 4.2 3.5 - 5.1 mmol/L   Chloride 105 98 - 111 mmol/L   CO2 28 22 - 32 mmol/L   Glucose, Bld 123 (H) 70 - 99 mg/dL    Comment: Glucose reference range applies only to samples taken after fasting for at least 8 hours.   BUN 15 8 - 23 mg/dL   Creatinine, Ser 0.50 0.44 - 1.00 mg/dL   Calcium 7.5 (L) 8.9 - 10.3 mg/dL   Total Protein 5.8 (L) 6.5 - 8.1 g/dL   Albumin 3.0 (L) 3.5 - 5.0 g/dL   AST 32 15 - 41 U/L   ALT 26 0 - 44 U/L   Alkaline Phosphatase 57 38 - 126 U/L   Total Bilirubin 0.4 0.3 - 1.2 mg/dL   GFR, Estimated >60 >60 mL/min    Comment: (NOTE) Calculated using the CKD-EPI Creatinine Equation (2021)    Anion gap 7 5 - 15    Comment: Performed at [facility], [address] [place],  [address]  CBC     Status: Abnormal   Collection Time: 05/09/21  7:24 AM  Result Value Ref Range   WBC 14.5 (H) 4.0 - 10.5 K/uL   RBC 3.91 3.87 - 5.11 MIL/uL   Hemoglobin 12.1 12.0 - 15.0 g/dL   HCT 37.4 36.0 - 46.0 %   MCV 95.7 80.0 - 100.0 fL   MCH 30.9 26.0 - 34.0 pg   MCHC 32.4 30.0 - 36.0 g/dL   RDW 14.0 11.5 - 15.5 %    Platelets 293 150 - 400 K/uL   nRBC 0.0 0.0 - 0.2 %    Comment: Performed  at [facility], [address] [place], [place] [address]  Procalcitonin     Status: None   Collection Time: 05/09/21  7:24 AM  Result Value Ref Range   Procalcitonin 1.88 ng/mL    Comment:        Interpretation: PCT > 0.5 ng/mL and <= 2 ng/mL: Systemic infection (sepsis) is possible, but other conditions are known to elevate PCT as well. (NOTE)       Sepsis PCT Algorithm           Lower Respiratory Tract                                      Infection PCT Algorithm    ----------------------------     ----------------------------         PCT < 0.25 ng/mL                PCT < 0.10 ng/mL          Strongly encourage             Strongly discourage   discontinuation of antibiotics    initiation of antibiotics    ----------------------------     -----------------------------       PCT 0.25 - 0.50 ng/mL            PCT 0.10 - 0.25 ng/mL               OR       >80% decrease in PCT            Discourage initiation of                                            antibiotics      Encourage discontinuation           of antibiotics    ----------------------------     -----------------------------         PCT >= 0.50 ng/mL              PCT 0.26 - 0.50 ng/mL                AND       <80% decrease in PCT             Encourage initiation of                                             antibiotics       Encourage continuation           of antibiotics    ----------------------------     -----------------------------        PCT >= 0.50 ng/mL                  PCT > 0.50 ng/mL               AND         increase in PCT                  Strongly encourage  initiation of antibiotics    Strongly encourage escalation           of antibiotics                                     -----------------------------                                           PCT <= 0.25 ng/mL                                                  OR                                        > 80% decrease in PCT                                      Discontinue / Do not initiate                                             antibiotics  Performed at [facility], [address] [place], [place] [address]   Procalcitonin     Status: None   Collection Time: 05/09/21  7:24 AM  Result Value Ref Range   Procalcitonin 1.93 ng/mL    Comment:        Interpretation: PCT > 0.5 ng/mL and <= 2 ng/mL: Systemic infection (sepsis) is possible, but other conditions are known to elevate PCT as well. (NOTE)       Sepsis PCT Algorithm           Lower Respiratory Tract                                      Infection PCT Algorithm    ----------------------------     ----------------------------         PCT < 0.25 ng/mL                PCT < 0.10 ng/mL          Strongly encourage             Strongly discourage   discontinuation of antibiotics    initiation of antibiotics    ----------------------------     -----------------------------       PCT 0.25 - 0.50 ng/mL            PCT 0.10 - 0.25 ng/mL               OR       >80% decrease in PCT            Discourage initiation of  antibiotics      Encourage discontinuation           of antibiotics    ----------------------------     -----------------------------         PCT >= 0.50 ng/mL              PCT 0.26 - 0.50 ng/mL                AND       <80% decrease in PCT             Encourage initiation of                                             antibiotics       Encourage continuation           of antibiotics    ----------------------------     -----------------------------        PCT >= 0.50 ng/mL                  PCT > 0.50 ng/mL               AND         increase in PCT                  Strongly encourage                                      initiation of antibiotics    Strongly encourage escalation           of  antibiotics                                     -----------------------------                                           PCT <= 0.25 ng/mL                                                 OR                                        > 80% decrease in PCT                                      Discontinue / Do not initiate                                             antibiotics  Performed at [facility], [address], [place], [place] [address]   Protime-INR     Status: None   Collection Time: 05/09/21  7:24 AM  Result Value Ref Range   Prothrombin Time 14.1 11.4 - 15.2 seconds   INR 1.1 0.8 - 1.2    Comment: (NOTE) INR goal varies based on device and disease states. Performed at [facility], [address] [place], [place] [address]   APTT     Status: None   Collection Time: 05/09/21  7:24 AM  Result Value Ref Range   aPTT 29 24 - 36 seconds    Comment: Performed at [facility], [place], [place] [address]  Respiratory (~20 pathogens) panel by PCR     Status: None   Collection Time: 05/09/21  1:32 PM   Specimen: Nasopharyngeal Swab; Respiratory  Result Value Ref Range   Adenovirus NOT DETECTED NOT DETECTED   Coronavirus 229E NOT DETECTED NOT DETECTED    Comment: (NOTE) The Coronavirus on the Respiratory Panel, DOES NOT test for the novel  Coronavirus (2019 nCoV)    Coronavirus HKU1 NOT DETECTED NOT DETECTED   Coronavirus NL63 NOT DETECTED NOT DETECTED   Coronavirus OC43 NOT DETECTED NOT DETECTED   Metapneumovirus NOT DETECTED NOT DETECTED   Rhinovirus / Enterovirus NOT DETECTED NOT DETECTED   Influenza A NOT DETECTED NOT DETECTED   Influenza B NOT DETECTED NOT DETECTED   Parainfluenza Virus 1 NOT DETECTED NOT DETECTED   Parainfluenza Virus 2 NOT DETECTED NOT DETECTED   Parainfluenza Virus 3 NOT DETECTED NOT DETECTED   Parainfluenza Virus 4 NOT DETECTED NOT DETECTED   Respiratory Syncytial Virus NOT DETECTED NOT DETECTED    Bordetella pertussis NOT DETECTED NOT DETECTED   Bordetella Parapertussis NOT DETECTED NOT DETECTED   Chlamydophila pneumoniae NOT DETECTED NOT DETECTED   Mycoplasma pneumoniae NOT DETECTED NOT DETECTED    Comment: Performed at [facility], [place] [place], [place] [address]  Lactic acid, plasma     Status: None   Collection Time: 05/09/21  2:42 PM  Result Value Ref Range   Lactic Acid, Venous 0.9 0.5 - 1.9 mmol/L    Comment: Performed at [facility], [address] [place], [place] [address]  Comprehensive metabolic panel     Status: Abnormal   Collection Time: 05/10/21  5:17 AM  Result Value Ref Range   Sodium 142 135 - 145 mmol/L   Potassium 4.0 3.5 - 5.1 mmol/L   Chloride 108 98 - 111 mmol/L   CO2 29 22 - 32 mmol/L   Glucose, Bld 130 (H) 70 - 99 mg/dL    Comment: Glucose reference range applies only to samples taken after fasting for at least 8 hours.   BUN 28 (H) 8 - 23 mg/dL   Creatinine, Ser 0.54 0.44 - 1.00 mg/dL   Calcium 7.7 (L) 8.9 - 10.3 mg/dL   Total Protein 5.7 (L) 6.5 - 8.1 g/dL   Albumin 2.8 (L) 3.5 - 5.0 g/dL   AST 24 15 - 41 U/L   ALT 25 0 - 44 U/L   Alkaline Phosphatase 61 38 - 126 U/L   Total Bilirubin 0.6 0.3 - 1.2 mg/dL   GFR, Estimated >60 >60 mL/min    Comment: (NOTE) Calculated using the CKD-EPI Creatinine Equation (2021)    Anion gap 5 5 - 15    Comment: Performed at [facility], [address] [place], [place] [address]  CBC     Status: Abnormal   Collection Time: 05/10/21  5:17 AM  Result Value Ref Range   WBC 19.8 (H) 4.0 -  10.5 K/uL   RBC 4.06 3.87 - 5.11 MIL/uL   Hemoglobin 12.1 12.0 - 15.0 g/dL   HCT 38.5 36.0 - 46.0 %   MCV 94.8 80.0 - 100.0 fL   MCH 29.8 26.0 - 34.0 pg   MCHC 31.4 30.0 - 36.0 g/dL   RDW 13.6 11.5 - 15.5 %   Platelets 346 150 - 400 K/uL   nRBC 0.0 0.0 - 0.2 %    Comment: Performed at [facility], [address] [place], [place] [address]  Procalcitonin     Status: None    Collection Time: 05/10/21  5:17 AM  Result Value Ref Range   Procalcitonin 1.60 ng/mL    Comment:        Interpretation: PCT > 0.5 ng/mL and <= 2 ng/mL: Systemic infection (sepsis) is possible, but other conditions are known to elevate PCT as well. (NOTE)       Sepsis PCT Algorithm           Lower Respiratory Tract                                      Infection PCT Algorithm    ----------------------------     ----------------------------         PCT < 0.25 ng/mL                PCT < 0.10 ng/mL          Strongly encourage             Strongly discourage   discontinuation of antibiotics    initiation of antibiotics    ----------------------------     -----------------------------       PCT 0.25 - 0.50 ng/mL            PCT 0.10 - 0.25 ng/mL               OR       >80% decrease in PCT            Discourage initiation of                                            antibiotics      Encourage discontinuation           of antibiotics    ----------------------------     -----------------------------         PCT >= 0.50 ng/mL              PCT 0.26 - 0.50 ng/mL                AND       <80% decrease in PCT             Encourage initiation of                                             antibiotics       Encourage continuation           of antibiotics    ----------------------------     -----------------------------        PCT >= 0.50 ng/mL  PCT > 0.50 ng/mL               AND         increase in PCT                  Strongly encourage                                      initiation of antibiotics    Strongly encourage escalation           of antibiotics                                     -----------------------------                                           PCT <= 0.25 ng/mL                                                 OR                                        > 80% decrease in PCT                                      Discontinue / Do not initiate                                              antibiotics  Performed at [facility], [address], [place], [place] [address]   Magnesium     Status: Abnormal   Collection Time: 05/10/21  5:17 AM  Result Value Ref Range   Magnesium 2.8 (H) 1.7 - 2.4 mg/dL    Comment: Performed at [facility], [address] [place], [place] [address]  C-reactive protein     Status: Abnormal   Collection Time: 05/10/21  5:17 AM  Result Value Ref Range   CRP 5.7 (H) <1.0 mg/dL    Comment: Performed at [facility], [place] [address] [place], [place] [address]  Comprehensive metabolic panel     Status: Abnormal   Collection Time: 05/11/21  6:58 AM  Result Value Ref Range   Sodium 139 135 - 145 mmol/L   Potassium 3.8 3.5 - 5.1 mmol/L   Chloride 103 98 - 111 mmol/L   CO2 28 22 - 32 mmol/L   Glucose, Bld 104 (H) 70 - 99 mg/dL    Comment: Glucose reference range applies only to samples taken after fasting for at least 8 hours.   BUN 23 8 - 23 mg/dL   Creatinine, Ser 0.52 0.44 - 1.00 mg/dL   Calcium 7.8 (L) 8.9 - 10.3 mg/dL   Total Protein 5.5 (L) 6.5 - 8.1 g/dL   Albumin  2.7 (L) 3.5 - 5.0 g/dL   AST 23 15 - 41 U/L   ALT 24 0 - 44 U/L   Alkaline Phosphatase 58 38 - 126 U/L   Total Bilirubin 0.5 0.3 - 1.2 mg/dL   GFR, Estimated >60 >60 mL/min    Comment: (NOTE) Calculated using the CKD-EPI Creatinine Equation (2021)    Anion gap 8 5 - 15    Comment: Performed at [facility], [address] [place], [place] [address]  CBC     Status: Abnormal   Collection Time: 05/11/21  6:58 AM  Result Value Ref Range   WBC 19.3 (H) 4.0 - 10.5 K/uL   RBC 3.80 (L) 3.87 - 5.11 MIL/uL   Hemoglobin 11.7 (L) 12.0 - 15.0 g/dL   HCT 35.9 (L) 36.0 - 46.0 %   MCV 94.5 80.0 - 100.0 fL   MCH 30.8 26.0 - 34.0 pg   MCHC 32.6 30.0 - 36.0 g/dL   RDW 13.6 11.5 - 15.5 %   Platelets 387 150 - 400 K/uL   nRBC 0.0 0.0 - 0.2 %    Comment: Performed at [facility], [address]  [place], [place] [address]  C-reactive protein     Status: Abnormal   Collection Time: 05/11/21  6:58 AM  Result Value Ref Range   CRP 2.0 (H) <1.0 mg/dL    Comment: Performed at [facility] [address], [place], [place] [address]  Comprehensive metabolic panel     Status: Abnormal   Collection Time: 05/12/21  4:25 AM  Result Value Ref Range   Sodium 137 135 - 145 mmol/L   Potassium 3.8 3.5 - 5.1 mmol/L   Chloride 104 98 - 111 mmol/L   CO2 28 22 - 32 mmol/L   Glucose, Bld 109 (H) 70 - 99 mg/dL    Comment: Glucose reference range applies only to samples taken after fasting for at least 8 hours.   BUN 18 8 - 23 mg/dL   Creatinine, Ser 0.50 0.44 - 1.00 mg/dL   Calcium 7.6 (L) 8.9 - 10.3 mg/dL   Total Protein 5.1 (L) 6.5 - 8.1 g/dL   Albumin 2.6 (L) 3.5 - 5.0 g/dL   AST 22 15 - 41 U/L   ALT 24 0 - 44 U/L   Alkaline Phosphatase 51 38 - 126 U/L   Total Bilirubin 0.6 0.3 - 1.2 mg/dL   GFR, Estimated >60 >60 mL/min    Comment: (NOTE) Calculated using the CKD-EPI Creatinine Equation (2021)    Anion gap 5 5 - 15    Comment: Performed at [facility], [address] [place], [place] [address]  CBC     Status: Abnormal   Collection Time: 05/12/21  4:25 AM  Result Value Ref Range   WBC 17.2 (H) 4.0 - 10.5 K/uL   RBC 3.93 3.87 - 5.11 MIL/uL   Hemoglobin 12.0 12.0 - 15.0 g/dL   HCT 36.6 36.0 - 46.0 %   MCV 93.1 80.0 - 100.0 fL   MCH 30.5 26.0 - 34.0 pg   MCHC 32.8 30.0 - 36.0 g/dL   RDW 13.4 11.5 - 15.5 %   Platelets 405 (H) 150 - 400 K/uL   nRBC 0.0 0.0 - 0.2 %    Comment: Performed at [facility], [address], [place], [place] [address]  C-reactive protein     Status: Abnormal   Collection Time: 05/12/21  4:25 AM  Result Value Ref Range  CRP 1.2 (H) <1.0 mg/dL    Comment: Performed at [facility] [address], [place], [place] [address]  CULTURE, BLOOD (ROUTINE X 2) w Reflex to ID Panel     Status: None   Collection Time: 05/12/21  2:50 PM    Specimen: BLOOD  Result Value Ref Range   Specimen Description BLOOD RIGHT AC    Special Requests      BOTTLES DRAWN AEROBIC AND ANAEROBIC Blood Culture adequate volume   Culture      NO GROWTH 5 DAYS Performed at [facility], [address] [place], [place] [address]    Report Status 05/17/2021 FINAL   CULTURE, BLOOD (ROUTINE X 2) w Reflex to ID Panel     Status: None   Collection Time: 05/12/21  2:50 PM   Specimen: BLOOD  Result Value Ref Range   Specimen Description BLOOD RIGHT HAND    Special Requests      BOTTLES DRAWN AEROBIC AND ANAEROBIC Blood Culture adequate volume   Culture      NO GROWTH 5 DAYS Performed at [facility], [address] [place], [place] [address]    Report Status 05/17/2021 FINAL   Comprehensive metabolic panel     Status: Abnormal   Collection Time: 05/13/21  4:44 AM  Result Value Ref Range   Sodium 137 135 - 145 mmol/L   Potassium 4.0 3.5 - 5.1 mmol/L   Chloride 100 98 - 111 mmol/L   CO2 30 22 - 32 mmol/L   Glucose, Bld 110 (H) 70 - 99 mg/dL    Comment: Glucose reference range applies only to samples taken after fasting for at least 8 hours.   BUN 18 8 - 23 mg/dL   Creatinine, Ser 0.67 0.44 - 1.00 mg/dL   Calcium 8.0 (L) 8.9 - 10.3 mg/dL   Total Protein 6.3 (L) 6.5 - 8.1 g/dL   Albumin 3.2 (L) 3.5 - 5.0 g/dL   AST 32 15 - 41 U/L   ALT 35 0 - 44 U/L   Alkaline Phosphatase 62 38 - 126 U/L   Total Bilirubin 0.8 0.3 - 1.2 mg/dL   GFR, Estimated >60 >60 mL/min    Comment: (NOTE) Calculated using the CKD-EPI Creatinine Equation (2021)    Anion gap 7 5 - 15    Comment: Performed at [facility], [address] [place], [place] [address]  CBC     Status: Abnormal   Collection Time: 05/13/21  4:44 AM  Result Value Ref Range   WBC 21.3 (H) 4.0 - 10.5 K/uL   RBC 4.76 3.87 - 5.11 MIL/uL   Hemoglobin 14.2 12.0 - 15.0 g/dL   HCT 44.3 36.0 - 46.0 %   MCV 93.1 80.0 - 100.0 fL   MCH 29.8 26.0 - 34.0 pg   MCHC  32.1 30.0 - 36.0 g/dL   RDW 13.2 11.5 - 15.5 %   Platelets 509 (H) 150 - 400 K/uL   nRBC 0.0 0.0 - 0.2 %    Comment: Performed at [facility], [address] [place], [place] [address]  C-reactive protein     Status: Abnormal   Collection Time: 05/13/21  4:44 AM  Result Value Ref Range   CRP 1.0 (H) <1.0 mg/dL    Comment: Performed at [facility], [place] [address] [place], [place] [address]  Glucose, capillary     Status: Abnormal   Collection Time: 05/13/21  4:04 PM  Result Value  Ref Range   Glucose-Capillary 196 (H) 70 - 99 mg/dL    Comment: Glucose reference range applies only to samples taken after fasting for at least 8 hours.  C-reactive protein     Status: None   Collection Time: 05/14/21  6:51 AM  Result Value Ref Range   CRP 0.7 <1.0 mg/dL    Comment: Performed at [facility], [place] [address] [place], [place] [address]  CBC     Status: Abnormal   Collection Time: 05/14/21  6:51 AM  Result Value Ref Range   WBC 22.0 (H) 4.0 - 10.5 K/uL   RBC 4.56 3.87 - 5.11 MIL/uL   Hemoglobin 13.6 12.0 - 15.0 g/dL   HCT 41.3 36.0 - 46.0 %   MCV 90.6 80.0 - 100.0 fL   MCH 29.8 26.0 - 34.0 pg   MCHC 32.9 30.0 - 36.0 g/dL   RDW 13.2 11.5 - 15.5 %   Platelets 464 (H) 150 - 400 K/uL   nRBC 0.0 0.0 - 0.2 %    Comment: Performed at [facility], [address] [place], [place] [address]  Basic metabolic panel     Status: Abnormal   Collection Time: 05/14/21  6:51 AM  Result Value Ref Range   Sodium 137 135 - 145 mmol/L   Potassium 3.9 3.5 - 5.1 mmol/L   Chloride 101 98 - 111 mmol/L   CO2 28 22 - 32 mmol/L   Glucose, Bld 97 70 - 99 mg/dL    Comment: Glucose reference range applies only to samples taken after fasting for at least 8 hours.   BUN 18 8 - 23 mg/dL   Creatinine, Ser 0.64 0.44 - 1.00 mg/dL   Calcium 8.1 (L) 8.9 - 10.3 mg/dL   GFR, Estimated >60 >60 mL/min    Comment: (NOTE) Calculated using the CKD-EPI Creatinine Equation (2021)     Anion gap 8 5 - 15    Comment: Performed at [facility], [address] [place], [place] [address]  Creatinine, serum     Status: None   Collection Time: 05/15/21  6:23 AM  Result Value Ref Range   Creatinine, Ser 0.60 0.44 - 1.00 mg/dL   GFR, Estimated >60 >60 mL/min    Comment: (NOTE) Calculated using the CKD-EPI Creatinine Equation (2021) Performed at [facility], [address] [place], [place] [address]   CBC     Status: Abnormal   Collection Time: 05/15/21  6:23 AM  Result Value Ref Range   WBC 18.8 (H) 4.0 - 10.5 K/uL   RBC 4.47 3.87 - 5.11 MIL/uL   Hemoglobin 13.6 12.0 - 15.0 g/dL   HCT 40.3 36.0 - 46.0 %   MCV 90.2 80.0 - 100.0 fL   MCH 30.4 26.0 - 34.0 pg   MCHC 33.7 30.0 - 36.0 g/dL   RDW 13.2 11.5 - 15.5 %   Platelets 449 (H) 150 - 400 K/uL   nRBC 0.0 0.0 - 0.2 %    Comment: Performed at [facility], [address] [place], [place] [address]  CBC with Differential/Platelet     Status: None   Collection Time: 05/23/21  4:31 PM  Result Value Ref Range   WBC 10.4 3.8 - 10.8 Thousand/uL   RBC 4.26 3.80 - 5.10 Million/uL   Hemoglobin 13.1 11.7 - 15.5 g/dL   HCT 39.0 35.0 - 45.0 %   MCV 91.5 80.0 - 100.0 fL   MCH 30.8 27.0 - 33.0  pg   MCHC 33.6 32.0 - 36.0 g/dL   RDW 12.8 11.0 - 15.0 %   Platelets 334 140 - 400 Thousand/uL   MPV 10.2 7.5 - 12.5 fL   Neutro Abs 6,614 1,500 - 7,800 cells/uL   Lymphs Abs 2,777 850 - 3,900 cells/uL   Absolute Monocytes 863 200 - 950 cells/uL   Eosinophils Absolute 52 15 - 500 cells/uL   Basophils Absolute 94 0 - 200 cells/uL   Neutrophils Relative % 63.6 %   Total Lymphocyte 26.7 %   Monocytes Relative 8.3 %   Eosinophils Relative 0.5 %   Basophils Relative 0.9 %  Comprehensive metabolic panel     Status: None   Collection Time: 05/23/21  4:31 PM  Result Value Ref Range   Glucose, Bld 82 65 - 99 mg/dL    Comment: .            Fasting reference interval .    BUN 19 7 - 25 mg/dL   Creat  0.60 0.60 - 1.00 mg/dL   BUN/Creatinine Ratio NOT APPLICABLE 6 - 22 (calc)   Sodium 141 135 - 146 mmol/L   Potassium 4.5 3.5 - 5.3 mmol/L   Chloride 103 98 - 110 mmol/L   CO2 30 20 - 32 mmol/L   Calcium 9.5 8.6 - 10.4 mg/dL   Total Protein 6.4 6.1 - 8.1 g/dL   Albumin 3.8 3.6 - 5.1 g/dL   Globulin 2.6 1.9 - 3.7 g/dL (calc)   AG Ratio 1.5 1.0 - 2.5 (calc)   Total Bilirubin 0.7 0.2 - 1.2 mg/dL   Alkaline phosphatase (APISO) 44 37 - 153 U/L   AST 20 10 - 35 U/L   ALT 21 6 - 29 U/L   Objective  Body mass index is 18.07 kg/m. Wt Readings from Last 3 Encounters:  05/29/21 91 lb (41.3 kg)  05/23/21 91 lb 6.4 oz (41.5 kg)  05/08/21 93 lb 11.1 oz (42.5 kg)   Temp Readings from Last 3 Encounters:  05/29/21 (!) 97 F (36.1 C) (Temporal)  05/23/21 97.6 F (36.4 C) (Oral)  05/15/21 97.8 F (36.6 C) (Oral)   BP Readings from Last 3 Encounters:  05/29/21 112/76  05/23/21 122/74  05/15/21 129/73   Pulse Readings from Last 3 Encounters:  05/29/21 67  05/23/21 90  05/15/21 88    Physical Exam Vitals and nursing note reviewed.  Constitutional:      Appearance: Normal appearance. She is well-developed and well-groomed.  HENT:     Head: Normocephalic and atraumatic.     Ears:     Comments: Mild wax left ear not impacted  Eyes:     Conjunctiva/sclera: Conjunctivae normal.     Pupils: Pupils are equal, round, and reactive to light.  Cardiovascular:     Rate and Rhythm: Normal rate and regular rhythm.     Heart sounds: Normal heart sounds. No murmur heard. Pulmonary:     Effort: Pulmonary effort is normal.     Breath sounds: Normal breath sounds.  Abdominal:     General: Abdomen is flat. Bowel sounds are normal.     Tenderness: There is no abdominal tenderness.  Musculoskeletal:        General: No tenderness.  Skin:    General: Skin is warm and dry.  Neurological:     General: No focal deficit present.     Mental Status: She is alert and oriented to person, place, and  time. Mental status is at baseline.  Cranial Nerves: Cranial nerves 2-12 are intact.     Gait: Gait is intact.  Psychiatric:        Attention and Perception: Attention and perception normal.        Mood and Affect: Mood and affect normal.        Speech: Speech normal.        Behavior: Behavior normal. Behavior is cooperative.        Thought Content: Thought content normal.        Cognition and Memory: Cognition and memory normal.        Judgment: Judgment normal.    Assessment  Plan  Labyrinthitis of both ears vs ETD but no fluid level s/p covid 04/2021 - Plan: predniSONE (DELTASONE) 40 MG tablet x 5-7 days, NS, fluticasone (FLONASE) 50 MCG/ACT nasal spray Discuss otc sudafed x 3 days or less   Hoarseness of voice   Consider ENT for both pt wants to hold referral for now Provider: Dr. Olivia Mackie McLean-Scocuzza-Internal Medicine

## 2021-05-30 DIAGNOSIS — J14 Pneumonia due to Hemophilus influenzae: Secondary | ICD-10-CM | POA: Diagnosis not present

## 2021-05-30 DIAGNOSIS — H6690 Otitis media, unspecified, unspecified ear: Secondary | ICD-10-CM | POA: Diagnosis not present

## 2021-05-30 DIAGNOSIS — J9601 Acute respiratory failure with hypoxia: Secondary | ICD-10-CM | POA: Diagnosis not present

## 2021-05-30 DIAGNOSIS — A413 Sepsis due to Hemophilus influenzae: Secondary | ICD-10-CM | POA: Diagnosis not present

## 2021-05-30 DIAGNOSIS — J1282 Pneumonia due to coronavirus disease 2019: Secondary | ICD-10-CM | POA: Diagnosis not present

## 2021-05-30 DIAGNOSIS — U071 COVID-19: Secondary | ICD-10-CM | POA: Diagnosis not present

## 2021-06-20 DIAGNOSIS — H6690 Otitis media, unspecified, unspecified ear: Secondary | ICD-10-CM | POA: Diagnosis not present

## 2021-06-20 DIAGNOSIS — J14 Pneumonia due to Hemophilus influenzae: Secondary | ICD-10-CM | POA: Diagnosis not present

## 2021-06-20 DIAGNOSIS — J1282 Pneumonia due to coronavirus disease 2019: Secondary | ICD-10-CM | POA: Diagnosis not present

## 2021-06-20 DIAGNOSIS — D75839 Thrombocytosis, unspecified: Secondary | ICD-10-CM | POA: Diagnosis not present

## 2021-06-20 DIAGNOSIS — A413 Sepsis due to Hemophilus influenzae: Secondary | ICD-10-CM | POA: Diagnosis not present

## 2021-06-20 DIAGNOSIS — U071 COVID-19: Secondary | ICD-10-CM | POA: Diagnosis not present

## 2021-06-20 DIAGNOSIS — J9601 Acute respiratory failure with hypoxia: Secondary | ICD-10-CM | POA: Diagnosis not present

## 2021-06-20 DIAGNOSIS — F411 Generalized anxiety disorder: Secondary | ICD-10-CM | POA: Diagnosis not present

## 2021-06-20 DIAGNOSIS — F32A Depression, unspecified: Secondary | ICD-10-CM | POA: Diagnosis not present

## 2021-06-20 DIAGNOSIS — Z7982 Long term (current) use of aspirin: Secondary | ICD-10-CM | POA: Diagnosis not present

## 2021-06-20 DIAGNOSIS — E785 Hyperlipidemia, unspecified: Secondary | ICD-10-CM | POA: Diagnosis not present

## 2021-06-20 DIAGNOSIS — F17210 Nicotine dependence, cigarettes, uncomplicated: Secondary | ICD-10-CM | POA: Diagnosis not present

## 2021-06-23 ENCOUNTER — Other Ambulatory Visit: Payer: Medicare Other

## 2021-06-23 ENCOUNTER — Ambulatory Visit (INDEPENDENT_AMBULATORY_CARE_PROVIDER_SITE_OTHER): Payer: Medicare Other

## 2021-06-23 ENCOUNTER — Other Ambulatory Visit: Payer: Self-pay

## 2021-06-23 DIAGNOSIS — J189 Pneumonia, unspecified organism: Secondary | ICD-10-CM | POA: Diagnosis not present

## 2021-06-23 DIAGNOSIS — J439 Emphysema, unspecified: Secondary | ICD-10-CM | POA: Diagnosis not present

## 2021-06-24 ENCOUNTER — Telehealth: Payer: Self-pay | Admitting: Internal Medicine

## 2021-06-24 NOTE — Telephone Encounter (Signed)
Pt returning call. Pt is calling about results. Pt requesting callback

## 2021-06-26 NOTE — Telephone Encounter (Signed)
See result note.  

## 2021-07-01 ENCOUNTER — Other Ambulatory Visit: Payer: Self-pay | Admitting: Internal Medicine

## 2021-07-01 DIAGNOSIS — Z1231 Encounter for screening mammogram for malignant neoplasm of breast: Secondary | ICD-10-CM

## 2021-07-09 DIAGNOSIS — Z20822 Contact with and (suspected) exposure to covid-19: Secondary | ICD-10-CM | POA: Diagnosis not present

## 2021-08-05 ENCOUNTER — Other Ambulatory Visit: Payer: Self-pay

## 2021-08-05 ENCOUNTER — Ambulatory Visit
Admission: RE | Admit: 2021-08-05 | Discharge: 2021-08-05 | Disposition: A | Discharge status: Home / Self Care | Payer: Medicare Other | Source: Ambulatory Visit | Attending: Internal Medicine | Admitting: Internal Medicine

## 2021-08-05 DIAGNOSIS — Z1231 Encounter for screening mammogram for malignant neoplasm of breast: Secondary | ICD-10-CM | POA: Diagnosis not present

## 2021-08-26 DIAGNOSIS — Z20822 Contact with and (suspected) exposure to covid-19: Secondary | ICD-10-CM | POA: Diagnosis not present

## 2021-09-08 DIAGNOSIS — H2513 Age-related nuclear cataract, bilateral: Secondary | ICD-10-CM | POA: Diagnosis not present

## 2021-10-09 ENCOUNTER — Ambulatory Visit (INDEPENDENT_AMBULATORY_CARE_PROVIDER_SITE_OTHER): Payer: Medicare Other

## 2021-10-09 DIAGNOSIS — M810 Age-related osteoporosis without current pathological fracture: Secondary | ICD-10-CM

## 2021-10-09 MED ORDER — DENOSUMAB 60 MG/ML ~~LOC~~ SOSY
60.0000 mg | PREFILLED_SYRINGE | Freq: Once | SUBCUTANEOUS | Status: AC
  Administered 2021-10-09: 60 mg via SUBCUTANEOUS

## 2021-10-09 NOTE — Progress Notes (Signed)
Kimberly Schmitt presents today for injection per MD orders. Prolia Injection administered SQ in right Upper Arm. Administration without incident. Patient tolerated well. Jasani Dolney,cma

## 2021-12-03 ENCOUNTER — Ambulatory Visit (INDEPENDENT_AMBULATORY_CARE_PROVIDER_SITE_OTHER): Payer: Medicare Other

## 2021-12-03 VITALS — Ht 59.5 in | Wt 91.0 lb

## 2021-12-03 DIAGNOSIS — Z0000 Encounter for general adult medical examination without abnormal findings: Secondary | ICD-10-CM

## 2021-12-03 NOTE — Progress Notes (Addendum)
Subjective:   Kimberly Schmitt is a 71 y.o. female who presents for Medicare Annual (Subsequent) preventive examination.  Review of Systems    No ROS.  Medicare Wellness Virtual Visit.  Visual/audio telehealth visit, UTA vital signs.   See social history for additional risk factors.   Cardiac Risk Factors include: advanced age (>66mn, >>60women)     Objective:    Today's Vitals   12/03/21 0824  Weight: 91 lb (41.3 kg)  Height: 4' 11.5" (1.511 m)   Body mass index is 18.07 kg/m.     12/03/2021    8:28 AM 02/21/2021    9:45 AM 12/02/2020    9:15 AM 09/20/2020    7:41 AM 01/13/2020    9:01 PM 11/30/2019    9:16 AM 11/29/2018    9:13 AM  Advanced Directives  Does Patient Have a Medical Advance Directive? Yes No Yes Yes Yes No Yes  Type of AParamedicof AFriendshipLiving will  [place]underboltLiving will Living will [place]ast LakeLiving will  [place]wood ManorLiving will  Does patient want to make changes to medical advance directive? No - Patient declined  No - Patient declined    No - Patient declined  Copy of HBelle Fontainein Chart? No - copy requested  No - copy requested    No - copy requested  Would patient like information on creating a medical advance directive?  No - Patient declined    No - Patient declined     Current Medications (verified) Outpatient Encounter Medications as of 12/03/2021  Medication Sig   albuterol (VENTOLIN HFA) 108 (90 Base) MCG/ACT inhaler Inhale 2 puffs into the lungs every 6 (six) hours as needed for wheezing or shortness of breath. (Patient not taking: Reported on 05/29/2021)   aspirin 81 MG tablet Take 81 mg by mouth daily.   atorvastatin (LIPITOR) 20 MG tablet TAKE 1 TABLET BY MOUTH EVERY DAY   Calcium Carbonate-Vit D-Min (CALCIUM 1200 PO) Take 1 capsule by mouth daily.   carbamide peroxide (DEBROX) 6.5 % OTIC solution Place 5 drops into the right ear 2 (two) times  daily.   cholecalciferol (VITAMIN D) 1000 units tablet Take 1,000 Units by mouth daily.   fluticasone (FLONASE) 50 MCG/ACT nasal spray Place 2 sprays into both nostrils daily.   LACTOBACILLUS PROBIOTIC PO Take 1 capsule by mouth daily.   loratadine (CLARITIN) 10 MG tablet Take 1 tablet (10 mg total) by mouth daily.   LORazepam (ATIVAN) 1 MG tablet TAKE 1 TABLET (1 MG TOTAL) BY MOUTH 2 (TWO) TIMES DAILY. AS NEEDED FOR INSOMNIA AND ANXIETY   PARoxetine (PAXIL) 10 MG tablet TAKE 1 TABLET (10 MG TOTAL) BY MOUTH DAILY. AFTER DINNER   sodium chloride (OCEAN) 0.65 % SOLN nasal spray Place 2 sprays into both nostrils daily as needed for congestion.   [DISCONTINUED] predniSONE (DELTASONE) 20 MG tablet Take 2 tablets (40 mg total) by mouth daily with breakfast. 5-7 days   No facility-administered encounter medications on file as of 12/03/2021.    Allergies (verified) Patient has no known allergies.   History: Past Medical History:  Diagnosis Date   Family history of breast cancer    Family history of colon cancer    Family history of pancreatic cancer    Hyperlipidemia    220 per patient   Personal history of tobacco use, presenting hazards to health 06/12/2015   Visit for preventive health examination 04/17/2013   .  Past Surgical History:  Procedure Laterality Date   APPENDECTOMY  1968   COLONOSCOPY     COLONOSCOPY     COLONOSCOPY WITH PROPOFOL N/A 09/20/2020   Procedure: COLONOSCOPY WITH PROPOFOL;  Surgeon: Robert Bellow, MD;  Location: [facility] ENDOSCOPY;  Service: Endoscopy;  Laterality: N/A;   Family History  Problem Relation Age of Onset   Cancer Brother 58       pancreatic   Breast cancer Maternal Grandmother 13   Cancer Father 29       colon   Diabetes Sister    Stroke Maternal Aunt    Cancer Paternal Uncle        unk type   Colon polyps Brother    Social History   Socioeconomic History   Marital status: Married    Spouse name: Not on file   Number of children: Not  on file   Years of education: Not on file   Highest education level: Not on file  Occupational History   Not on file  Tobacco Use   Smoking status: Every Day    Packs/day: 0.50    Years: 45.00    Total pack years: 22.50    Types: Cigarettes   Smokeless tobacco: Never  Substance and Sexual Activity   Alcohol use: No    Alcohol/week: 0.0 standard drinks of alcohol   Drug use: No   Sexual activity: Not Currently  Other Topics Concern   Not on file  Social History Narrative   Not on file   Social Determinants of Health   Financial Resource Strain: Low Risk  (12/03/2021)   Overall Financial Resource Strain (CARDIA)    Difficulty of Paying Living Expenses: Not hard at all  Food Insecurity: No Food Insecurity (12/03/2021)   Hunger Vital Sign    Worried About Running Out of Food in the Last Year: Never true    Ran Out of Food in the Last Year: Never true  Transportation Needs: No Transportation Needs (12/03/2021)   PRAPARE - Hydrologist (Medical): No    Lack of Transportation (Non-Medical): No  Physical Activity: Sufficiently Active (12/02/2020)   Exercise Vital Sign    Days of Exercise per Week: 5 days    Minutes of Exercise per Session: 30 min  Stress: No Stress Concern Present (12/03/2021)   Walker    Feeling of Stress : Not at all  Social Connections: Unknown (12/03/2021)   Social Connection and Isolation Panel [NHANES]    Frequency of Communication with Friends and Family: More than three times a week    Frequency of Social Gatherings with Friends and Family: More than three times a week    Attends Religious Services: Not on file    Active Member of Clubs or Organizations: Not on file    Attends Archivist Meetings: Not on file    Marital Status: Widowed    Tobacco Counseling Ready to quit: Not Answered Counseling given: Not Answered   Clinical  Intake:  Pre-visit preparation completed: Yes        Diabetes: No  How often do you need to have someone help you when you read instructions, pamphlets, or other written materials from your doctor or pharmacy?: 1 - Never    Activities of Daily Living    12/03/2021    8:25 AM 05/09/2021    5:00 PM  In your present state of health, do you have any  difficulty performing the following activities:  Hearing? 0 0  Vision? 0 0  Difficulty concentrating or making decisions? 0 0  Walking or climbing stairs? 0 0  Dressing or bathing? 0 0  Doing errands, shopping? 0 0  Preparing Food and eating ? N   Using the Toilet? N   In the past six months, have you accidently leaked urine? N   Do you have problems with loss of bowel control? N   Managing your Medications? N   Managing your Finances? N   Housekeeping or managing your Housekeeping? N     Patient Care Team: Crecencio Mc, MD as PCP - General (Internal Medicine)  Indicate any recent Medical Services you may have received from other than [facility] providers in the past year (date may be approximate).     Assessment:   This is a routine wellness examination for [facility].  Virtual Visit via Telephone Note  I connected with  Kimberly Schmitt on 12/03/21 at  8:15 AM EDT by telephone and verified that I am speaking with the correct person using two identifiers.  Location: Patient: home Provider: office Persons participating in the virtual visit: patient/Nurse Health Advisor   I discussed the limitations of performing an evaluation and management service by telehealth. We continued and completed visit with audio only. Some vital signs may be absent or patient reported.   Hearing/Vision screen Hearing Screening - Comments:: Patient is able to hear conversational tones without difficulty. No issues reported. Vision Screening - Comments:: Followed by [facility] Wears corrective lenses They have seen their ophthalmologist in  the last 12 months  Dietary issues and exercise activities discussed: Regular diet Good water intake Current Exercise Habits: Home exercise routine, Type of exercise: walking, Time (Minutes): 30, Frequency (Times/Week): 5, Weekly Exercise (Minutes/Week): 150, Intensity: Mild   Goals Addressed               This Visit's Progress     Patient Stated     COMPLETED: Follow up with Primary Care Provider (pt-stated)        Patient to reschedule annual screenings  Follow with pcp as needed      Quit Smoking (pt-stated)        I would like to try hypnosis to help stop smoking       Depression Screen    12/03/2021    8:26 AM 12/02/2020    9:13 AM 11/30/2019    9:15 AM 11/29/2018    9:14 AM 06/22/2018   10:21 AM 06/16/2017    9:00 AM 05/13/2016    8:18 AM  PHQ 2/9 Scores  PHQ - 2 Score 0 0 1 0 1 0 0  PHQ- 9 Score     2 2     Fall Risk    12/03/2021    8:26 AM 05/23/2021    3:53 PM 12/02/2020    9:37 AM 03/06/2020    8:34 AM 01/16/2020    3:34 PM  Fall Risk   Falls in the past year? 0 0 0 0 0  Number falls in past yr: 0  0    Injury with Fall?   0    Risk for fall due to :  No Fall Risks     Follow up Falls evaluation completed Falls evaluation completed Falls evaluation completed Falls evaluation completed Falls evaluation completed    [place]: Home free of loose throw rugs in walkways, pet beds, electrical  cords, etc? Yes  Adequate lighting in your home to reduce risk of falls? Yes   ASSISTIVE DEVICES UTILIZED TO PREVENT FALLS: Use of a cane, walker or w/c? No   TIMED UP AND GO: Was the test performed? No .   Cognitive Function:  Patient is alert and oriented x3.       12/03/2021    8:43 AM 12/02/2020    9:38 AM 11/30/2019    9:20 AM 11/29/2018    9:15 AM  6CIT Screen  What Year? 0 points 0 points 0 points 0 points  What month? 0 points 0 points 0 points 0 points  What time? 0 points 0 points 0 points 0 points  Count back from 20  0 points 0 points  0 points  Months in reverse 0 points 0 points 0 points 0 points  Repeat phrase 0 points 0 points  0 points  Total Score 0 points 0 points  0 points    Immunizations Immunization History  Administered Date(s) Administered   Fluad Quad(high Dose 65+) 01/16/2021   Influenza Split 01/16/2013, 01/27/2014, 01/24/2015   Influenza Whole 03/04/2011, 01/27/2012   Influenza, High Dose Seasonal PF 02/03/2017, 01/11/2018   Influenza-Unspecified 01/24/2016, 02/05/2017, 02/07/2019, 02/13/2020   PFIZER(Purple Top)SARS-COV-2 Vaccination 07/05/2019, 07/26/2019   Pneumococcal Conjugate-13 05/02/2014   Pneumococcal Polysaccharide-23 04/11/2012, 06/16/2017   Tdap 04/07/2011, 02/21/2021   Zoster Recombinat (Shingrix) 09/26/2017, 02/03/2018   Screening Tests Health Maintenance  Topic Date Due   COVID-19 Vaccine (3 - Pfizer series) 12/19/2021 (Originally 09/20/2019)   INFLUENZA VACCINE  12/09/2021   MAMMOGRAM  08/06/2022   COLONOSCOPY (Pts 45-70yr Insurance coverage will need to be confirmed)  09/20/2025   TETANUS/TDAP  02/22/2031   Pneumonia Vaccine 71 Years old  Completed   DEXA SCAN  Completed   Hepatitis C Screening  Completed   Zoster Vaccines- Shingrix  Completed   HPV VACCINES  Aged Out   Health Maintenance There are no preventive care reminders to display for this patient.  Vision Screening: Recommended annual ophthalmology exams for early detection of glaucoma and other disorders of the eye.  Dental Screening: Recommended annual dental exams for proper oral hygiene  DG Chest 2 View: Completed 06/23/21  Community Resource Referral / Chronic Care Management: CRR required this visit?  No   CCM required this visit?  No      Plan:   Keep all routine maintenance appointments.   I have personally reviewed and noted the following in the patient's chart:   Medical and social history Use of alcohol, tobacco or illicit drugs  Current medications and supplements  including opioid prescriptions.  Functional ability and status Nutritional status Physical activity Advanced directives List of other physicians Hospitalizations, surgeries, and ER visits in previous 12 months Vitals Screenings to include cognitive, depression, and falls Referrals and appointments  In addition, I have reviewed and discussed with patient certain preventive protocols, quality metrics, and best practice recommendations. A written personalized care plan for preventive services as well as general preventive health recommendations were provided to patient.     OBrien-Blaney, Amarrah Meinhart L, LPN   [id number]    I have reviewed the above information and agree with above.   TDeborra Medina MD

## 2021-12-03 NOTE — Patient Instructions (Addendum)
  Kimberly Schmitt , Thank you for taking time to come for your Medicare Wellness Visit. I appreciate your ongoing commitment to your health goals. Please review the following plan we discussed and let me know if I can assist you in the future.   These are the goals we discussed:  Goals       Patient Stated     Quit Smoking (pt-stated)      I would like to try hypnosis to help stop smoking        This is a list of the screening recommended for you and due dates:  Health Maintenance  Topic Date Due   COVID-19 Vaccine (3 - Pfizer series) 12/19/2021*   Flu Shot  12/09/2021   Mammogram  08/06/2022   Colon Cancer Screening  09/20/2025   Tetanus Vaccine  02/22/2031   Pneumonia Vaccine  Completed   DEXA scan (bone density measurement)  Completed   Hepatitis C Screening: USPSTF Recommendation to screen - Ages 4-79 yo.  Completed   Zoster (Shingles) Vaccine  Completed   HPV Vaccine  Aged Out  *Topic was postponed. The date shown is not the original due date.

## 2021-12-08 ENCOUNTER — Ambulatory Visit (INDEPENDENT_AMBULATORY_CARE_PROVIDER_SITE_OTHER): Payer: Medicare Other | Admitting: Family Medicine

## 2021-12-08 ENCOUNTER — Encounter: Payer: Self-pay | Admitting: Family Medicine

## 2021-12-08 VITALS — BP 122/80 | HR 112 | Temp 97.6°F | Ht 59.5 in | Wt 92.0 lb

## 2021-12-08 DIAGNOSIS — R319 Hematuria, unspecified: Secondary | ICD-10-CM | POA: Diagnosis not present

## 2021-12-08 DIAGNOSIS — R310 Gross hematuria: Secondary | ICD-10-CM | POA: Diagnosis not present

## 2021-12-08 LAB — POCT URINALYSIS DIPSTICK
Bilirubin, UA: NEGATIVE
Glucose, UA: NEGATIVE
Ketones, UA: NEGATIVE
Nitrite, UA: NEGATIVE
Protein, UA: POSITIVE — AB
Spec Grav, UA: 1.025 (ref 1.010–1.025)
Urobilinogen, UA: NEGATIVE E.U./dL — AB
pH, UA: 6 (ref 5.0–8.0)

## 2021-12-08 MED ORDER — CEPHALEXIN 250 MG PO CAPS: 250.0000 mg | ORAL_CAPSULE | Freq: Four times a day (QID) | ORAL | 0 refills | Status: AC

## 2021-12-08 NOTE — Progress Notes (Signed)
    SUBJECTIVE:   CHIEF COMPLAINT / HPI: Blood in urine  Patient presents to clinic with complaint of little blood noted in urine.  Noted small amount of light pink after wiping self 2 days ago.  Episodes have been intermittent.  Today she noticed some blood with small amount of blood clots in the toilet and decided to seek medical attention.  Endorses urinary frequency and does not completely empty bladder with each urination.  Denies any fevers, painful urination, abdominal pain, vaginal bleeding, constipation or back pain.  She reports last time she voided just before coming to clinic she did not see any bloody urination.  PERTINENT  PMH / PSH:  Tobacco use   OBJECTIVE:   BP 122/80 (BP Location: Left Arm, Patient Position: Sitting, Cuff Size: Small)   Pulse (!) 112   Temp 97.6 F (36.4 C) (Oral)   Ht 4' 11.5" (1.511 m)   Wt 92 lb (41.7 kg)   SpO2 99%   BMI 18.27 kg/m    General: Alert, no acute distress Cardio: Normal S1 and S2, RRR, no r/m/g Pulm: CTAB, normal work of breathing Abdomen: Bowel sounds normal. Abdomen soft and non-tender.  No CVA tenderness.  ASSESSMENT/PLAN:   Gross hematuria Likely secondary from symptomatic acute cystitis.  Urine dipstick positive for leukocytes, 3+ RBCs.  Abdominal exam benign.  She is tachycardic without hypotension. -UA and urine culture. -Start Keflex 250 mg 4 times daily x5 days -Patient with history of tobacco use, commend repeat UA to UTI resolved.  If continues to have microscopic hematuria recommend further evaluation.  Discussed with patient, she reports has PCP appointment in September and wishes to wait to discuss with primary. -Strict return precautions provided   PDMP reviewed  Carollee Leitz, MD [facility]

## 2021-12-08 NOTE — Patient Instructions (Signed)
It was a pleasure meeting you today. Thank you for allowing me to take part in your health care.  Our goals for today as we discussed include:  For your urinary frequency Your urine results positive for infection.   Start Keflex 250 mg 4 times a day.  Take at breakfast, lunch, dinner and before bed. Sent your urine for further testing, we will notify you of results and if needing to change antibiotics. Increase water intake to prevent against dehydration Continue to monitor for any worsening bloody urine.     Please follow-up with PCP as scheduled.  If you have any questions or concerns, please do not hesitate to call the office at [phone].  I look forward to our next visit and until then take care and stay safe.  Regards,   Carollee Leitz, MD   [facility]

## 2021-12-09 LAB — URINALYSIS, MICROSCOPIC ONLY

## 2021-12-11 LAB — URINE CULTURE
MICRO NUMBER:: 13714957
SPECIMEN QUALITY:: ADEQUATE

## 2021-12-14 ENCOUNTER — Other Ambulatory Visit: Payer: Self-pay | Admitting: Family Medicine

## 2021-12-14 DIAGNOSIS — N390 Urinary tract infection, site not specified: Secondary | ICD-10-CM

## 2021-12-14 NOTE — Progress Notes (Signed)
History of painless hematuria, current smoker. Treated for UTI with Keflex x 5 days Urine culture positive for K PNA and E Coli. Repeat u/a and culture in 3 weeks, future orders placed.  Carollee Leitz, MD [facility]

## 2021-12-15 ENCOUNTER — Encounter: Payer: Self-pay | Admitting: Family Medicine

## 2021-12-15 DIAGNOSIS — R310 Gross hematuria: Secondary | ICD-10-CM | POA: Insufficient documentation

## 2021-12-15 NOTE — Assessment & Plan Note (Addendum)
Likely secondary from symptomatic acute cystitis.  Urine dipstick positive for leukocytes, 3+ RBCs.  Abdominal exam benign.  She is tachycardic without hypotension. -UA and urine culture. -Start Keflex 250 mg 4 times daily x5 days -Patient with history of tobacco use, commend repeat UA to UTI resolved.  If continues to have microscopic hematuria recommend further evaluation.  Discussed with patient, she reports has PCP appointment in September and wishes to wait to discuss with primary. -Strict return precautions provided

## 2021-12-16 NOTE — Progress Notes (Signed)
noted 

## 2022-01-01 ENCOUNTER — Telehealth: Payer: Self-pay | Admitting: *Deleted

## 2022-01-01 NOTE — Patient Outreach (Signed)
  Care Coordination   01/01/2022 Name: Kimberly Schmitt MRN: [id number] DOB: [birth year]   Care Coordination Outreach Attempts:  An unsuccessful telephone outreach was attempted today to offer the patient information about available care coordination services as a benefit of their health plan.   Follow Up Plan:  Additional outreach attempts will be made to offer the patient care coordination information and services.   Encounter Outcome:  No Answer  Care Coordination Interventions Activated:  No   Care Coordination Interventions:  No, not indicated    Emelia Loron RN, BSN [place] [phone] [email or web address]

## 2022-01-14 ENCOUNTER — Encounter: Payer: Self-pay | Admitting: Internal Medicine

## 2022-01-16 ENCOUNTER — Ambulatory Visit (INDEPENDENT_AMBULATORY_CARE_PROVIDER_SITE_OTHER): Payer: Medicare Other

## 2022-01-16 ENCOUNTER — Encounter: Payer: Self-pay | Admitting: Internal Medicine

## 2022-01-16 ENCOUNTER — Other Ambulatory Visit: Payer: Self-pay

## 2022-01-16 ENCOUNTER — Ambulatory Visit (INDEPENDENT_AMBULATORY_CARE_PROVIDER_SITE_OTHER): Payer: Medicare Other | Admitting: Internal Medicine

## 2022-01-16 VITALS — BP 126/80 | HR 91 | Temp 98.0°F | Ht 59.5 in | Wt 90.4 lb

## 2022-01-16 DIAGNOSIS — E7800 Pure hypercholesterolemia, unspecified: Secondary | ICD-10-CM | POA: Diagnosis not present

## 2022-01-16 DIAGNOSIS — R7301 Impaired fasting glucose: Secondary | ICD-10-CM | POA: Diagnosis not present

## 2022-01-16 DIAGNOSIS — F419 Anxiety disorder, unspecified: Secondary | ICD-10-CM

## 2022-01-16 DIAGNOSIS — R7989 Other specified abnormal findings of blood chemistry: Secondary | ICD-10-CM | POA: Diagnosis not present

## 2022-01-16 DIAGNOSIS — M25512 Pain in left shoulder: Secondary | ICD-10-CM | POA: Diagnosis not present

## 2022-01-16 DIAGNOSIS — R5383 Other fatigue: Secondary | ICD-10-CM | POA: Diagnosis not present

## 2022-01-16 DIAGNOSIS — D75839 Thrombocytosis, unspecified: Secondary | ICD-10-CM | POA: Diagnosis not present

## 2022-01-16 DIAGNOSIS — R911 Solitary pulmonary nodule: Secondary | ICD-10-CM | POA: Diagnosis not present

## 2022-01-16 DIAGNOSIS — N390 Urinary tract infection, site not specified: Secondary | ICD-10-CM

## 2022-01-16 DIAGNOSIS — E559 Vitamin D deficiency, unspecified: Secondary | ICD-10-CM | POA: Diagnosis not present

## 2022-01-16 DIAGNOSIS — R310 Gross hematuria: Secondary | ICD-10-CM

## 2022-01-16 DIAGNOSIS — B9689 Other specified bacterial agents as the cause of diseases classified elsewhere: Secondary | ICD-10-CM

## 2022-01-16 DIAGNOSIS — Z720 Tobacco use: Secondary | ICD-10-CM | POA: Diagnosis not present

## 2022-01-16 DIAGNOSIS — F411 Generalized anxiety disorder: Secondary | ICD-10-CM

## 2022-01-16 DIAGNOSIS — F5105 Insomnia due to other mental disorder: Secondary | ICD-10-CM

## 2022-01-16 DIAGNOSIS — I700 Atherosclerosis of aorta: Secondary | ICD-10-CM

## 2022-01-16 DIAGNOSIS — I2510 Atherosclerotic heart disease of native coronary artery without angina pectoris: Secondary | ICD-10-CM | POA: Diagnosis not present

## 2022-01-16 LAB — LDL CHOLESTEROL, DIRECT: Direct LDL: 71 mg/dL

## 2022-01-16 LAB — TSH: TSH: 0.93 u[IU]/mL (ref 0.35–5.50)

## 2022-01-16 LAB — LIPID PANEL
Cholesterol: 141 mg/dL (ref 0–200)
HDL: 60.7 mg/dL (ref >39.00)
LDL Cholesterol: 66 mg/dL (ref 0–99)
NonHDL: 80.37
Total CHOL/HDL Ratio: 2
Triglycerides: 71 mg/dL (ref 0.0–149.0)
VLDL: 14.2 mg/dL (ref 0.0–40.0)

## 2022-01-16 LAB — COMPREHENSIVE METABOLIC PANEL
ALT: 13 U/L (ref 0–35)
AST: 16 U/L (ref 0–37)
Albumin: 4.2 g/dL (ref 3.5–5.2)
Alkaline Phosphatase: 55 U/L (ref 39–117)
BUN: 20 mg/dL (ref 6–23)
CO2: 26 mEq/L (ref 19–32)
Calcium: 9.4 mg/dL (ref 8.4–10.5)
Chloride: 104 mEq/L (ref 96–112)
Creatinine, Ser: 0.77 mg/dL (ref 0.40–1.20)
GFR: 76.76 mL/min (ref >60.00)
Glucose, Bld: 69 mg/dL — ABNORMAL LOW (ref 70–99)
Potassium: 4.3 mEq/L (ref 3.5–5.1)
Sodium: 141 mEq/L (ref 135–145)
Total Bilirubin: 0.4 mg/dL (ref 0.2–1.2)
Total Protein: 6.5 g/dL (ref 6.0–8.3)

## 2022-01-16 LAB — URINALYSIS, ROUTINE W REFLEX MICROSCOPIC
Bilirubin Urine: NEGATIVE
Nitrite: NEGATIVE
Specific Gravity, Urine: <=1.005 — AB (ref 1.000–1.030)
Total Protein, Urine: NEGATIVE
Urine Glucose: NEGATIVE
Urobilinogen, UA: 0.2 (ref 0.0–1.0)
pH: 6 (ref 5.0–8.0)

## 2022-01-16 LAB — CBC WITH DIFFERENTIAL/PLATELET
Basophils Absolute: 0.1 10*3/uL (ref 0.0–0.1)
Basophils Relative: 1 % (ref 0.0–3.0)
Eosinophils Absolute: 0.1 10*3/uL (ref 0.0–0.7)
Eosinophils Relative: 0.9 % (ref 0.0–5.0)
HCT: 40.4 % (ref 36.0–46.0)
Hemoglobin: 13.6 g/dL (ref 12.0–15.0)
Lymphocytes Relative: 17.8 % (ref 12.0–46.0)
Lymphs Abs: 1.7 10*3/uL (ref 0.7–4.0)
MCHC: 33.6 g/dL (ref 30.0–36.0)
MCV: 93.1 fl (ref 78.0–100.0)
Monocytes Absolute: 0.6 10*3/uL (ref 0.1–1.0)
Monocytes Relative: 5.8 % (ref 3.0–12.0)
Neutro Abs: 7.3 10*3/uL (ref 1.4–7.7)
Neutrophils Relative %: 74.5 % (ref 43.0–77.0)
Platelets: 335 10*3/uL (ref 150.0–400.0)
RBC: 4.34 Mil/uL (ref 3.87–5.11)
RDW: 15.1 % (ref 11.5–15.5)
WBC: 9.8 10*3/uL (ref 4.0–10.5)

## 2022-01-16 LAB — HEMOGLOBIN A1C: Hgb A1c MFr Bld: 6.3 % (ref 4.6–6.5)

## 2022-01-16 LAB — VITAMIN D 25 HYDROXY (VIT D DEFICIENCY, FRACTURES): VITD: >120 ng/mL

## 2022-01-16 MED ORDER — LORAZEPAM 1 MG PO TABS
1.0000 mg | ORAL_TABLET | Freq: Every day | ORAL | 5 refills | Status: DC
Start: 2022-01-16 — End: 2022-07-17

## 2022-01-16 MED ORDER — PAROXETINE HCL 10 MG PO TABS: 10.0000 mg | ORAL_TABLET | Freq: Every day | ORAL | 2 refills | Status: DC

## 2022-01-16 NOTE — Progress Notes (Unsigned)
Patient ID: Kimberly Schmitt, female    DOB: December 07, 1950  Age: 71 y.o. MRN: [id number]  The patient is here for  follow up and management of other chronic and acute problems.   The risk factors are reflected in the social history.  The roster of all physicians providing medical care to patient - is listed in the Snapshot section of the chart.  Activities of daily living:  The patient is 100% independent in all ADLs: dressing, toileting, feeding as well as independent mobility  Home safety : The patient has smoke detectors in the home. They wear seatbelts.  There are no firearms at home. There is no violence in the home.   There is no risks for hepatitis, STDs or HIV. There is no   history of blood transfusion. They have no travel history to infectious disease endemic areas of the world.  The patient has seen their dentist in the last six month. They have seen their eye doctor in the last year. They admit to slight hearing difficulty with regard to whispered voices and some television programs.  They have deferred audiologic testing in the last year.  They do not  have excessive sun exposure. Discussed the need for sun protection: hats, long sleeves and use of sunscreen if there is significant sun exposure.   Diet: the importance of a healthy diet is discussed. They do have a healthy diet.  She has a healthy appetite, eats 2 meals daily , snacks occasionally on peanuts   The benefits of regular aerobic exercise were discussed. She  is physically very active on her fram and works 30 hours per week .   Depression screen: there are no signs or vegative symptoms of depression- irritability, change in appetite, anhedonia, sadness/tearfullness.  Cognitive assessment: the patient manages all their financial and personal affairs and is actively engaged. They could relate day,date,year and events; recalled 2/3 objects at 3 minutes; performed clock-face test normally.  The following portions of the  patient's history were reviewed and updated as appropriate: allergies, current medications, past family history, past medical history,  past surgical history, past social history  and problem list.  Visual acuity was not assessed per patient preference since she has regular follow up with her ophthalmologist. Hearing and body mass index were assessed and reviewed.   During the course of the visit the patient was educated and counseled about appropriate screening and preventive services including : fall prevention , diabetes screening, nutrition counseling, colorectal cancer screening, and recommended immunizations.    CC: The primary encounter diagnosis was Pure hypercholesterolemia. Diagnoses of Recurrent UTI, Impaired fasting glucose, Other fatigue, and UTI due to Klebsiella species were also pertinent to this visit.  [name] has a past medical history of Family history of breast cancer, Family history of colon cancer, Family history of pancreatic cancer, Hyperlipidemia, Personal history of tobacco use, presenting hazards to health (06/12/2015), and Visit for preventive health examination (04/17/2013).   She has a past surgical history that includes Appendectomy (1968); Colonoscopy; Colonoscopy; and Colonoscopy with propofol (N/A, 09/20/2020).   Her family history includes Breast cancer (age of onset: 82) in her maternal grandmother; Cancer in her paternal uncle; Cancer (age of onset: 36) in her brother; Cancer (age of onset: 11) in her father; Colon polyps in her brother; Diabetes in her sister; Stroke in her maternal aunt.She reports that she has been smoking cigarettes. She has a 22.50 pack-year smoking history. She has never used smokeless tobacco. She reports that she  does not drink alcohol and does not use drugs.  Outpatient Medications Prior to Visit  Medication Sig Dispense Refill   aspirin 81 MG tablet Take 81 mg by mouth daily.     atorvastatin (LIPITOR) 20 MG tablet TAKE 1 TABLET  BY MOUTH EVERY DAY 90 tablet 3   Calcium Carbonate-Vit D-Min (CALCIUM 1200 PO) Take 1 capsule by mouth daily.     carbamide peroxide (DEBROX) 6.5 % OTIC solution Place 5 drops into the right ear 2 (two) times daily. 15 mL 0   cholecalciferol (VITAMIN D) 1000 units tablet Take 1,000 Units by mouth daily.     fluticasone (FLONASE) 50 MCG/ACT nasal spray Place 2 sprays into both nostrils daily. 16 g 11   LACTOBACILLUS PROBIOTIC PO Take 1 capsule by mouth daily.     loratadine (CLARITIN) 10 MG tablet Take 1 tablet (10 mg total) by mouth daily. 30 tablet 0   LORazepam (ATIVAN) 1 MG tablet TAKE 1 TABLET (1 MG TOTAL) BY MOUTH 2 (TWO) TIMES DAILY. AS NEEDED FOR INSOMNIA AND ANXIETY 30 tablet 2   PARoxetine (PAXIL) 10 MG tablet TAKE 1 TABLET (10 MG TOTAL) BY MOUTH DAILY. AFTER DINNER 90 tablet 2   sodium chloride (OCEAN) 0.65 % SOLN nasal spray Place 2 sprays into both nostrils daily as needed for congestion. 30 mL 11   No facility-administered medications prior to visit.    Review of Systems  Objective:  BP 126/80 (BP Location: Left Arm, Patient Position: Sitting, Cuff Size: Small)   Pulse 91   Temp 98 F (36.7 C) (Oral)   Ht 4' 11.5" (1.511 m)   Wt 90 lb 6.4 oz (41 kg)   SpO2 96%   BMI 17.95 kg/m   Physical Exam  Physical Exam   Assessment & Plan:   Problem List Items Addressed This Visit     Hyperlipidemia - Primary   Relevant Orders   Lipid Profile   Direct LDL   Other Visit Diagnoses     Recurrent UTI       Relevant Orders   Urine Culture   Urinalysis, Routine w reflex microscopic   Impaired fasting glucose       Relevant Orders   Comp Met (CMET)   HgB A1c   Other fatigue       Relevant Orders   CBC with Differential/Platelet   TSH   UTI due to Klebsiella species           I am having Kimberly Schmitt maintain her aspirin, cholecalciferol, Calcium Carbonate-Vit D-Min (CALCIUM 1200 PO), PARoxetine, LACTOBACILLUS PROBIOTIC PO, LORazepam, atorvastatin, carbamide  peroxide, loratadine, sodium chloride, and fluticasone.  No orders of the defined types were placed in this encounter.   There are no discontinued medications.  Follow-up: No follow-ups on file.   Crecencio Mc, MD

## 2022-01-16 NOTE — Patient Instructions (Addendum)
Use the premier protein as a supplement (mix with your favorite ice cream ) NOT AS A MEAL REPLACEMENT   YOUR LEFT SHOULDER appears to have ARTHRITIS IN IT .  X rays today to confirm.  this can cause bone spurring that irritates the joint when your arm is raised You can try using OTC Voltaren gel (diclofenac(   I have made a referral to the pulmonary nodule clinic to follow up on your lung nodule

## 2022-01-16 NOTE — Progress Notes (Signed)
Spoke with pt and advised of lab results . Order for repeat lab for Vitamin D has been placed and pt is scheduling an apt

## 2022-01-18 ENCOUNTER — Encounter: Payer: Self-pay | Admitting: Internal Medicine

## 2022-01-18 DIAGNOSIS — R7989 Other specified abnormal findings of blood chemistry: Secondary | ICD-10-CM | POA: Insufficient documentation

## 2022-01-18 DIAGNOSIS — M25512 Pain in left shoulder: Secondary | ICD-10-CM | POA: Insufficient documentation

## 2022-01-18 NOTE — Assessment & Plan Note (Signed)
She is asymptomatic  .  Continue asa 81 mg daily and  statin therapy for primary prevention

## 2022-01-18 NOTE — Assessment & Plan Note (Signed)
continue paxil daily and lorazepam for prn use to manage insomnia

## 2022-01-18 NOTE — Assessment & Plan Note (Signed)
Reviewed findings of prior CT scan today..  Patient is tolerating high potency statin therapy and LDL is now < 70.   Lab Results  Component Value Date   CHOL 141 01/16/2022   HDL 60.70 01/16/2022   LDLCALC 66 01/16/2022   LDLDIRECT 71.0 01/16/2022   TRIG 71.0 01/16/2022   CHOLHDL 2 01/16/2022

## 2022-01-18 NOTE — Assessment & Plan Note (Signed)
Patient  Is asymptomatic.  Advised to stop all supplements containing Vit D and repeat level in one month

## 2022-01-18 NOTE — Assessment & Plan Note (Signed)
Resolved for the last 6 months or longer   Lab Results  Component Value Date   WBC 9.8 01/16/2022   HGB 13.6 01/16/2022   HCT 40.4 01/16/2022   MCV 93.1 01/16/2022   PLT 335.0 01/16/2022

## 2022-01-18 NOTE — Assessment & Plan Note (Signed)
Strongly encouraged her to take the opportunity to quit smoking

## 2022-01-18 NOTE — Assessment & Plan Note (Signed)
.   She has not used  the lorazepam more than twice weekly . The risks and benefits of benzodiazepine use were discussed with patient today including excessive sedation leading to respiratory depression,  impaired thinking/driving, and addiction.  Patient was advised to avoid concurrent use with alcohol, to use medication only as needed and not to share with others  .

## 2022-01-18 NOTE — Assessment & Plan Note (Signed)
Secondary to UTI diagnosed and treate in early August .   Resolved on repeat assessment

## 2022-01-18 NOTE — Assessment & Plan Note (Signed)
No change by serial CTs . Referring to pulmonary for follow up

## 2022-01-18 NOTE — Assessment & Plan Note (Signed)
Degenerative changes to joint , suggestive of sequelae to subacromial impingement.  By plain films

## 2022-01-19 ENCOUNTER — Encounter: Payer: Self-pay | Admitting: Internal Medicine

## 2022-01-19 ENCOUNTER — Other Ambulatory Visit: Payer: Self-pay | Admitting: Internal Medicine

## 2022-01-19 DIAGNOSIS — N390 Urinary tract infection, site not specified: Secondary | ICD-10-CM | POA: Insufficient documentation

## 2022-01-19 LAB — URINE CULTURE
MICRO NUMBER:: 13891730
SPECIMEN QUALITY:: ADEQUATE

## 2022-01-19 MED ORDER — CIPROFLOXACIN HCL 250 MG PO TABS: 250.0000 mg | ORAL_TABLET | Freq: Two times a day (BID) | ORAL | 0 refills | Status: DC

## 2022-01-21 ENCOUNTER — Telehealth: Payer: Self-pay | Admitting: Internal Medicine

## 2022-01-21 NOTE — Telephone Encounter (Signed)
Pt called wanting to check on CT scan referral . Pt want to be called on her cell phone

## 2022-01-22 NOTE — Telephone Encounter (Signed)
Not seeing where a CT scan has been ordered or what she is needing a CT scan for.

## 2022-01-23 NOTE — Telephone Encounter (Signed)
Where was the pulmonology referral sent so I can give pt the correct number to call them back to schedule her appt.

## 2022-01-27 ENCOUNTER — Telehealth: Payer: Self-pay

## 2022-01-29 NOTE — Telephone Encounter (Signed)
LMTCB. Need to let pt know that the referral was placed on 01/26/2022 and if she does not hear anything in a week to please give [place] a call.

## 2022-01-29 NOTE — Telephone Encounter (Signed)
Pt called back and I read message to pt and she verbalized understanding

## 2022-01-30 NOTE — Telephone Encounter (Signed)
noted 

## 2022-02-06 NOTE — Telephone Encounter (Signed)
Patient states she has not heard from anyone regarding her referral.  Patient states she would like for Adair Laundry, CMA, to please call her when she returns next week.

## 2022-02-11 NOTE — Telephone Encounter (Signed)
Spoke with pt to let her know that the referral was placed on 01/26/2022. Pt stated that she called over there that week and they stated that they did not have it yet but that sometimes it takes a couple of days. Pt was advised to give them a call again tomorrow to see if they have received and if not to please give me a call back. Pt gave a verbal understanding.

## 2022-02-12 NOTE — Telephone Encounter (Signed)
Patient called and wanted office to know she has called pulmonary and has an appointment.

## 2022-02-18 ENCOUNTER — Other Ambulatory Visit: Payer: Self-pay | Admitting: Internal Medicine

## 2022-02-18 ENCOUNTER — Other Ambulatory Visit (INDEPENDENT_AMBULATORY_CARE_PROVIDER_SITE_OTHER): Payer: Medicare Other

## 2022-02-18 DIAGNOSIS — E559 Vitamin D deficiency, unspecified: Secondary | ICD-10-CM

## 2022-02-18 DIAGNOSIS — T452X1D Poisoning by vitamins, accidental (unintentional), subsequent encounter: Secondary | ICD-10-CM

## 2022-02-18 LAB — VITAMIN D 25 HYDROXY (VIT D DEFICIENCY, FRACTURES): VITD: >120 ng/mL

## 2022-02-19 ENCOUNTER — Telehealth: Payer: Self-pay

## 2022-02-19 DIAGNOSIS — E673 Hypervitaminosis D: Secondary | ICD-10-CM

## 2022-02-19 NOTE — Telephone Encounter (Signed)
LMTCB regarding Dr. Lupita Dawn recommendation.

## 2022-02-19 NOTE — Telephone Encounter (Signed)
-----   Message from Crecencio Mc, MD sent at 02/18/2022  1:04 PM EDT ----- Her vitamin D is still off the charts high.  Has she stopped all vitamin D supplementation?  Please tell her that she must STOP ALL NONDIETARY SOURCES OF VITAMIN D

## 2022-02-23 NOTE — Addendum Note (Signed)
Addended by: Crecencio Mc on: 02/23/2022 09:45 AM   Modules accepted: Orders

## 2022-02-23 NOTE — Telephone Encounter (Signed)
Patient states her multivitamin does not have Vitamin D in it.

## 2022-02-23 NOTE — Telephone Encounter (Signed)
Spoke with pt and she stated that she does not take a multi vitamin. She does take a calcium supplement and isn't sure if it has vitamin d in it or not. Pt was at work and stated that when she gets home she will take a look and call us back.

## 2022-02-23 NOTE — Telephone Encounter (Signed)
Pt states she is not taking any vitamin d

## 2022-02-23 NOTE — Telephone Encounter (Signed)
Lab appt has been scheduled. Pt is not taking any vitamin D.

## 2022-02-23 NOTE — Telephone Encounter (Signed)
Patient returned your call she is not taking any vitamin D supplements. If you need to return her call she asked that you call her on her cell phone [phone]. She is at work.

## 2022-02-24 ENCOUNTER — Encounter: Payer: Self-pay | Admitting: Student in an Organized Health Care Education/Training Program

## 2022-02-24 ENCOUNTER — Ambulatory Visit (INDEPENDENT_AMBULATORY_CARE_PROVIDER_SITE_OTHER): Payer: Medicare Other | Admitting: Student in an Organized Health Care Education/Training Program

## 2022-02-24 VITALS — BP 120/60 | HR 81 | Temp 98.1°F | Ht 59.5 in | Wt 93.4 lb

## 2022-02-24 DIAGNOSIS — R918 Other nonspecific abnormal finding of lung field: Secondary | ICD-10-CM | POA: Diagnosis not present

## 2022-02-24 DIAGNOSIS — Z720 Tobacco use: Secondary | ICD-10-CM | POA: Diagnosis not present

## 2022-02-24 DIAGNOSIS — Z23 Encounter for immunization: Secondary | ICD-10-CM

## 2022-02-24 MED ORDER — NICOTINE 14 MG/24HR TD PT24: 14.0000 mg | MEDICATED_PATCH | TRANSDERMAL | 0 refills | Status: AC

## 2022-02-24 MED ORDER — NICOTINE 7 MG/24HR TD PT24: 7.0000 mg | MEDICATED_PATCH | TRANSDERMAL | 0 refills | Status: AC

## 2022-02-24 MED ORDER — NICOTINE 21 MG/24HR TD PT24: 21.0000 mg | MEDICATED_PATCH | TRANSDERMAL | 0 refills | Status: AC

## 2022-02-24 MED ORDER — NICOTINE POLACRILEX 2 MG MT LOZG: 2.0000 mg | LOZENGE | OROMUCOSAL | 3 refills | Status: AC | PRN

## 2022-02-24 NOTE — Patient Instructions (Signed)
The [facility] Quitline: Call 1-800-QUIT-NOW 3851506511). The [place] Quitline is a free service for [employer]. Trained counselors are available from 8 am until 3 am, 365 days per year. Services are available in both [place] and [place].   Web Resources Free online support programs can help you track your progress and share experiences with others who are quitting. These are examples: www.becomeanex.org www.trytostop.org  www.smokefree.gov  www.[employer].com.br.aspx   Tobacco Cessation Medications  Nicotine Replacement Therapy (NRT)  Nicotine is the addictive part of tobacco smoke, but not the most dangerous part. There are 7000 other toxins in cigarettes, including carbon monoxide, that cause disease. People do not generally become addicted to medication. Common problems: People don't use enough medication or stop too early. Medications are safe and effective. Overdose is very uncommon. Use medications as long as needed (3 months minimum). Some combinations work better than single medications. Long acting medications like the NRT patch and bupropion provide continuous treatment for withdrawal symptoms.  PLUS  Short acting medications like the NRT gum, lozenge, inhaler, and nasal spray help people to cope with breakthrough cravings.  ? Nicotine Patch  Place patch on hairless skin on upper body, including arms and back. Each day: discard old patch, shower, apply new patch to a different site. Apply hydrocortisone cream to mildly red/irritated areas. Call provider if rash develops. If patch causes sleep disturbance, remove patch at bedtime and replace each morning after shower. Side effects may include: skin irritation, headache, insomnia, abnormal/vivid dreams.  ? Nicotine Lozenge  Allow to dissolve slowly in mouth (20-30 minutes). Do not chew or swallow. Nicotine release may cause a warm tingling sensation. Occasionally rotate to  different areas of the mouth. Use enough to control cravings, up to 20 lozenges per day (if used alone). Avoid eating or drinking for 15 minutes before using and during use. Side effects may include: nausea, hiccups, cough, heartburn, headache, gas, insomnia.

## 2022-02-24 NOTE — Progress Notes (Signed)
Synopsis: Referred in for pulmonary nodules by Crecencio Mc, MD  Assessment & Plan:   #Pulmonary Nodules:  Most recent CT scan in 04/2021 was in the setting of acute COVID-19 infection for which she was hospitalized. The CT was notable for multiple ground glass and tree in bud nodular opacities consistent with viral pneumonia. Patient does have a history of smoking and will benefit from being re-enrolled in lung cancer screening. Will place referral.  - Ambulatory Referral for Lung Cancer Screening  #Tobacco abuse  Long standing history of smoking with prior successful cessation attempt, albeit relapsed. Counseled patient at length today and she is willing to quit. Will prescribe patches and lozenges.  - nicotine polacrilex (NICOTINE MINI) 2 MG lozenge; Take 1 lozenge (2 mg total) by mouth every 2 (two) hours as needed for smoking cessation.  Dispense: 72 lozenge; Refill: 3 - nicotine (NICODERM CQ - DOSED IN MG/24 HOURS) 21 mg/24hr patch; Place 1 patch (21 mg total) onto the skin daily.  Dispense: 42 patch; Refill: 0 - nicotine (NICODERM CQ - DOSED IN MG/24 HOURS) 14 mg/24hr patch; Place 1 patch (14 mg total) onto the skin daily for 14 days.  Dispense: 14 patch; Refill: 0 - nicotine (NICODERM CQ - DOSED IN MG/24 HR) 7 mg/24hr patch; Place 1 patch (7 mg total) onto the skin daily for 14 days.  Dispense: 14 patch; Refill: 0 - Ambulatory Referral for Lung Cancer Scre  #Need for immunization against influenza  - Flu Vaccine QUAD High Dose(Fluad)   Return in about 6 months (around 08/26/2022).  I spent 60 minutes caring for this patient today, including preparing to see the patient, obtaining and/or reviewing separately obtained history, performing a medically appropriate examination and/or evaluation, counseling and educating the patient/family/caregiver, ordering medications, tests, or procedures, documenting clinical information in the electronic health record, and independently  interpreting results (not separately reported/billed) and communicating results to the patient/family/caregiver  Armando Reichert, MD [facility] 02/24/2022 10:28 AM    End of visit medications:  Meds ordered this encounter  Medications   nicotine polacrilex (NICOTINE MINI) 2 MG lozenge    Sig: Take 1 lozenge (2 mg total) by mouth every 2 (two) hours as needed for smoking cessation.    Dispense:  72 lozenge    Refill:  3   nicotine (NICODERM CQ - DOSED IN MG/24 HOURS) 21 mg/24hr patch    Sig: Place 1 patch (21 mg total) onto the skin daily.    Dispense:  42 patch    Refill:  0   nicotine (NICODERM CQ - DOSED IN MG/24 HOURS) 14 mg/24hr patch    Sig: Place 1 patch (14 mg total) onto the skin daily for 14 days.    Dispense:  14 patch    Refill:  0   nicotine (NICODERM CQ - DOSED IN MG/24 HR) 7 mg/24hr patch    Sig: Place 1 patch (7 mg total) onto the skin daily for 14 days.    Dispense:  14 patch    Refill:  0     Current Outpatient Medications:    aspirin 81 MG tablet, Take 81 mg by mouth daily., Disp: , Rfl:    atorvastatin (LIPITOR) 20 MG tablet, TAKE 1 TABLET BY MOUTH EVERY DAY, Disp: 90 tablet, Rfl: 3   carbamide peroxide (DEBROX) 6.5 % OTIC solution, Place 5 drops into the right ear 2 (two) times daily., Disp: 15 mL, Rfl: 0   fluticasone (FLONASE) 50 MCG/ACT nasal spray,  Place 2 sprays into both nostrils daily., Disp: 16 g, Rfl: 11   LACTOBACILLUS PROBIOTIC PO, Take 1 capsule by mouth daily., Disp: , Rfl:    loratadine (CLARITIN) 10 MG tablet, Take 1 tablet (10 mg total) by mouth daily. (Patient taking differently: Take 10 mg by mouth as needed.), Disp: 30 tablet, Rfl: 0   LORazepam (ATIVAN) 1 MG tablet, Take 1 tablet (1 mg total) by mouth at bedtime. As needed for insomnia and anxiety, Disp: 30 tablet, Rfl: 5   nicotine (NICODERM CQ - DOSED IN MG/24 HOURS) 14 mg/24hr patch, Place 1 patch (14 mg total) onto the skin daily for 14 days., Disp: 14 patch, Rfl:  0   nicotine (NICODERM CQ - DOSED IN MG/24 HOURS) 21 mg/24hr patch, Place 1 patch (21 mg total) onto the skin daily., Disp: 42 patch, Rfl: 0   nicotine (NICODERM CQ - DOSED IN MG/24 HR) 7 mg/24hr patch, Place 1 patch (7 mg total) onto the skin daily for 14 days., Disp: 14 patch, Rfl: 0   nicotine polacrilex (NICOTINE MINI) 2 MG lozenge, Take 1 lozenge (2 mg total) by mouth every 2 (two) hours as needed for smoking cessation., Disp: 72 lozenge, Rfl: 3   PARoxetine (PAXIL) 10 MG tablet, Take 1 tablet (10 mg total) by mouth daily. after dinner, Disp: 90 tablet, Rfl: 2   sodium chloride (OCEAN) 0.65 % SOLN nasal spray, Place 2 sprays into both nostrils daily as needed for congestion., Disp: 30 mL, Rfl: 11   Calcium Carbonate-Vit D-Min (CALCIUM 1200 PO), Take 1 capsule by mouth daily. (Patient not taking: Reported on 02/24/2022), Disp: , Rfl:    cholecalciferol (VITAMIN D) 1000 units tablet, Take 1,000 Units by mouth daily. (Patient not taking: Reported on 02/24/2022), Disp: , Rfl:    Subjective:   PATIENT ID: Kimberly Schmitt GENDER: female DOB: 1951/05/07, MRN: [id number]  Chief Complaint  Patient presents with   pulmonary consult    Last CXR 06/2021--covid 04/2021 required admission.  C/o dry cough at times prod with clear sputum.     HPI  Patient is a pleasant 71 year old female presenting to clinic for the evaluation of pulmonary nodules.  She tells me that she has no symptoms and is overall feeling great.  She does not have any shortness of breath at rest or with exertion.  She does not have any cough, chest tightness, or wheezing.  She is able to do all the activities of her daily living and takes care of her lawn without any shortness of breath.  She was admitted to the hospital in December 2022 for shortness of breath and was diagnosed with COVID.  At the time of the admission, a CT scan of the chest was notable for multiple groundglass and tree-in-bud opacities.  Prior to that, she had  been getting yearly low-dose CT scans of the chest for lung cancer screening but has not had any repeat imaging since December.  She was referred for the evaluation of said nodules.  She is a smoker, having smoked around a pack a day for 54 years.  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  She does not have any pets at the current moment.  Ancillary information including prior medications, full medical/surgical/family/social histories, and PFTs (when available) are listed below and have been reviewed.   Review of Systems  Constitutional:  Negative for chills, fever and weight loss.  Respiratory:  Negative for cough, hemoptysis, sputum production,  shortness of breath and wheezing.   Cardiovascular:  Negative for chest pain, palpitations and leg swelling.  Skin:  Negative for rash.     Objective:   Vitals:   02/24/22 0830  BP: 120/60  Pulse: 81  Temp: 98.1 F (36.7 C)  TempSrc: Temporal  SpO2: 100%  Weight: 93 lb 6.4 oz (42.4 kg)  Height: 4' 11.5" (1.511 m)   100% on RA  BMI Readings from Last 3 Encounters:  02/24/22 18.55 kg/m  01/16/22 17.95 kg/m  12/08/21 18.27 kg/m   Wt Readings from Last 3 Encounters:  02/24/22 93 lb 6.4 oz (42.4 kg)  01/16/22 90 lb 6.4 oz (41 kg)  12/08/21 92 lb (41.7 kg)    Physical Exam Constitutional:      Appearance: Normal appearance. She is normal weight.  HENT:     Head: Normocephalic.     Nose: Nose normal.     Mouth/Throat:     Mouth: Mucous membranes are dry.  Eyes:     Extraocular Movements: Extraocular movements intact.  Cardiovascular:     Rate and Rhythm: Normal rate and regular rhythm.     Pulses: Normal pulses.     Heart sounds: Normal heart sounds.  Pulmonary:     Effort: Pulmonary effort is normal.     Breath sounds: Normal breath sounds.  Abdominal:     General: Abdomen is flat.     Palpations: Abdomen is soft.  Musculoskeletal:     Cervical back: Normal range of motion and  neck supple.  Skin:    General: Skin is warm.  Neurological:     General: No focal deficit present.     Mental Status: She is alert and oriented to person, place, and time. Mental status is at baseline.       Ancillary Information    Past Medical History:  Diagnosis Date   CAP (community acquired pneumonia) 05/08/2021   Family history of breast cancer    Family history of colon cancer    Family history of pancreatic cancer    Hyperlipidemia    220 per patient   Personal history of tobacco use, presenting hazards to health 06/12/2015   Pneumonia due to COVID-19 virus 05/08/2021   Sepsis ([place]) 05/08/2021   Visit for preventive health examination 04/17/2013   .      Family History  Problem Relation Age of Onset   Cancer Brother 40       pancreatic   Breast cancer Maternal Grandmother 44   Cancer Father 58       colon   Diabetes Sister    Stroke Maternal Aunt    Cancer Paternal Uncle        unk type   Colon polyps Brother      Past Surgical History:  Procedure Laterality Date   APPENDECTOMY  1968   COLONOSCOPY     COLONOSCOPY     COLONOSCOPY WITH PROPOFOL N/A 09/20/2020   Procedure: COLONOSCOPY WITH PROPOFOL;  Surgeon: Robert Bellow, MD;  Location: [facility] ENDOSCOPY;  Service: Endoscopy;  Laterality: N/A;    Social History   Socioeconomic History   Marital status: Married    Spouse name: Not on file   Number of children: Not on file   Years of education: Not on file   Highest education level: Not on file  Occupational History   Not on file  Tobacco Use   Smoking status: Every Day    Packs/day: 1.00    Years: 54.00  Total pack years: 54.00    Types: Cigarettes   Smokeless tobacco: Never   Tobacco comments:    0.5PPD 02/24/2022  Substance and Sexual Activity   Alcohol use: No    Alcohol/week: 0.0 standard drinks of alcohol   Drug use: No   Sexual activity: Not Currently  Other Topics Concern   Not on file  Social History Narrative   Not on file    Social Determinants of Health   Financial Resource Strain: Low Risk  (12/03/2021)   Overall Financial Resource Strain (CARDIA)    Difficulty of Paying Living Expenses: Not hard at all  Food Insecurity: No Food Insecurity (12/03/2021)   Hunger Vital Sign    Worried About Running Out of Food in the Last Year: Never true    Ran Out of Food in the Last Year: Never true  Transportation Needs: No Transportation Needs (12/03/2021)   PRAPARE - Hydrologist (Medical): No    Lack of Transportation (Non-Medical): No  Physical Activity: Sufficiently Active (12/02/2020)   Exercise Vital Sign    Days of Exercise per Week: 5 days    Minutes of Exercise per Session: 30 min  Stress: No Stress Concern Present (12/03/2021)   [place]    Feeling of Stress : Not at all  Social Connections: Unknown (12/03/2021)   Social Connection and Isolation Panel [NHANES]    Frequency of Communication with Friends and Family: More than three times a week    Frequency of Social Gatherings with Friends and Family: More than three times a week    Attends Religious Services: Not on file    Active Member of Clubs or Organizations: Not on file    Attends Archivist Meetings: Not on file    Marital Status: Widowed  Intimate Partner Violence: Not At Risk (12/03/2021)   Humiliation, Afraid, Rape, and Kick questionnaire    Fear of Current or Ex-Partner: No    Emotionally Abused: No    Physically Abused: No    Sexually Abused: No     No Known Allergies   CBC    Component Value Date/Time   WBC 9.8 01/16/2022 0926   RBC 4.34 01/16/2022 0926   HGB 13.6 01/16/2022 0926   HCT 40.4 01/16/2022 0926   PLT 335.0 01/16/2022 0926   MCV 93.1 01/16/2022 0926   MCH 30.8 05/23/2021 1631   MCHC 33.6 01/16/2022 0926   RDW 15.1 01/16/2022 0926   LYMPHSABS 1.7 01/16/2022 0926   MONOABS 0.6 01/16/2022 0926   EOSABS 0.1  01/16/2022 0926   BASOSABS 0.1 01/16/2022 8119    Pulmonary Functions Testing Results:     No data to display          Outpatient Medications Prior to Visit  Medication Sig Dispense Refill   aspirin 81 MG tablet Take 81 mg by mouth daily.     atorvastatin (LIPITOR) 20 MG tablet TAKE 1 TABLET BY MOUTH EVERY DAY 90 tablet 3   carbamide peroxide (DEBROX) 6.5 % OTIC solution Place 5 drops into the right ear 2 (two) times daily. 15 mL 0   fluticasone (FLONASE) 50 MCG/ACT nasal spray Place 2 sprays into both nostrils daily. 16 g 11   LACTOBACILLUS PROBIOTIC PO Take 1 capsule by mouth daily.     loratadine (CLARITIN) 10 MG tablet Take 1 tablet (10 mg total) by mouth daily. (Patient taking differently: Take 10 mg by mouth as needed.)  30 tablet 0   LORazepam (ATIVAN) 1 MG tablet Take 1 tablet (1 mg total) by mouth at bedtime. As needed for insomnia and anxiety 30 tablet 5   PARoxetine (PAXIL) 10 MG tablet Take 1 tablet (10 mg total) by mouth daily. after dinner 90 tablet 2   sodium chloride (OCEAN) 0.65 % SOLN nasal spray Place 2 sprays into both nostrils daily as needed for congestion. 30 mL 11   ciprofloxacin (CIPRO) 250 MG tablet Take 1 tablet (250 mg total) by mouth 2 (two) times daily. 6 tablet 0   Calcium Carbonate-Vit D-Min (CALCIUM 1200 PO) Take 1 capsule by mouth daily. (Patient not taking: Reported on 02/24/2022)     cholecalciferol (VITAMIN D) 1000 units tablet Take 1,000 Units by mouth daily. (Patient not taking: Reported on 02/24/2022)     No facility-administered medications prior to visit.

## 2022-03-11 ENCOUNTER — Telehealth: Payer: Self-pay | Admitting: Student in an Organized Health Care Education/Training Program

## 2022-03-11 NOTE — Telephone Encounter (Signed)
Called patient to schedule for LCS.  No answer. Left VM with LCS call back number

## 2022-03-12 ENCOUNTER — Other Ambulatory Visit: Payer: Self-pay

## 2022-03-12 DIAGNOSIS — Z122 Encounter for screening for malignant neoplasm of respiratory organs: Secondary | ICD-10-CM

## 2022-03-12 DIAGNOSIS — F17210 Nicotine dependence, cigarettes, uncomplicated: Secondary | ICD-10-CM

## 2022-03-12 DIAGNOSIS — Z87891 Personal history of nicotine dependence: Secondary | ICD-10-CM

## 2022-03-18 ENCOUNTER — Other Ambulatory Visit (INDEPENDENT_AMBULATORY_CARE_PROVIDER_SITE_OTHER): Payer: Medicare Other

## 2022-03-18 ENCOUNTER — Ambulatory Visit
Admission: RE | Admit: 2022-03-18 | Discharge: 2022-03-18 | Disposition: A | Discharge status: Home / Self Care | Payer: Medicare Other | Source: Ambulatory Visit | Attending: Acute Care | Admitting: Acute Care

## 2022-03-18 DIAGNOSIS — J439 Emphysema, unspecified: Secondary | ICD-10-CM | POA: Diagnosis not present

## 2022-03-18 DIAGNOSIS — I2510 Atherosclerotic heart disease of native coronary artery without angina pectoris: Secondary | ICD-10-CM | POA: Diagnosis not present

## 2022-03-18 DIAGNOSIS — I700 Atherosclerosis of aorta: Secondary | ICD-10-CM | POA: Diagnosis not present

## 2022-03-18 DIAGNOSIS — Z122 Encounter for screening for malignant neoplasm of respiratory organs: Secondary | ICD-10-CM | POA: Insufficient documentation

## 2022-03-18 DIAGNOSIS — K449 Diaphragmatic hernia without obstruction or gangrene: Secondary | ICD-10-CM | POA: Diagnosis not present

## 2022-03-18 DIAGNOSIS — F17210 Nicotine dependence, cigarettes, uncomplicated: Secondary | ICD-10-CM | POA: Diagnosis not present

## 2022-03-18 DIAGNOSIS — E673 Hypervitaminosis D: Secondary | ICD-10-CM

## 2022-03-18 DIAGNOSIS — Z87891 Personal history of nicotine dependence: Secondary | ICD-10-CM

## 2022-03-18 LAB — VITAMIN D 25 HYDROXY (VIT D DEFICIENCY, FRACTURES): VITD: 100.37 ng/mL — ABNORMAL HIGH (ref 30.00–100.00)

## 2022-03-20 ENCOUNTER — Encounter: Payer: Self-pay | Admitting: Internal Medicine

## 2022-03-20 ENCOUNTER — Telehealth: Payer: Self-pay | Admitting: Internal Medicine

## 2022-03-20 LAB — PTH, INTACT AND CALCIUM
Calcium: 9.8 mg/dL (ref 8.6–10.4)
PTH: 19 pg/mL (ref 16–77)

## 2022-03-20 NOTE — Telephone Encounter (Signed)
See result note message 

## 2022-03-20 NOTE — Telephone Encounter (Signed)
Pt called wanting to know her lab results

## 2022-03-23 ENCOUNTER — Other Ambulatory Visit: Payer: Self-pay | Admitting: Acute Care

## 2022-03-23 DIAGNOSIS — Z87891 Personal history of nicotine dependence: Secondary | ICD-10-CM

## 2022-03-23 DIAGNOSIS — F17210 Nicotine dependence, cigarettes, uncomplicated: Secondary | ICD-10-CM

## 2022-03-23 DIAGNOSIS — Z122 Encounter for screening for malignant neoplasm of respiratory organs: Secondary | ICD-10-CM

## 2022-04-06 ENCOUNTER — Telehealth: Payer: Self-pay | Admitting: *Deleted

## 2022-04-06 NOTE — Telephone Encounter (Signed)
$  0 due; no PA required for Prolia injection. Appt scheduled on 04/13/22.  Letter mailed

## 2022-04-13 ENCOUNTER — Ambulatory Visit (INDEPENDENT_AMBULATORY_CARE_PROVIDER_SITE_OTHER): Payer: Medicare Other

## 2022-04-13 DIAGNOSIS — M810 Age-related osteoporosis without current pathological fracture: Secondary | ICD-10-CM

## 2022-04-13 MED ORDER — DENOSUMAB 60 MG/ML ~~LOC~~ SOSY
60.0000 mg | PREFILLED_SYRINGE | Freq: Once | SUBCUTANEOUS | Status: AC
  Administered 2022-04-13: 60 mg via SUBCUTANEOUS

## 2022-04-13 NOTE — Progress Notes (Signed)
Pt presented for their SubQ Prolia injection. Pt was identified through two identifiers. Pt tolerated SubQ injection well in the right arm.

## 2022-05-25 IMAGING — MG MM DIGITAL SCREENING BILAT W/ TOMO AND CAD
8 series · 8 of 24 positions shown · non-contrast
Comparison: Previous exam(s).

CLINICAL DATA: Screening.

EXAM:
DIGITAL SCREENING BILATERAL MAMMOGRAM WITH TOMOSYNTHESIS AND CAD
TECHNIQUE: Bilateral screening digital craniocaudal and mediolateral oblique
mammograms were obtained. Bilateral screening digital breast
tomosynthesis was performed. The images were evaluated with
computer-aided detection.

[L CC synth-2D]
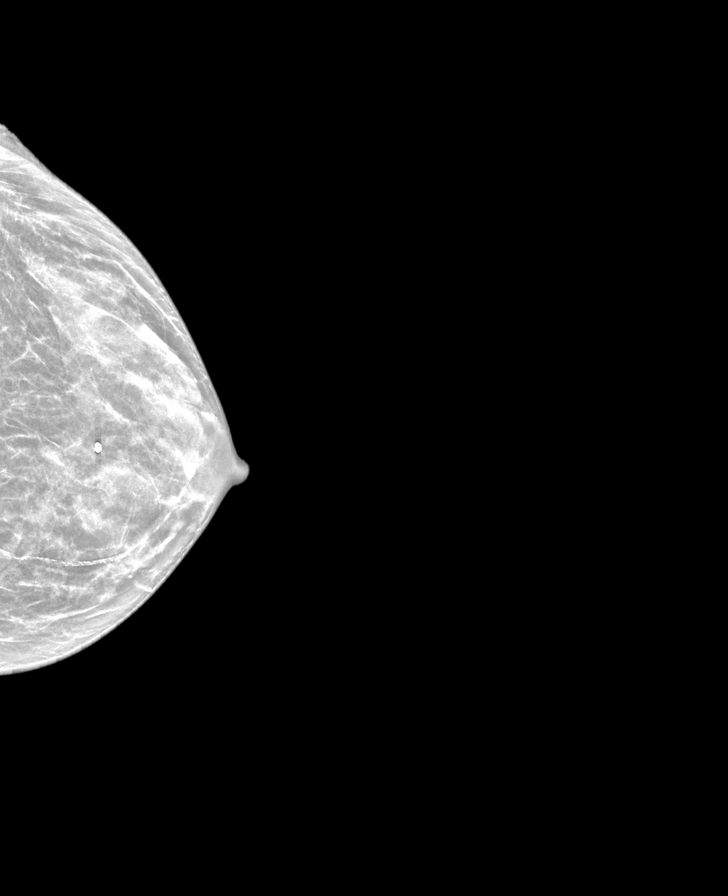

[L MLO synth-2D]
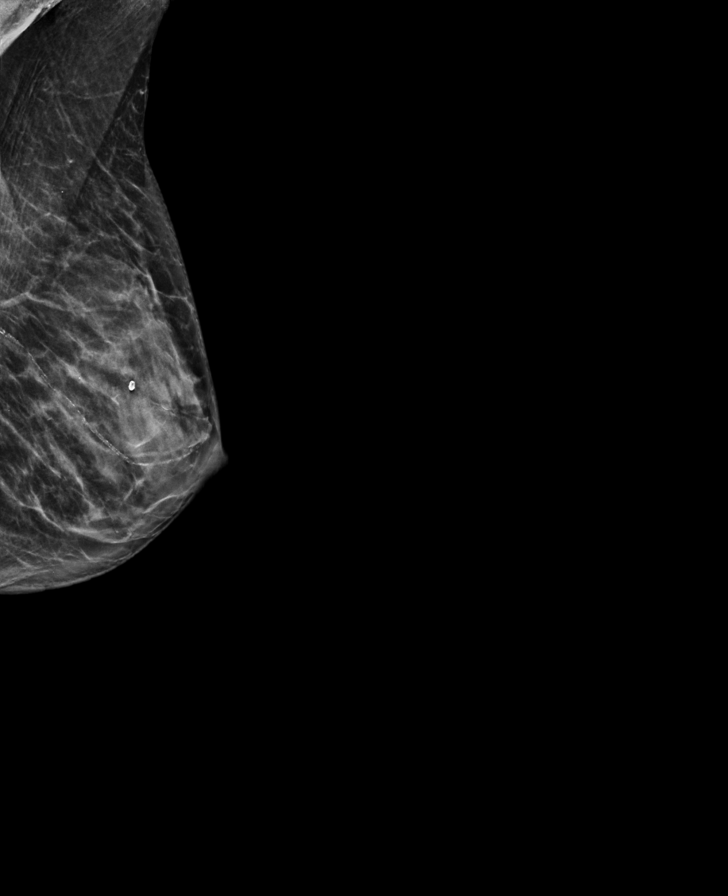

[R CC synth-2D]
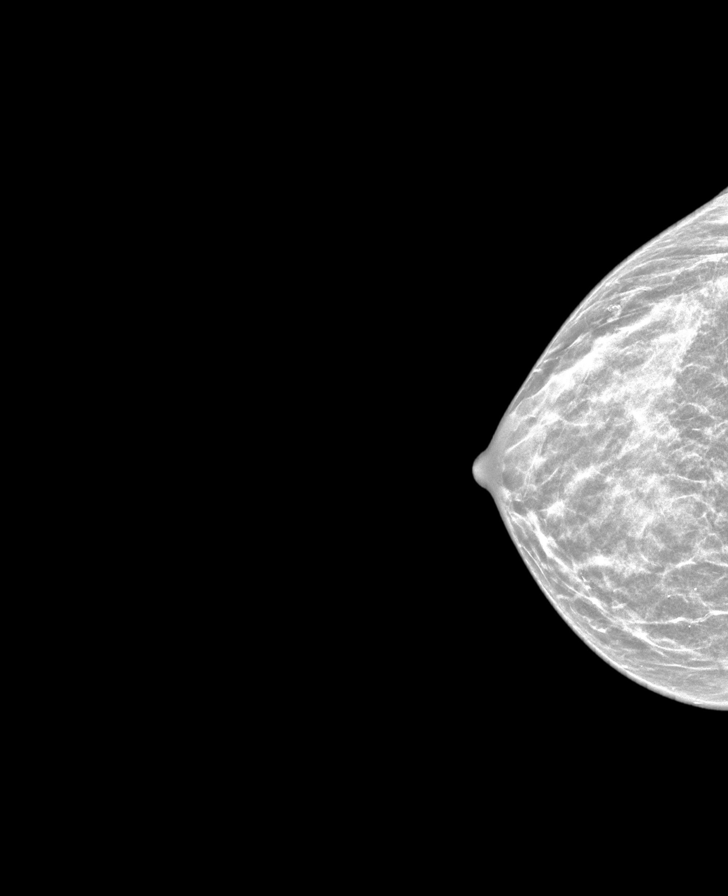

[R MLO synth-2D]
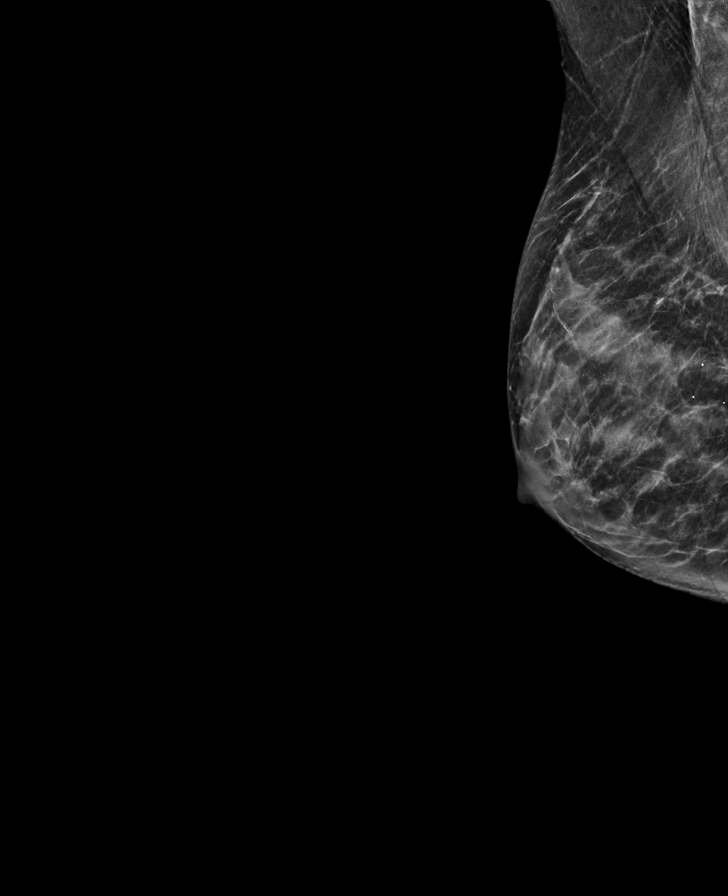

[L MLO tomo · tomo slice 32/63.0]
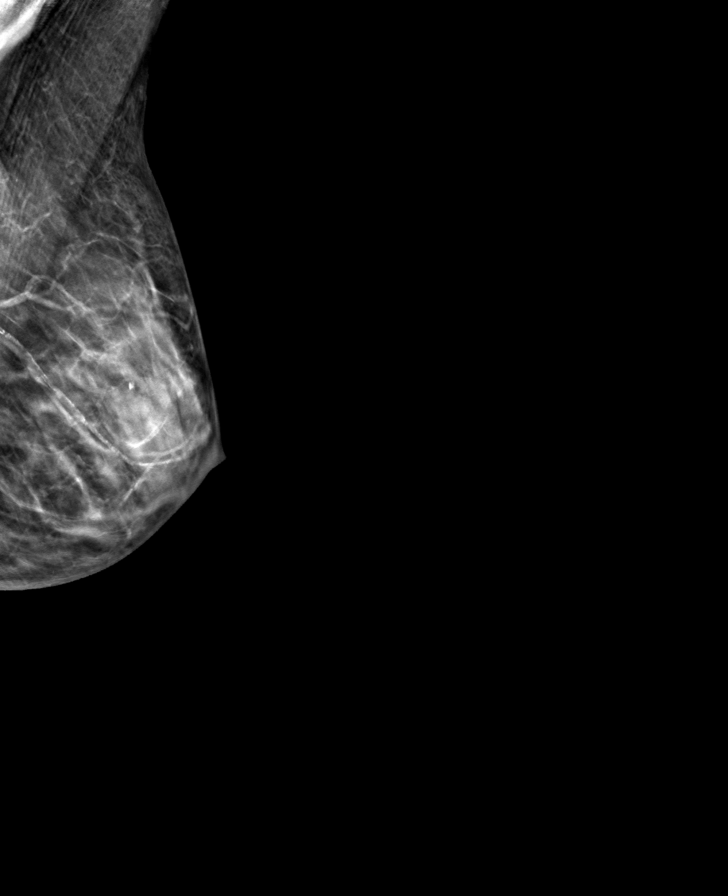

[R CC tomo · tomo slice 21/41.0]
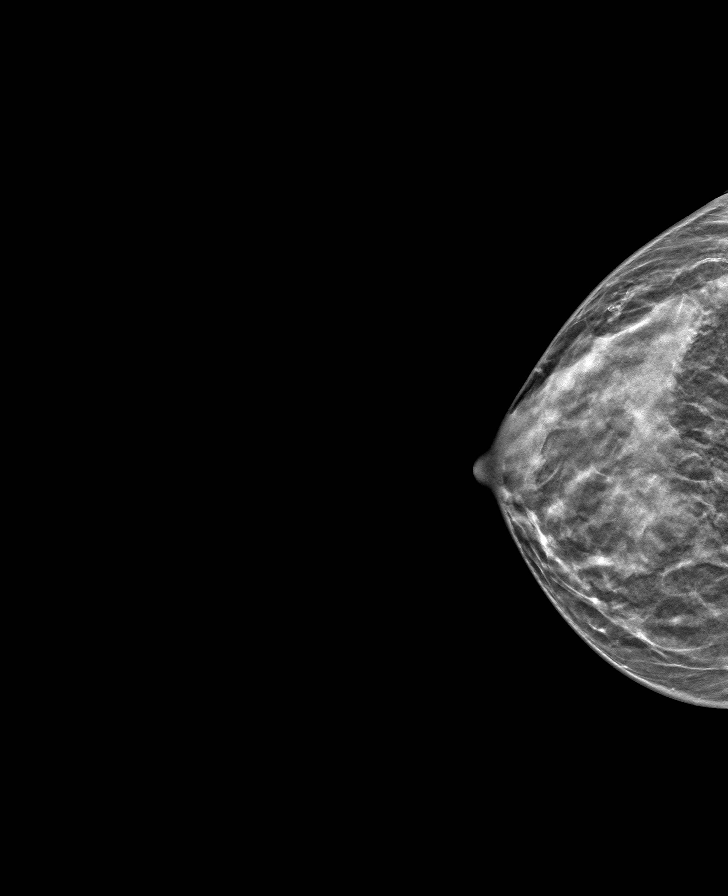

[L CC tomo · tomo slice 29/56.0]
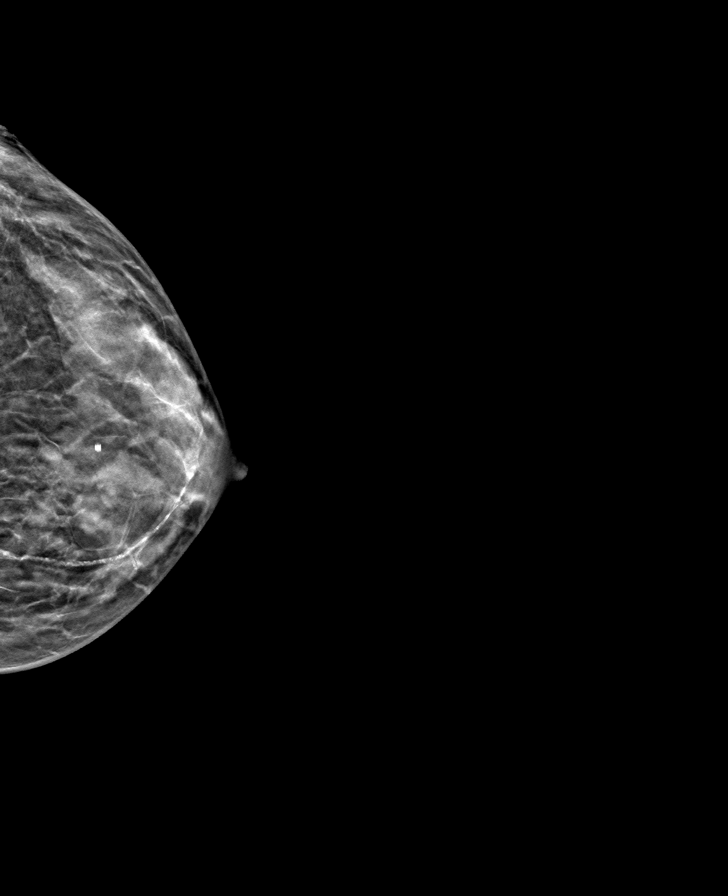

[R MLO tomo · tomo slice 29/58.0]
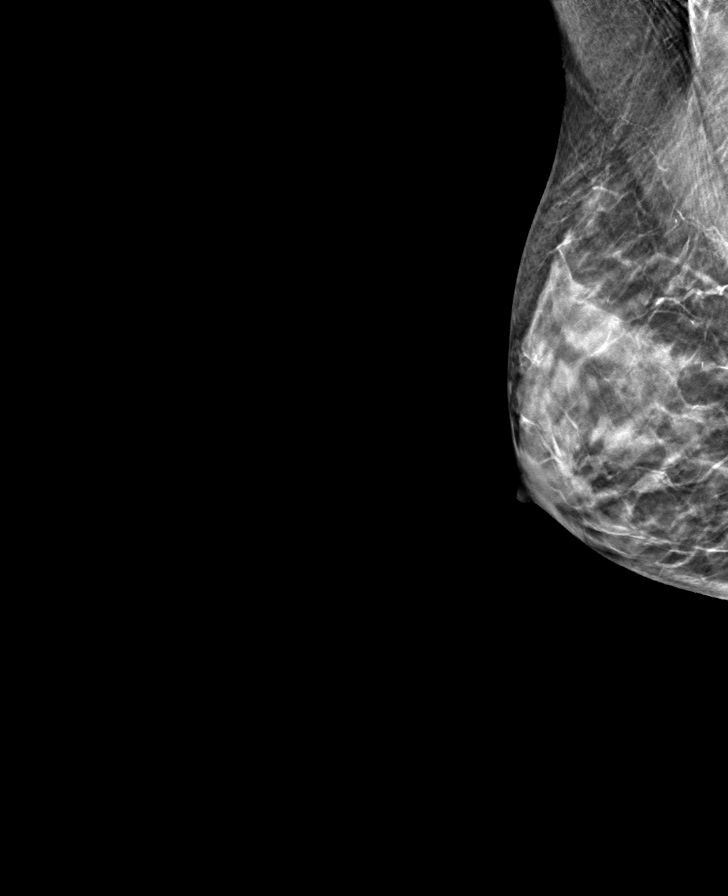

[8 of 24 positions shown; findings below may reference images not displayed]

ACR Breast Density Category c: The breast tissue is heterogeneously
dense, which may obscure small masses.
FINDINGS: There are no findings suspicious for malignancy. The images were
evaluated with computer-aided detection.
IMPRESSION: No mammographic evidence of malignancy. A result letter of this
screening mammogram will be mailed directly to the patient.

RECOMMENDATION:
Screening mammogram in one year. (Code:T4-5-GWO)

BI-RADS CATEGORY  1: Negative.

## 2022-06-23 ENCOUNTER — Telehealth: Payer: Self-pay

## 2022-06-23 DIAGNOSIS — E7800 Pure hypercholesterolemia, unspecified: Secondary | ICD-10-CM

## 2022-06-23 NOTE — Telephone Encounter (Signed)
Pt would like to have labs done prior to her appt on 07/17/2022. I have pended a cmp, lipid and ldl. Is there anything else that she needs?

## 2022-06-29 NOTE — Telephone Encounter (Signed)
noted 

## 2022-06-29 NOTE — Telephone Encounter (Signed)
Pt called in to scheduled lab appt. She's on for 2/28 @7$ :45am.

## 2022-07-08 ENCOUNTER — Other Ambulatory Visit (INDEPENDENT_AMBULATORY_CARE_PROVIDER_SITE_OTHER): Payer: Medicare Other

## 2022-07-08 DIAGNOSIS — E7800 Pure hypercholesterolemia, unspecified: Secondary | ICD-10-CM

## 2022-07-08 LAB — LIPID PANEL
Cholesterol: 179 mg/dL (ref 0–200)
HDL: 86.4 mg/dL (ref >39.00)
LDL Cholesterol: 75 mg/dL (ref 0–99)
NonHDL: 92.72
Total CHOL/HDL Ratio: 2
Triglycerides: 89 mg/dL (ref 0.0–149.0)
VLDL: 17.8 mg/dL (ref 0.0–40.0)

## 2022-07-08 LAB — COMPREHENSIVE METABOLIC PANEL
ALT: 15 U/L (ref 0–35)
AST: 18 U/L (ref 0–37)
Albumin: 4.4 g/dL (ref 3.5–5.2)
Alkaline Phosphatase: 42 U/L (ref 39–117)
BUN: 16 mg/dL (ref 6–23)
CO2: 28 mEq/L (ref 19–32)
Calcium: 9.8 mg/dL (ref 8.4–10.5)
Chloride: 102 mEq/L (ref 96–112)
Creatinine, Ser: 0.76 mg/dL (ref 0.40–1.20)
GFR: 77.72 mL/min (ref >60.00)
Glucose, Bld: 90 mg/dL (ref 70–99)
Potassium: 4.4 mEq/L (ref 3.5–5.1)
Sodium: 140 mEq/L (ref 135–145)
Total Bilirubin: 0.6 mg/dL (ref 0.2–1.2)
Total Protein: 6.7 g/dL (ref 6.0–8.3)

## 2022-07-08 LAB — LDL CHOLESTEROL, DIRECT: Direct LDL: 70 mg/dL

## 2022-07-12 ENCOUNTER — Other Ambulatory Visit: Payer: Self-pay | Admitting: Internal Medicine

## 2022-07-17 ENCOUNTER — Ambulatory Visit (INDEPENDENT_AMBULATORY_CARE_PROVIDER_SITE_OTHER): Payer: Medicare Other | Admitting: Internal Medicine

## 2022-07-17 ENCOUNTER — Encounter: Payer: Self-pay | Admitting: Internal Medicine

## 2022-07-17 VITALS — BP 116/70 | HR 85 | Temp 97.6°F | Resp 14 | Ht 59.0 in | Wt 90.6 lb

## 2022-07-17 DIAGNOSIS — F419 Anxiety disorder, unspecified: Secondary | ICD-10-CM | POA: Diagnosis not present

## 2022-07-17 DIAGNOSIS — Z1231 Encounter for screening mammogram for malignant neoplasm of breast: Secondary | ICD-10-CM

## 2022-07-17 DIAGNOSIS — F411 Generalized anxiety disorder: Secondary | ICD-10-CM | POA: Diagnosis not present

## 2022-07-17 DIAGNOSIS — E559 Vitamin D deficiency, unspecified: Secondary | ICD-10-CM | POA: Diagnosis not present

## 2022-07-17 DIAGNOSIS — R5383 Other fatigue: Secondary | ICD-10-CM

## 2022-07-17 DIAGNOSIS — T452X1S Poisoning by vitamins, accidental (unintentional), sequela: Secondary | ICD-10-CM | POA: Diagnosis not present

## 2022-07-17 DIAGNOSIS — I700 Atherosclerosis of aorta: Secondary | ICD-10-CM | POA: Diagnosis not present

## 2022-07-17 DIAGNOSIS — R911 Solitary pulmonary nodule: Secondary | ICD-10-CM | POA: Diagnosis not present

## 2022-07-17 DIAGNOSIS — M810 Age-related osteoporosis without current pathological fracture: Secondary | ICD-10-CM

## 2022-07-17 DIAGNOSIS — R7301 Impaired fasting glucose: Secondary | ICD-10-CM

## 2022-07-17 DIAGNOSIS — Z716 Tobacco abuse counseling: Secondary | ICD-10-CM | POA: Diagnosis not present

## 2022-07-17 DIAGNOSIS — E7800 Pure hypercholesterolemia, unspecified: Secondary | ICD-10-CM | POA: Diagnosis not present

## 2022-07-17 DIAGNOSIS — E673 Hypervitaminosis D: Secondary | ICD-10-CM | POA: Diagnosis not present

## 2022-07-17 DIAGNOSIS — F5105 Insomnia due to other mental disorder: Secondary | ICD-10-CM

## 2022-07-17 MED ORDER — PAROXETINE HCL 10 MG PO TABS: 10.0000 mg | ORAL_TABLET | Freq: Every day | ORAL | 2 refills | Status: DC

## 2022-07-17 MED ORDER — LORAZEPAM 1 MG PO TABS: 1.0000 mg | ORAL_TABLET | Freq: Every evening | ORAL | 2 refills | Status: DC | PRN

## 2022-07-17 NOTE — Patient Instructions (Addendum)
Good to see you!   Glad you are doing so well!  Prolia injection is due in June  Stay off the vitamin D supplement.   .  We will recheck in September prior to next visit with me

## 2022-07-17 NOTE — Assessment & Plan Note (Signed)
Still smoking 1/2 pack daily.  Reviewed chest CT from November ; recommended quitting

## 2022-07-17 NOTE — Progress Notes (Unsigned)
Subjective:  Patient ID: Kimberly Schmitt, female    DOB: 09/15/1950  Age: 72 y.o. MRN: [id number]  CC: The primary encounter diagnosis was Encounter for screening mammogram for malignant neoplasm of breast. Diagnoses of Pure hypercholesterolemia, Impaired fasting glucose, Other fatigue, Poisoning by vitamin D, accidental or unintentional, sequela, Vitamin D deficiency, and Tobacco abuse counseling were also pertinent to this visit.   HPI Kimberly Schmitt presents for  Chief Complaint  Patient presents with   Medical Management of Chronic Issues   Hyperlipidemia      Outpatient Medications Prior to Visit  Medication Sig Dispense Refill   aspirin 81 MG tablet Take 81 mg by mouth daily.     atorvastatin (LIPITOR) 20 MG tablet TAKE 1 TABLET BY MOUTH EVERY DAY 90 tablet 3   CALCIUM MAGNESIUM ZINC PO Take by mouth.     Docusate Calcium (STOOL SOFTENER PO) Take by mouth.     LACTOBACILLUS PROBIOTIC PO Take 1 capsule by mouth daily.     loratadine (CLARITIN) 10 MG tablet Take 1 tablet (10 mg total) by mouth daily. (Patient taking differently: Take 10 mg by mouth as needed.) 30 tablet 0   LORazepam (ATIVAN) 1 MG tablet Take 1 tablet (1 mg total) by mouth at bedtime. As needed for insomnia and anxiety 30 tablet 5   PARoxetine (PAXIL) 10 MG tablet Take 1 tablet (10 mg total) by mouth daily. after dinner 90 tablet 2   sodium chloride (OCEAN) 0.65 % SOLN nasal spray Place 2 sprays into both nostrils daily as needed for congestion. 30 mL 11   Calcium Carbonate-Vit D-Min (CALCIUM 1200 PO) Take 1 capsule by mouth daily. (Patient not taking: Reported on 02/24/2022)     carbamide peroxide (DEBROX) 6.5 % OTIC solution Place 5 drops into the right ear 2 (two) times daily. 15 mL 0   cholecalciferol (VITAMIN D) 1000 units tablet Take 1,000 Units by mouth daily. (Patient not taking: Reported on 02/24/2022)     fluticasone (FLONASE) 50 MCG/ACT nasal spray Place 2 sprays into both nostrils daily. 16 g 11    No facility-administered medications prior to visit.    Review of Systems;  Patient denies headache, fevers, malaise, unintentional weight loss, skin rash, eye pain, sinus congestion and sinus pain, sore throat, dysphagia,  hemoptysis , cough, dyspnea, wheezing, chest pain, palpitations, orthopnea, edema, abdominal pain, nausea, melena, diarrhea, constipation, flank pain, dysuria, hematuria, urinary  Frequency, nocturia, numbness, tingling, seizures,  Focal weakness, Loss of consciousness,  Tremor, insomnia, depression, anxiety, and suicidal ideation.      Objective:  BP 116/70   Pulse 85   Temp 97.6 F (36.4 C) (Temporal)   Resp 14   Ht '4\' 11"'$  (1.499 m)   Wt 90 lb 9.6 oz (41.1 kg)   SpO2 98%   BMI 18.30 kg/m   BP Readings from Last 3 Encounters:  07/17/22 116/70  02/24/22 120/60  01/16/22 126/80    Wt Readings from Last 3 Encounters:  07/17/22 90 lb 9.6 oz (41.1 kg)  02/24/22 93 lb 6.4 oz (42.4 kg)  01/16/22 90 lb 6.4 oz (41 kg)    Physical Exam  Lab Results  Component Value Date   HGBA1C 6.3 01/16/2022    Lab Results  Component Value Date   CREATININE 0.76 07/08/2022   CREATININE 0.77 01/16/2022   CREATININE 0.60 05/23/2021    Lab Results  Component Value Date   WBC 9.8 01/16/2022   HGB 13.6 01/16/2022   HCT 40.4  01/16/2022   PLT 335.0 01/16/2022   GLUCOSE 90 07/08/2022   CHOL 179 07/08/2022   TRIG 89.0 07/08/2022   HDL 86.40 07/08/2022   LDLDIRECT 70.0 07/08/2022   LDLCALC 75 07/08/2022   ALT 15 07/08/2022   AST 18 07/08/2022   NA 140 07/08/2022   K 4.4 07/08/2022   CL 102 07/08/2022   CREATININE 0.76 07/08/2022   BUN 16 07/08/2022   CO2 28 07/08/2022   TSH 0.93 01/16/2022   INR 1.1 05/09/2021   HGBA1C 6.3 01/16/2022    CT CHEST LUNG CA SCREEN LOW DOSE W/O CM  Result Date: 03/21/2022 CLINICAL DATA:  30 pack-year smoking history/current smoker EXAM: CT CHEST WITHOUT CONTRAST LOW-DOSE FOR LUNG CANCER SCREENING TECHNIQUE: Multidetector CT  imaging of the chest was performed following the standard protocol without IV contrast. RADIATION DOSE REDUCTION: This exam was performed according to the departmental dose-optimization program which includes automated exposure control, adjustment of the mA and/or kV according to patient size and/or use of iterative reconstruction technique. COMPARISON:  05/08/2021 CTA chest. Lung cancer screening CT 02/01/2020 FINDINGS: Cardiovascular: Aortic atherosclerosis. Normal heart size, without pericardial effusion. Left main and left circumflex coronary artery calcification. Mediastinum/Nodes: No mediastinal or hilar adenopathy, given limitations of unenhanced CT. Small hiatal hernia. Lungs/Pleura: No pleural fluid. Moderate centrilobular emphysema. Biapical pleuroparenchymal scarring. Secretions within the trachea. Calcified granulomas. No suspicious pulmonary nodule or mass. Upper Abdomen: Normal imaged portions of the liver, spleen, pancreas, adrenal glands, kidneys. Musculoskeletal: No acute osseous abnormality. IMPRESSION: 1. Lung-RADS 1, negative. Continue annual screening with low-dose chest CT without contrast in 12 months. 2. Small hiatal hernia. 3. Aortic Atherosclerosis (ICD10-I70.0) and Emphysema (ICD10-J43.9). Coronary artery atherosclerosis. Electronically Signed   By: Abigail Miyamoto M.D.   On: 03/21/2022 14:08   Assessment & Plan:  .Encounter for screening mammogram for malignant neoplasm of breast  Pure hypercholesterolemia  Impaired fasting glucose  Other fatigue  Poisoning by vitamin D, accidental or unintentional, sequela  Vitamin D deficiency  Tobacco abuse counseling Assessment & Plan: Still smoking 1/2 pack daily.  Reviewed chest CT from November ; recommended quitting        I provided 30 minutes of face-to-face time during this encounter reviewing patient's last visit with me, patient's  most recent visit with cardiology,  nephrology,  and neurology,  recent surgical and non  surgical procedures, previous  labs and imaging studies, counseling on currently addressed issues,  and post visit ordering to diagnostics and therapeutics .   Follow-up: No follow-ups on file.   Crecencio Mc, MD

## 2022-07-18 DIAGNOSIS — E673 Hypervitaminosis D: Secondary | ICD-10-CM | POA: Insufficient documentation

## 2022-07-18 NOTE — Assessment & Plan Note (Signed)
Controlled with SSRI.    continue paxil daily and lorazepam for prn use to manage insomnia

## 2022-07-18 NOTE — Assessment & Plan Note (Signed)
.   She has not used  the lorazepam more than twice weekly . The risks and benefits of benzodiazepine use were discussed with patient today including excessive sedation leading to respiratory depression,  impaired thinking/driving, and addiction.  Patient was advised to avoid concurrent use with alcohol, to use medication only as needed and not to share with others  .   

## 2022-07-18 NOTE — Assessment & Plan Note (Signed)
Continue  use Prolia., next dose due in June 2024  Discussed the current controversies surrounding the risks and benefits of calcium supplementation.  Encouraged her to increase dietary calcium through natural foods including almond/coconut milk

## 2022-07-18 NOTE — Assessment & Plan Note (Signed)
Reviewed findings of prior CT scan today..  Patient is tolerating high potency statin therapy and LDL is now around goal of  70.   Lab Results  Component Value Date   CHOL 179 07/08/2022   HDL 86.40 07/08/2022   LDLCALC 75 07/08/2022   LDLDIRECT 70.0 07/08/2022   TRIG 89.0 07/08/2022   CHOLHDL 2 07/08/2022

## 2022-07-18 NOTE — Assessment & Plan Note (Signed)
Resolving with cessation of supplement

## 2022-07-18 NOTE — Assessment & Plan Note (Signed)
No change by serial CTs . She has been referred to pulmonary for follow up and is undergoing routine surveillance

## 2022-07-29 DIAGNOSIS — D2262 Melanocytic nevi of left upper limb, including shoulder: Secondary | ICD-10-CM | POA: Diagnosis not present

## 2022-07-29 DIAGNOSIS — L821 Other seborrheic keratosis: Secondary | ICD-10-CM | POA: Diagnosis not present

## 2022-07-29 DIAGNOSIS — D2261 Melanocytic nevi of right upper limb, including shoulder: Secondary | ICD-10-CM | POA: Diagnosis not present

## 2022-07-29 DIAGNOSIS — D485 Neoplasm of uncertain behavior of skin: Secondary | ICD-10-CM | POA: Diagnosis not present

## 2022-07-29 DIAGNOSIS — D225 Melanocytic nevi of trunk: Secondary | ICD-10-CM | POA: Diagnosis not present

## 2022-07-29 DIAGNOSIS — D2272 Melanocytic nevi of left lower limb, including hip: Secondary | ICD-10-CM | POA: Diagnosis not present

## 2022-07-29 DIAGNOSIS — C44321 Squamous cell carcinoma of skin of nose: Secondary | ICD-10-CM | POA: Diagnosis not present

## 2022-07-29 DIAGNOSIS — D2271 Melanocytic nevi of right lower limb, including hip: Secondary | ICD-10-CM | POA: Diagnosis not present

## 2022-08-11 ENCOUNTER — Ambulatory Visit
Admission: RE | Admit: 2022-08-11 | Discharge: 2022-08-11 | Disposition: A | Discharge status: Home / Self Care | Payer: Medicare Other | Source: Ambulatory Visit | Attending: Internal Medicine | Admitting: Internal Medicine

## 2022-08-11 DIAGNOSIS — Z1231 Encounter for screening mammogram for malignant neoplasm of breast: Secondary | ICD-10-CM | POA: Diagnosis not present

## 2022-08-13 ENCOUNTER — Other Ambulatory Visit: Payer: Self-pay | Admitting: Internal Medicine

## 2022-08-13 DIAGNOSIS — N6489 Other specified disorders of breast: Secondary | ICD-10-CM

## 2022-08-13 DIAGNOSIS — R928 Other abnormal and inconclusive findings on diagnostic imaging of breast: Secondary | ICD-10-CM

## 2022-08-17 ENCOUNTER — Telehealth: Payer: Self-pay

## 2022-08-17 NOTE — Telephone Encounter (Signed)
-----   Message from Sherlene Shams, MD sent at 08/14/2022 12:35 PM EDT ----- [facility] has reported that your  mammogram was asymmetric  on  the right.   The radiologist is recommending additional images  of the right breast and the facility will contact you directly to get those scheduled.  Please do not panic!  This happens quite frequently and more often in women with dense breasts., and DOES NOT mean you have Cancer!   The tone of the message you received from the facility is unfortunately meant to ensure that you return for the additional images.  If you do not hear from them by the end of  NEXT  week, please call my office  and I will get involved .   Regards,  Dr. Darrick Huntsman

## 2022-08-17 NOTE — Telephone Encounter (Signed)
Pt called back and I read the message to her and she stated she has a diagnostic and ultrasound appointment on 4/10 with them

## 2022-08-17 NOTE — Telephone Encounter (Signed)
LMTCB in regards to mammogram results.  ?

## 2022-08-19 ENCOUNTER — Ambulatory Visit
Admission: RE | Admit: 2022-08-19 | Discharge: 2022-08-19 | Disposition: A | Discharge status: Home / Self Care | Payer: Medicare Other | Source: Ambulatory Visit | Attending: Internal Medicine | Admitting: Internal Medicine

## 2022-08-19 DIAGNOSIS — R928 Other abnormal and inconclusive findings on diagnostic imaging of breast: Secondary | ICD-10-CM | POA: Diagnosis not present

## 2022-08-19 DIAGNOSIS — N6489 Other specified disorders of breast: Secondary | ICD-10-CM

## 2022-09-01 ENCOUNTER — Encounter: Payer: Self-pay | Admitting: Student in an Organized Health Care Education/Training Program

## 2022-09-01 ENCOUNTER — Ambulatory Visit (INDEPENDENT_AMBULATORY_CARE_PROVIDER_SITE_OTHER): Payer: Medicare Other | Admitting: Student in an Organized Health Care Education/Training Program

## 2022-09-01 VITALS — BP 128/70 | HR 100 | Temp 98.2°F | Ht 59.5 in | Wt 90.4 lb

## 2022-09-01 DIAGNOSIS — R918 Other nonspecific abnormal finding of lung field: Secondary | ICD-10-CM

## 2022-09-01 DIAGNOSIS — Z720 Tobacco use: Secondary | ICD-10-CM

## 2022-09-01 NOTE — Progress Notes (Signed)
Assessment & Plan:   #Pulmonary Nodules:   CT scan in 04/2021 was in the setting of acute COVID-19 infection for which she was hospitalized. The CT was notable for multiple ground glass and tree in bud nodular opacities consistent with viral pneumonia. Patient does have a history of smoking and will benefit from being re-enrolled in lung cancer screening. Repeat LDCT in November of 2023 was within normal, next CT in November of 2024.   - Continue with Lung Cancer Screening   #Tobacco abuse   Long standing history of smoking with prior successful cessation attempt, albeit relapsed. Counseled patient at length today and she is willing to quit. I had previously prescribed lozenges and patches. She is willing to attempt to quit again.  Return if symptoms worsen or fail to improve.  I spent 25 minutes caring for this patient today, including preparing to see the patient, obtaining a medical history , reviewing a separately obtained history, performing a medically appropriate examination and/or evaluation, counseling and educating the patient/family/caregiver, ordering medications, tests, or procedures, documenting clinical information in the electronic health record, and independently interpreting results (not separately reported/billed) and communicating results to the patient/family/caregiver  Raechel Chute, MD [facility] 09/01/2022 4:52 PM    End of visit medications:  No orders of the defined types were placed in this encounter.    Current Outpatient Medications:    aspirin 81 MG tablet, Take 81 mg by mouth daily., Disp: , Rfl:    atorvastatin (LIPITOR) 20 MG tablet, TAKE 1 TABLET BY MOUTH EVERY DAY, Disp: 90 tablet, Rfl: 3   CALCIUM MAGNESIUM ZINC PO, Take by mouth., Disp: , Rfl:    Docusate Calcium (STOOL SOFTENER PO), Take by mouth., Disp: , Rfl:    LACTOBACILLUS PROBIOTIC PO, Take 1 capsule by mouth daily., Disp: , Rfl:    loratadine (CLARITIN) 10 MG  tablet, Take 1 tablet (10 mg total) by mouth daily. (Patient taking differently: Take 10 mg by mouth as needed.), Disp: 30 tablet, Rfl: 0   LORazepam (ATIVAN) 1 MG tablet, Take 1 tablet (1 mg total) by mouth at bedtime as needed for sleep., Disp: 30 tablet, Rfl: 2   PARoxetine (PAXIL) 10 MG tablet, Take 1 tablet (10 mg total) by mouth daily. after dinner, Disp: 90 tablet, Rfl: 2   sodium chloride (OCEAN) 0.65 % SOLN nasal spray, Place 2 sprays into both nostrils daily as needed for congestion., Disp: 30 mL, Rfl: 11   Subjective:   PATIENT ID: Kimberly Schmitt GENDER: female DOB: 11/07/1950, MRN: [id number]  Chief Complaint  Patient presents with   Follow-up    Dry cough at times prod with white sputum.     HPI  Patient is a pleasant 72 year old female presenting to clinic for follow up. I had previously seen her for pulmonary nodules noted on CT.   She tells me that she has no symptoms and is overall feeling great. She does not have any shortness of breath at rest or with exertion.  She does not have any cough, chest tightness, or wheezing.  She is able to do all the activities of her daily living and takes care of her lawn without any shortness of breath.    She was admitted to the hospital in December 2022 for shortness of breath and was diagnosed with COVID.  At the time of the admission, a CT scan of the chest was notable for multiple groundglass and tree-in-bud opacities.  Prior to that,  she had been getting yearly low-dose CT scans of the chest for lung cancer screening but has not had any repeat imaging since December.  She was referred for the evaluation of said nodules.  Since her last visit, she was re-enrolled in our lung cancer screening program and underwent a CT 03/2022 that was within normal, with the next CT to be performed in November of 2024.   She is a smoker, having smoked around a pack a day for 54 years.  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  She does not have any pets at the current moment.  Ancillary information including prior medications, full medical/surgical/family/social histories, and PFTs (when available) are listed below and have been reviewed.   Review of Systems  Constitutional:  Negative for chills, fever and weight loss.  Respiratory:  Negative for cough, hemoptysis, sputum production, shortness of breath and wheezing.   Cardiovascular:  Negative for chest pain, palpitations and leg swelling.  Skin:  Negative for rash.     Objective:   Vitals:   09/01/22 1548  BP: 128/70  Pulse: 100  Temp: 98.2 F (36.8 C)  TempSrc: Temporal  SpO2: 96%  Weight: 90 lb 6.4 oz (41 kg)  Height: 4' 11.5" (1.511 m)   96% on RA BMI Readings from Last 3 Encounters:  09/01/22 17.95 kg/m  07/17/22 18.30 kg/m  02/24/22 18.55 kg/m   Wt Readings from Last 3 Encounters:  09/01/22 90 lb 6.4 oz (41 kg)  07/17/22 90 lb 9.6 oz (41.1 kg)  02/24/22 93 lb 6.4 oz (42.4 kg)    Physical Exam Constitutional:      Appearance: Normal appearance. She is normal weight.  HENT:     Head: Normocephalic.     Nose: Nose normal.     Mouth/Throat:     Mouth: Mucous membranes are dry.  Eyes:     Extraocular Movements: Extraocular movements intact.  Cardiovascular:     Rate and Rhythm: Normal rate and regular rhythm.     Pulses: Normal pulses.     Heart sounds: Normal heart sounds.  Pulmonary:     Effort: Pulmonary effort is normal.     Breath sounds: Normal breath sounds.  Abdominal:     General: Abdomen is flat.     Palpations: Abdomen is soft.  Musculoskeletal:     Cervical back: Normal range of motion and neck supple.  Skin:    General: Skin is warm.  Neurological:     General: No focal deficit present.     Mental Status: She is alert and oriented to person, place, and time. Mental status is at baseline.       Ancillary Information    Past Medical History:  Diagnosis Date   CAP (community  acquired pneumonia) 05/08/2021   Family history of breast cancer    Family history of colon cancer    Family history of pancreatic cancer    Hyperlipidemia    220 per patient   Personal history of tobacco use, presenting hazards to health 06/12/2015   Pneumonia due to COVID-19 virus 05/08/2021   Sepsis 05/08/2021   Visit for preventive health examination 04/17/2013   .      Family History  Problem Relation Age of Onset   Cancer Brother 70       pancreatic   Breast cancer Maternal Grandmother 45   Cancer Father 79       colon   Diabetes  Sister    Stroke Maternal Aunt    Cancer Paternal Uncle        unk type   Colon polyps Brother      Past Surgical History:  Procedure Laterality Date   APPENDECTOMY  1968   COLONOSCOPY     COLONOSCOPY     COLONOSCOPY WITH PROPOFOL N/A 09/20/2020   Procedure: COLONOSCOPY WITH PROPOFOL;  Surgeon: Earline Mayotte, MD;  Location: [facility] ENDOSCOPY;  Service: Endoscopy;  Laterality: N/A;    Social History   Socioeconomic History   Marital status: Married    Spouse name: Not on file   Number of children: Not on file   Years of education: Not on file   Highest education level: Not on file  Occupational History   Not on file  Tobacco Use   Smoking status: Every Day    Packs/day: 1.00    Years: 54.00    Additional pack years: 0.00    Total pack years: 54.00    Types: Cigarettes   Smokeless tobacco: Never   Tobacco comments:    0.5PPD 09/01/2022  Substance and Sexual Activity   Alcohol use: No    Alcohol/week: 0.0 standard drinks of alcohol   Drug use: No   Sexual activity: Not Currently  Other Topics Concern   Not on file  Social History Narrative   Not on file   Social Determinants of Health   Financial Resource Strain: Low Risk  (12/03/2021)   Overall Financial Resource Strain (CARDIA)    Difficulty of Paying Living Expenses: Not hard at all  Food Insecurity: No Food Insecurity (12/03/2021)   Hunger Vital Sign    Worried  About Running Out of Food in the Last Year: Never true    Ran Out of Food in the Last Year: Never true  Transportation Needs: No Transportation Needs (12/03/2021)   PRAPARE - Administrator, Civil Service (Medical): No    Lack of Transportation (Non-Medical): No  Physical Activity: Sufficiently Active (12/02/2020)   Exercise Vital Sign    Days of Exercise per Week: 5 days    Minutes of Exercise per Session: 30 min  Stress: No Stress Concern Present (12/03/2021)   [employer] of Occupational Health - Occupational Stress Questionnaire    Feeling of Stress : Not at all  Social Connections: Unknown (12/03/2021)   Social Connection and Isolation Panel [NHANES]    Frequency of Communication with Friends and Family: More than three times a week    Frequency of Social Gatherings with Friends and Family: More than three times a week    Attends Religious Services: Not on file    Active Member of Clubs or Organizations: Not on file    Attends Banker Meetings: Not on file    Marital Status: Widowed  Intimate Partner Violence: Not At Risk (12/03/2021)   Humiliation, Afraid, Rape, and Kick questionnaire    Fear of Current or Ex-Partner: No    Emotionally Abused: No    Physically Abused: No    Sexually Abused: No     No Known Allergies   CBC    Component Value Date/Time   WBC 9.8 01/16/2022 0926   RBC 4.34 01/16/2022 0926   HGB 13.6 01/16/2022 0926   HCT 40.4 01/16/2022 0926   PLT 335.0 01/16/2022 0926   MCV 93.1 01/16/2022 0926   MCH 30.8 05/23/2021 1631   MCHC 33.6 01/16/2022 0926   RDW 15.1 01/16/2022 0926   LYMPHSABS 1.7  01/16/2022 0926   MONOABS 0.6 01/16/2022 0926   EOSABS 0.1 01/16/2022 0926   BASOSABS 0.1 01/16/2022 1914    Pulmonary Functions Testing Results:     No data to display          Outpatient Medications Prior to Visit  Medication Sig Dispense Refill   aspirin 81 MG tablet Take 81 mg by mouth daily.     atorvastatin (LIPITOR) 20  MG tablet TAKE 1 TABLET BY MOUTH EVERY DAY 90 tablet 3   CALCIUM MAGNESIUM ZINC PO Take by mouth.     Docusate Calcium (STOOL SOFTENER PO) Take by mouth.     LACTOBACILLUS PROBIOTIC PO Take 1 capsule by mouth daily.     loratadine (CLARITIN) 10 MG tablet Take 1 tablet (10 mg total) by mouth daily. (Patient taking differently: Take 10 mg by mouth as needed.) 30 tablet 0   LORazepam (ATIVAN) 1 MG tablet Take 1 tablet (1 mg total) by mouth at bedtime as needed for sleep. 30 tablet 2   PARoxetine (PAXIL) 10 MG tablet Take 1 tablet (10 mg total) by mouth daily. after dinner 90 tablet 2   sodium chloride (OCEAN) 0.65 % SOLN nasal spray Place 2 sprays into both nostrils daily as needed for congestion. 30 mL 11   No facility-administered medications prior to visit.

## 2022-09-10 DIAGNOSIS — H2512 Age-related nuclear cataract, left eye: Secondary | ICD-10-CM | POA: Diagnosis not present

## 2022-09-10 DIAGNOSIS — H35363 Drusen (degenerative) of macula, bilateral: Secondary | ICD-10-CM | POA: Diagnosis not present

## 2022-09-10 DIAGNOSIS — H43813 Vitreous degeneration, bilateral: Secondary | ICD-10-CM | POA: Diagnosis not present

## 2022-09-10 DIAGNOSIS — H2511 Age-related nuclear cataract, right eye: Secondary | ICD-10-CM | POA: Diagnosis not present

## 2022-09-22 DIAGNOSIS — L578 Other skin changes due to chronic exposure to nonionizing radiation: Secondary | ICD-10-CM | POA: Diagnosis not present

## 2022-09-22 DIAGNOSIS — L814 Other melanin hyperpigmentation: Secondary | ICD-10-CM | POA: Diagnosis not present

## 2022-09-22 DIAGNOSIS — C44321 Squamous cell carcinoma of skin of nose: Secondary | ICD-10-CM | POA: Diagnosis not present

## 2022-09-22 DIAGNOSIS — L988 Other specified disorders of the skin and subcutaneous tissue: Secondary | ICD-10-CM | POA: Diagnosis not present

## 2022-10-13 ENCOUNTER — Ambulatory Visit (INDEPENDENT_AMBULATORY_CARE_PROVIDER_SITE_OTHER): Payer: Medicare Other

## 2022-10-13 DIAGNOSIS — M810 Age-related osteoporosis without current pathological fracture: Secondary | ICD-10-CM

## 2022-10-13 MED ORDER — DENOSUMAB 60 MG/ML ~~LOC~~ SOSY
60.0000 mg | PREFILLED_SYRINGE | Freq: Once | SUBCUTANEOUS | Status: AC
Start: 2022-10-13 — End: 2022-10-13
  Administered 2022-10-13: 60 mg via SUBCUTANEOUS

## 2022-10-13 NOTE — Progress Notes (Signed)
Patient presented for Prolia injection to right arm, patient voiced no concerns nor showed any signs of distress during injection  

## 2022-12-15 ENCOUNTER — Encounter: Payer: Self-pay | Admitting: Emergency Medicine

## 2022-12-16 ENCOUNTER — Ambulatory Visit (INDEPENDENT_AMBULATORY_CARE_PROVIDER_SITE_OTHER): Payer: Medicare Other | Admitting: Emergency Medicine

## 2022-12-16 VITALS — Ht 59.5 in | Wt 90.5 lb

## 2022-12-16 DIAGNOSIS — F17210 Nicotine dependence, cigarettes, uncomplicated: Secondary | ICD-10-CM | POA: Diagnosis not present

## 2022-12-16 DIAGNOSIS — M810 Age-related osteoporosis without current pathological fracture: Secondary | ICD-10-CM

## 2022-12-16 DIAGNOSIS — Z780 Asymptomatic menopausal state: Secondary | ICD-10-CM | POA: Diagnosis not present

## 2022-12-16 DIAGNOSIS — Z0000 Encounter for general adult medical examination without abnormal findings: Secondary | ICD-10-CM | POA: Diagnosis not present

## 2022-12-16 NOTE — Patient Instructions (Addendum)
Kimberly Schmitt , Thank you for taking time to come for your Medicare Wellness Visit. I appreciate your ongoing commitment to your health goals. Please review the following plan we discussed and let me know if I can assist you in the future.   Referrals/Orders/Follow-Ups/Clinician Recommendations: I have placed orders for a bone density test and a low dose lung CT Scan. Get your flu shot this fall.  This is a list of the screening recommended for you and due dates:  Health Maintenance  Topic Date Due   DEXA scan (bone density measurement)  07/25/2021   Flu Shot  12/10/2022   Screening for Lung Cancer  03/19/2023   Mammogram  08/11/2023   Medicare Annual Wellness Visit  12/16/2023   Colon Cancer Screening  09/20/2025   DTaP/Tdap/Td vaccine (3 - Td or Tdap) 02/22/2031   Pneumonia Vaccine  Completed   Hepatitis C Screening  Completed   Zoster (Shingles) Vaccine  Completed   HPV Vaccine  Aged Out   COVID-19 Vaccine  Discontinued    Advanced directives: (Copy Requested) Please bring a copy of your health care power of attorney and living will to the office to be added to your chart at your convenience.  Next Medicare Annual Wellness Visit scheduled for next year: Yes, 12/22/23 @ 9am  Preventive Care 65 Years and Older, Female Preventive care refers to lifestyle choices and visits with your health care provider that can promote health and wellness. What does preventive care include? A yearly physical exam. This is also called an annual well check. Dental exams once or twice a year. Routine eye exams. Ask your health care provider how often you should have your eyes checked. Personal lifestyle choices, including: Daily care of your teeth and gums. Regular physical activity. Eating a healthy diet. Avoiding tobacco and drug use. Limiting alcohol use. Practicing safe sex. Taking low-dose aspirin every day. Taking vitamin and mineral supplements as recommended by your health care  provider. What happens during an annual well check? The services and screenings done by your health care provider during your annual well check will depend on your age, overall health, lifestyle risk factors, and family history of disease. Counseling  Your health care provider may ask you questions about your: Alcohol use. Tobacco use. Drug use. Emotional well-being. Home and relationship well-being. Sexual activity. Eating habits. History of falls. Memory and ability to understand (cognition). Work and work Astronomer. Reproductive health. Screening  You may have the following tests or measurements: Height, weight, and BMI. Blood pressure. Lipid and cholesterol levels. These may be checked every 5 years, or more frequently if you are over 71 years old. Skin check. Lung cancer screening. You may have this screening every year starting at age 59 if you have a 30-pack-year history of smoking and currently smoke or have quit within the past 15 years. Fecal occult blood test (FOBT) of the stool. You may have this test every year starting at age 69. Flexible sigmoidoscopy or colonoscopy. You may have a sigmoidoscopy every 5 years or a colonoscopy every 10 years starting at age 60. Hepatitis C blood test. Hepatitis B blood test. Sexually transmitted disease (STD) testing. Diabetes screening. This is done by checking your blood sugar (glucose) after you have not eaten for a while (fasting). You may have this done every 1-3 years. Bone density scan. This is done to screen for osteoporosis. You may have this done starting at age 61. Mammogram. This may be done every 1-2 years. Talk to  your health care provider about how often you should have regular mammograms. Talk with your health care provider about your test results, treatment options, and if necessary, the need for more tests. Vaccines  Your health care provider may recommend certain vaccines, such as: Influenza vaccine. This is  recommended every year. Tetanus, diphtheria, and acellular pertussis (Tdap, Td) vaccine. You may need a Td booster every 10 years. Zoster vaccine. You may need this after age 67. Pneumococcal 13-valent conjugate (PCV13) vaccine. One dose is recommended after age 29. Pneumococcal polysaccharide (PPSV23) vaccine. One dose is recommended after age 71. Talk to your health care provider about which screenings and vaccines you need and how often you need them. This information is not intended to replace advice given to you by your health care provider. Make sure you discuss any questions you have with your health care provider. Document Released: 05/24/2015 Document Revised: 01/15/2016 Document Reviewed: 02/26/2015 Elsevier Interactive Patient Education  2017 [employer].  Fall Prevention in the Home Falls can cause injuries. They can happen to people of all ages. There are many things you can do to make your home safe and to help prevent falls. What can I do on the outside of my home? Regularly fix the edges of walkways and driveways and fix any cracks. Remove anything that might make you trip as you walk through a door, such as a raised step or threshold. Trim any bushes or trees on the path to your home. Use bright outdoor lighting. Clear any walking paths of anything that might make someone trip, such as rocks or tools. Regularly check to see if handrails are loose or broken. Make sure that both sides of any steps have handrails. Any raised decks and porches should have guardrails on the edges. Have any leaves, snow, or ice cleared regularly. Use sand or salt on walking paths during winter. Clean up any spills in your garage right away. This includes oil or grease spills. What can I do in the bathroom? Use night lights. Install grab bars by the toilet and in the tub and shower. Do not use towel bars as grab bars. Use non-skid mats or decals in the tub or shower. If you need to sit down in  the shower, use a plastic, non-slip stool. Keep the floor dry. Clean up any water that spills on the floor as soon as it happens. Remove soap buildup in the tub or shower regularly. Attach bath mats securely with double-sided non-slip rug tape. Do not have throw rugs and other things on the floor that can make you trip. What can I do in the bedroom? Use night lights. Make sure that you have a light by your bed that is easy to reach. Do not use any sheets or blankets that are too big for your bed. They should not hang down onto the floor. Have a firm chair that has side arms. You can use this for support while you get dressed. Do not have throw rugs and other things on the floor that can make you trip. What can I do in the kitchen? Clean up any spills right away. Avoid walking on wet floors. Keep items that you use a lot in easy-to-reach places. If you need to reach something above you, use a strong step stool that has a grab bar. Keep electrical cords out of the way. Do not use floor polish or wax that makes floors slippery. If you must use wax, use non-skid floor wax. Do not  have throw rugs and other things on the floor that can make you trip. What can I do with my stairs? Do not leave any items on the stairs. Make sure that there are handrails on both sides of the stairs and use them. Fix handrails that are broken or loose. Make sure that handrails are as long as the stairways. Check any carpeting to make sure that it is firmly attached to the stairs. Fix any carpet that is loose or worn. Avoid having throw rugs at the top or bottom of the stairs. If you do have throw rugs, attach them to the floor with carpet tape. Make sure that you have a light switch at the top of the stairs and the bottom of the stairs. If you do not have them, ask someone to add them for you. What else can I do to help prevent falls? Wear shoes that: Do not have high heels. Have rubber bottoms. Are comfortable  and fit you well. Are closed at the toe. Do not wear sandals. If you use a stepladder: Make sure that it is fully opened. Do not climb a closed stepladder. Make sure that both sides of the stepladder are locked into place. Ask someone to hold it for you, if possible. Clearly mark and make sure that you can see: Any grab bars or handrails. First and last steps. Where the edge of each step is. Use tools that help you move around (mobility aids) if they are needed. These include: Canes. Walkers. Scooters. Crutches. Turn on the lights when you go into a dark area. Replace any light bulbs as soon as they burn out. Set up your furniture so you have a clear path. Avoid moving your furniture around. If any of your floors are uneven, fix them. If there are any pets around you, be aware of where they are. Review your medicines with your doctor. Some medicines can make you feel dizzy. This can increase your chance of falling. Ask your doctor what other things that you can do to help prevent falls. This information is not intended to replace advice given to you by your health care provider. Make sure you discuss any questions you have with your health care provider. Document Released: 02/21/2009 Document Revised: 10/03/2015 Document Reviewed: 06/01/2014 Elsevier Interactive Patient Education  2017 [employer].

## 2022-12-16 NOTE — Progress Notes (Addendum)
Subjective:   Kimberly Schmitt is a 72 y.o. female who presents for Medicare Annual (Subsequent) preventive examination.  Visit Complete: Virtual  I connected with  [name] on 12/16/22 by a audio enabled telemedicine application and verified that I am speaking with the correct person using two identifiers.  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 The patient expressed understanding and agreed to proceed.  Vital Signs: Unable to obtain new vitals due to this being a telehealth visit.   Review of Systems     Cardiac Risk Factors include: advanced age (>74men, >9 women);dyslipidemia;smoking/ tobacco exposure;Other (see comment), Risk factor comments: Atherosclerosis of native coronary artery, Aortic atherosclerosis     Objective:    Today's Vitals   12/16/22 0901  Weight: 90 lb 8 oz (41.1 kg)  Height: 4' 11.5" (1.511 m)   Body mass index is 17.97 kg/m.     12/16/2022    9:20 AM 12/03/2021    8:28 AM 02/21/2021    9:45 AM 12/02/2020    9:15 AM 09/20/2020    7:41 AM 01/13/2020    9:01 PM 11/30/2019    9:16 AM  Advanced Directives  Does Patient Have a Medical Advance Directive? Yes Yes No Yes Yes Yes No  Type of Estate agent of [place];Living will Healthcare Power of [place];Living will  Healthcare Power of [place];Living will Living will Healthcare Power of [place];Living will   Does patient want to make changes to medical advance directive? No - Patient declined No - Patient declined  No - Patient declined     Copy of Healthcare Power of Attorney in Chart? No - copy requested No - copy requested  No - copy requested     Would patient like information on creating a medical advance directive?   No - Patient declined    No - Patient declined    Current Medications (verified) Outpatient Encounter Medications as of 12/16/2022  Medication Sig   aspirin  81 MG tablet Take 81 mg by mouth daily.   atorvastatin (LIPITOR) 20 MG tablet TAKE 1 TABLET BY MOUTH EVERY DAY   CALCIUM MAGNESIUM ZINC PO Take by mouth.   Docusate Calcium (STOOL SOFTENER PO) Take by mouth.   LACTOBACILLUS PROBIOTIC PO Take 1 capsule by mouth daily.   loratadine (CLARITIN) 10 MG tablet Take 1 tablet (10 mg total) by mouth daily. (Patient taking differently: Take 10 mg by mouth as needed.)   LORazepam (ATIVAN) 1 MG tablet Take 1 tablet (1 mg total) by mouth at bedtime as needed for sleep.   PARoxetine (PAXIL) 10 MG tablet Take 1 tablet (10 mg total) by mouth daily. after dinner   sodium chloride (OCEAN) 0.65 % SOLN nasal spray Place 2 sprays into both nostrils daily as needed for congestion.   UNABLE TO FIND CBD Oil 500mg  (no THC) 1 dropper under tongue every morning   No facility-administered encounter medications on file as of 12/16/2022.    Allergies (verified) Patient has no known allergies.   History: Past Medical History:  Diagnosis Date   CAP (community acquired pneumonia) 05/08/2021   Family history of breast cancer    Family history of colon cancer    Family history of pancreatic cancer    Hyperlipidemia    220 per patient   Personal history of tobacco use, presenting hazards to health 06/12/2015   Pneumonia due to COVID-19 virus 05/08/2021  Sepsis (HCC) 05/08/2021   Visit for preventive health examination 04/17/2013   .    Past Surgical History:  Procedure Laterality Date   APPENDECTOMY  11-Jan-1967   COLONOSCOPY     COLONOSCOPY     COLONOSCOPY WITH PROPOFOL N/A 09/20/2020   Procedure: COLONOSCOPY WITH PROPOFOL;  Surgeon: Earline Mayotte, MD;  Location: [facility] ENDOSCOPY;  Service: Endoscopy;  Laterality: N/A;   Family History  Problem Relation Age of Onset   Atrial fibrillation Mother    Cancer Father 71       colon   Diabetes Sister    Cancer Brother 3       pancreatic   Colon polyps Brother    Stroke Maternal Aunt    Cancer Paternal Uncle         unk type   Breast cancer Maternal Grandmother 2   Social History   Socioeconomic History   Marital status: Widowed    Spouse name: Not on file   Number of children: 1   Years of education: Not on file   Highest education level: Not on file  Occupational History   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)  Tobacco Use   Smoking status: Every Day    Current packs/day: 1.00    Average packs/day: 1 pack/day for 56.6 years (56.6 ttl pk-yrs)    Types: Cigarettes    Start date: 1967/01/11   Smokeless tobacco: Never   Tobacco comments:    0.5PPD 09/01/2022  Vaping Use   Vaping status: Never Used  Substance and Sexual Activity   Alcohol use: Yes    Alcohol/week: 3.0 standard drinks of alcohol    Types: 3 Glasses of wine per week    Comment: 1 glass of wine 3 days a week   Drug use: No   Sexual activity: Not Currently  Other Topics Concern   Not on file  Social History Narrative   Widow, husband passed away in 2019-01-11, 1 son, 2 grandchildren and 2 great-grandchildren   Still working 30 hrs/week   Social Determinants of Health   Financial Resource Strain: Low Risk  (12/16/2022)   Overall Financial Resource Strain (CARDIA)    Difficulty of Paying Living Expenses: Not hard at all  Food Insecurity: No Food Insecurity (12/16/2022)   Hunger Vital Sign    Worried About Running Out of Food in the Last Year: Never true    Ran Out of Food in the Last Year: Never true  Transportation Needs: No Transportation Needs (12/16/2022)   PRAPARE - Administrator, Civil Service (Medical): No    Lack of Transportation (Non-Medical): No  Physical Activity: Sufficiently Active (12/16/2022)   Exercise Vital Sign    Days of Exercise per Week: 5 days    Minutes of Exercise per Session: 70 min  Stress: Stress Concern Present (12/16/2022)   [employer] of Occupational Health - Occupational Stress Questionnaire    Feeling of Stress : To some extent  Social Connections: Moderately Isolated  (12/16/2022)   Social Connection and Isolation Panel [NHANES]    Frequency of Communication with Friends and Family: More than three times a week    Frequency of Social Gatherings with Friends and Family: More than three times a week    Attends Religious Services: More than 4 times per year    Active Member of [employer] or Organizations: No    Attends Banker Meetings: Never    Marital Status: Widowed    Tobacco Counseling Ready  to quit: Not Answered Counseling given: Not Answered Tobacco comments: 0.5PPD 09/01/2022   Clinical Intake:  Pre-visit preparation completed: Yes  Pain : No/denies pain     BMI - recorded: 17.97 Nutritional Status: BMI <19  Underweight Nutritional Risks: None Diabetes: No  How often do you need to have someone help you when you read instructions, pamphlets, or other written materials from your doctor or pharmacy?: 1 - Never  Interpreter Needed?: No  Information entered by :: Tora Kindred, CMA   Activities of Daily Living    12/16/2022    9:03 AM  In your present state of health, do you have any difficulty performing the following activities:  Hearing? 0  Vision? 0  Difficulty concentrating or making decisions? 0  Walking or climbing stairs? 0  Dressing or bathing? 0  Doing errands, shopping? 0  Preparing Food and eating ? N  Using the Toilet? N  In the past six months, have you accidently leaked urine? N  Do you have problems with loss of bowel control? N  Managing your Medications? N  Managing your Finances? N  Housekeeping or managing your Housekeeping? N    Patient Care Team: Sherlene Shams, MD as PCP - General (Internal Medicine)  Indicate any recent Medical Services you may have received from other than [facility] providers in the past year (date may be approximate).     Assessment:   This is a routine wellness examination for [place].  Hearing/Vision screen Hearing Screening - Comments:: Denies hearing loss  Dietary  issues and exercise activities discussed:     Goals Addressed               This Visit's Progress     Quit Smoking (pt-stated)        Depression Screen    12/16/2022    9:17 AM 07/17/2022    8:04 AM 01/16/2022    8:31 AM 01/14/2022    2:29 PM 12/08/2021    2:26 PM 12/03/2021    8:26 AM 12/02/2020    9:13 AM  PHQ 2/9 Scores  PHQ - 2 Score 0 0 0 0 0 0 0  PHQ- 9 Score 0          Fall Risk    12/16/2022    9:21 AM 07/17/2022    8:04 AM 01/16/2022    8:31 AM 01/14/2022    2:28 PM 12/08/2021    2:25 PM  Fall Risk   Falls in the past year? 0 0 0 0 0  Number falls in past yr: 0 0 0  0  Injury with Fall? 0 0 0  0  Risk for fall due to : No Fall Risks No Fall Risks No Fall Risks No Fall Risks No Fall Risks  Follow up Falls prevention discussed Falls evaluation completed Falls evaluation completed Falls evaluation completed Falls evaluation completed    MEDICARE RISK AT HOME:  Medicare Risk at Home - 12/16/22 0921     Any stairs in or around the home? No    If so, are there any without handrails? No    Home free of loose throw rugs in walkways, pet beds, electrical cords, etc? Yes    Adequate lighting in your home to reduce risk of falls? Yes    Life alert? No    Use of a cane, walker or w/c? No    Grab bars in the bathroom? Yes    Shower chair or bench in shower? No  Elevated toilet seat or a handicapped toilet? Yes             TIMED UP AND GO:  Was the test performed?  No    Cognitive Function:        12/16/2022    9:22 AM 12/03/2021    8:43 AM 12/02/2020    9:38 AM 11/30/2019    9:20 AM 11/29/2018    9:15 AM  6CIT Screen  What Year? 0 points 0 points 0 points 0 points 0 points  What month? 0 points 0 points 0 points 0 points 0 points  What time? 0 points 0 points 0 points 0 points 0 points  Count back from 20 0 points 0 points 0 points  0 points  Months in reverse 0 points 0 points 0 points 0 points 0 points  Repeat phrase 0 points 0 points 0 points  0 points   Total Score 0 points 0 points 0 points  0 points    Immunizations Immunization History  Administered Date(s) Administered   Fluad Quad(high Dose 65+) 01/16/2021, 02/24/2022   Influenza Split 01/16/2013, 01/27/2014, 01/24/2015   Influenza Whole 03/04/2011, 01/27/2012   Influenza, High Dose Seasonal PF 02/03/2017, 01/11/2018   Influenza-Unspecified 01/24/2016, 02/05/2017, 02/07/2019, 02/13/2020   PFIZER(Purple Top)SARS-COV-2 Vaccination 07/05/2019, 07/26/2019   Pneumococcal Conjugate-13 05/02/2014   Pneumococcal Polysaccharide-23 04/11/2012, 06/16/2017   Tdap 04/07/2011, 02/21/2021   Zoster Recombinant(Shingrix) 09/26/2017, 02/03/2018    TDAP status: Up to date  Flu Vaccine status: Due, Education has been provided regarding the importance of this vaccine. Advised may receive this vaccine at local pharmacy or Health Dept. Aware to provide a copy of the vaccination record if obtained from local pharmacy or Health Dept. Verbalized acceptance and understanding.  Pneumococcal vaccine status: Up to date  Covid-19 vaccine status: Declined, Education has been provided regarding the importance of this vaccine but patient still declined. Advised may receive this vaccine at local pharmacy or Health Dept.or vaccine clinic. Aware to provide a copy of the vaccination record if obtained from local pharmacy or Health Dept. Verbalized acceptance and understanding.  Qualifies for Shingles Vaccine? Yes   Zostavax completed No   Shingrix Completed?: Yes  Screening Tests Health Maintenance  Topic Date Due   COVID-19 Vaccine (3 - Pfizer risk series) 08/23/2019   DEXA SCAN  07/25/2021   INFLUENZA VACCINE  12/10/2022   Lung Cancer Screening  03/19/2023   MAMMOGRAM  08/11/2023   Medicare Annual Wellness (AWV)  12/16/2023   Colonoscopy  09/20/2025   DTaP/Tdap/Td (3 - Td or Tdap) 02/22/2031   Pneumonia Vaccine 67+ Years old  Completed   Hepatitis C Screening  Completed   Zoster Vaccines- Shingrix   Completed   HPV VACCINES  Aged Out    Health Maintenance  Health Maintenance Due  Topic Date Due   COVID-19 Vaccine (3 - Pfizer risk series) 08/23/2019   DEXA SCAN  07/25/2021   INFLUENZA VACCINE  12/10/2022    Colorectal cancer screening: Type of screening: Colonoscopy. Completed 09/20/20. Repeat every 5 years  Mammogram status: Completed 09/10/22. Repeat every year  Bone Density status: Completed 07/26/19. Results reflect: Bone density results: OSTEOPOROSIS. Repeat every 2 years.  Lung Cancer Screening: (Low Dose CT Chest recommended if Age 47-80 years, 20 pack-year currently smoking OR have quit w/in 15years.) does qualify.   Lung Cancer Screening Referral: 12/16/22  Additional Screening:  Hepatitis C Screening: does not qualify; Completed 06/24/16  Vision Screening: Recommended annual ophthalmology exams for early detection of  glaucoma and other disorders of the eye.  Dental Screening: Recommended annual dental exams for proper oral hygiene   Community Resource Referral / Chronic Care Management: CRR required this visit?  No   CCM required this visit?  No     Plan:     I have personally reviewed and noted the following in the patient's chart:   Medical and social history Use of alcohol, tobacco or illicit drugs  Current medications and supplements including opioid prescriptions. Patient is not currently taking opioid prescriptions. Functional ability and status Nutritional status Physical activity Advanced directives List of other physicians Hospitalizations, surgeries, and ER visits in previous 12 months Vitals Screenings to include cognitive, depression, and falls Referrals and appointments  In addition, I have reviewed and discussed with patient certain preventive protocols, quality metrics, and best practice recommendations. A written personalized care plan for preventive services as well as general preventive health recommendations were provided to  patient.     Tora Kindred, CMA   12/16/2022   After Visit Summary: (Declined) Due to this being a telephonic visit, with patients personalized plan was offered to patient but patient Declined AVS at this time   Nurse Notes:  Orders placed for DEXA and low dose lung CT. Patient declined all future covid vaccines. Will get flu shot in the fall at local [facility].   I have reviewed the above information and agree with above.   Duncan Dull, MD

## 2022-12-17 ENCOUNTER — Telehealth: Payer: Self-pay | Admitting: Internal Medicine

## 2022-12-17 NOTE — Telephone Encounter (Signed)
Lft pt vm to call ofc to sch CT. thanks 

## 2022-12-18 NOTE — Telephone Encounter (Signed)
Pt returned Kimberly Schmitt call. Unable to transfer. Pt stated her best contact is [phone].

## 2023-01-12 ENCOUNTER — Other Ambulatory Visit (INDEPENDENT_AMBULATORY_CARE_PROVIDER_SITE_OTHER): Payer: Medicare Other

## 2023-01-12 DIAGNOSIS — E559 Vitamin D deficiency, unspecified: Secondary | ICD-10-CM

## 2023-01-12 DIAGNOSIS — R5383 Other fatigue: Secondary | ICD-10-CM

## 2023-01-12 DIAGNOSIS — R7301 Impaired fasting glucose: Secondary | ICD-10-CM | POA: Diagnosis not present

## 2023-01-12 DIAGNOSIS — E7800 Pure hypercholesterolemia, unspecified: Secondary | ICD-10-CM

## 2023-01-12 LAB — LIPID PANEL
Cholesterol: 173 mg/dL (ref 0–200)
HDL: 80 mg/dL (ref >39.00)
LDL Cholesterol: 73 mg/dL (ref 0–99)
NonHDL: 92.94
Total CHOL/HDL Ratio: 2
Triglycerides: 99 mg/dL (ref 0.0–149.0)
VLDL: 19.8 mg/dL (ref 0.0–40.0)

## 2023-01-12 LAB — CBC WITH DIFFERENTIAL/PLATELET
Basophils Absolute: 0 10*3/uL (ref 0.0–0.1)
Basophils Relative: 0.4 % (ref 0.0–3.0)
Eosinophils Absolute: 0 10*3/uL (ref 0.0–0.7)
Eosinophils Relative: 0.4 % (ref 0.0–5.0)
HCT: 46.1 % — ABNORMAL HIGH (ref 36.0–46.0)
Hemoglobin: 14.8 g/dL (ref 12.0–15.0)
Lymphocytes Relative: 17.3 % (ref 12.0–46.0)
Lymphs Abs: 1.7 10*3/uL (ref 0.7–4.0)
MCHC: 32 g/dL (ref 30.0–36.0)
MCV: 96.6 fl (ref 78.0–100.0)
Monocytes Absolute: 0.6 10*3/uL (ref 0.1–1.0)
Monocytes Relative: 6.7 % (ref 3.0–12.0)
Neutro Abs: 7.3 10*3/uL (ref 1.4–7.7)
Neutrophils Relative %: 75.2 % (ref 43.0–77.0)
Platelets: 337 10*3/uL (ref 150.0–400.0)
RBC: 4.77 Mil/uL (ref 3.87–5.11)
RDW: 14.6 % (ref 11.5–15.5)
WBC: 9.7 10*3/uL (ref 4.0–10.5)

## 2023-01-12 LAB — COMPREHENSIVE METABOLIC PANEL
ALT: 17 U/L (ref 0–35)
AST: 20 U/L (ref 0–37)
Albumin: 4.5 g/dL (ref 3.5–5.2)
Alkaline Phosphatase: 51 U/L (ref 39–117)
BUN: 9 mg/dL (ref 6–23)
CO2: 28 meq/L (ref 19–32)
Calcium: 9.6 mg/dL (ref 8.4–10.5)
Chloride: 101 meq/L (ref 96–112)
Creatinine, Ser: 0.7 mg/dL (ref 0.40–1.20)
GFR: 85.47 mL/min (ref >60.00)
Glucose, Bld: 89 mg/dL (ref 70–99)
Potassium: 4.5 meq/L (ref 3.5–5.1)
Sodium: 139 meq/L (ref 135–145)
Total Bilirubin: 0.8 mg/dL (ref 0.2–1.2)
Total Protein: 6.9 g/dL (ref 6.0–8.3)

## 2023-01-12 LAB — HEMOGLOBIN A1C: Hgb A1c MFr Bld: 5.9 % (ref 4.6–6.5)

## 2023-01-12 LAB — LDL CHOLESTEROL, DIRECT: Direct LDL: 83 mg/dL

## 2023-01-13 LAB — VITAMIN D 25 HYDROXY (VIT D DEFICIENCY, FRACTURES): VITD: 57.33 ng/mL (ref 30.00–100.00)

## 2023-01-13 LAB — TSH: TSH: 1.26 u[IU]/mL (ref 0.35–5.50)

## 2023-01-20 ENCOUNTER — Encounter: Payer: Self-pay | Admitting: Internal Medicine

## 2023-01-20 ENCOUNTER — Ambulatory Visit (INDEPENDENT_AMBULATORY_CARE_PROVIDER_SITE_OTHER): Payer: Medicare Other | Admitting: Internal Medicine

## 2023-01-20 VITALS — BP 124/72 | HR 80 | Temp 97.9°F | Resp 16 | Ht 59.0 in | Wt 90.0 lb

## 2023-01-20 DIAGNOSIS — Z716 Tobacco abuse counseling: Secondary | ICD-10-CM | POA: Diagnosis not present

## 2023-01-20 DIAGNOSIS — R7989 Other specified abnormal findings of blood chemistry: Secondary | ICD-10-CM | POA: Diagnosis not present

## 2023-01-20 DIAGNOSIS — E673 Hypervitaminosis D: Secondary | ICD-10-CM | POA: Diagnosis not present

## 2023-01-20 DIAGNOSIS — M810 Age-related osteoporosis without current pathological fracture: Secondary | ICD-10-CM | POA: Diagnosis not present

## 2023-01-20 DIAGNOSIS — E7800 Pure hypercholesterolemia, unspecified: Secondary | ICD-10-CM

## 2023-01-20 DIAGNOSIS — F5105 Insomnia due to other mental disorder: Secondary | ICD-10-CM | POA: Diagnosis not present

## 2023-01-20 DIAGNOSIS — F419 Anxiety disorder, unspecified: Secondary | ICD-10-CM | POA: Diagnosis not present

## 2023-01-20 DIAGNOSIS — Z23 Encounter for immunization: Secondary | ICD-10-CM

## 2023-01-20 DIAGNOSIS — R911 Solitary pulmonary nodule: Secondary | ICD-10-CM

## 2023-01-20 DIAGNOSIS — D75839 Thrombocytosis, unspecified: Secondary | ICD-10-CM

## 2023-01-20 NOTE — Assessment & Plan Note (Signed)
She is aware of the risks and continues to smoke  1/2 pack daily.  Reviewed chest CT from November ; recommended continued annual screening

## 2023-01-20 NOTE — Assessment & Plan Note (Signed)
.   She has not used  the lorazepam more than twice weekly . The risks and benefits of benzodiazepine use were discussed with patient today including excessive sedation leading to respiratory depression,  impaired thinking/driving, and addiction.  Patient was advised to avoid concurrent use with alcohol, to use medication only as needed and not to share with others  .

## 2023-01-20 NOTE — Assessment & Plan Note (Signed)
She is tolerating  atorvastatin 20 mg daily , started for management off 11% risk of CAD by [facility] in 2018 and presence of atherosclerosis on CT  She has no side effects and liver enzymes are normal. No changes today  Lab Results  Component Value Date   CHOL 173 01/12/2023   HDL 80.00 01/12/2023   LDLCALC 73 01/12/2023   LDLDIRECT 83.0 01/12/2023   TRIG 99.0 01/12/2023   CHOLHDL 2 01/12/2023   Lab Results  Component Value Date   ALT 17 01/12/2023   AST 20 01/12/2023   ALKPHOS 51 01/12/2023   BILITOT 0.8 01/12/2023

## 2023-01-20 NOTE — Assessment & Plan Note (Signed)
Continue  use Prolia., next dose due in December 2024  Discussed the current controversies surrounding the risks and benefits of calcium supplementation.  Encouraged her to increase dietary calcium through natural foods including almond/coconut milk.Vit d supplementation has been suspended due to level > 100 on low dose supplement oct 2023.

## 2023-01-20 NOTE — Assessment & Plan Note (Signed)
Patient  Is asymptomatic.  Repeat level is pending she is not taking any Vit D supplements currently

## 2023-01-20 NOTE — Assessment & Plan Note (Signed)
NEXT annual CT IS Scheduled

## 2023-01-20 NOTE — Patient Instructions (Signed)
You are  up to date on everything!  You do not need any more pneumonia vaccines.    Your blood work is excellent !  No changes are needed .  Keep up the great work !   Do not resume vitamin D ,  but continue your calcium supplement  Thurmond Butts  will contact you for the Prolia injection

## 2023-01-20 NOTE — Progress Notes (Signed)
Subjective:  Patient ID: Kimberly Schmitt, female    DOB: [birth year]  Age: 72 y.o. MRN: [id number]  CC: The primary encounter diagnosis was Solitary pulmonary nodule. Diagnoses of Hypervitaminosis D, Pure hypercholesterolemia, Thrombocytosis, Age-related osteoporosis without current pathological fracture, Tobacco abuse counseling, Encounter for immunization, Insomnia secondary to anxiety, and High serum vitamin D were also pertinent to this visit.   HPI Kimberly Schmitt presents for  Chief Complaint  Patient presents with   Medical Management of Chronic Issues   1) osteoporosis  ;  taking calcium next prolia is due in December  . Vit d supplement held .  Walkng 3 miles daily and doing yardwork for exercise  2)  smoking  1/2 pack daily ,  (" I probably won't ever quit) . Getting annual CT screens  3) Aortic atherosclerosis:   Patient is tolerating high potency statin therapy  for risk reduction and  stabilization of atherosclerotic placque noted on prior chest CT which was reviewed during today's visit     Outpatient Medications Prior to Visit  Medication Sig Dispense Refill   aspirin 81 MG tablet Take 81 mg by mouth daily.     atorvastatin (LIPITOR) 20 MG tablet TAKE 1 TABLET BY MOUTH EVERY DAY 90 tablet 3   CALCIUM MAGNESIUM ZINC PO Take by mouth.     denosumab (PROLIA) 60 MG/ML SOSY injection Inject 60 mg into the skin every 6 (six) months.     Docusate Calcium (STOOL SOFTENER PO) Take by mouth.     LACTOBACILLUS PROBIOTIC PO Take 1 capsule by mouth daily.     loratadine (CLARITIN) 10 MG tablet Take 1 tablet (10 mg total) by mouth daily. (Patient taking differently: Take 10 mg by mouth as needed.) 30 tablet 0   LORazepam (ATIVAN) 1 MG tablet Take 1 tablet (1 mg total) by mouth at bedtime as needed for sleep. 30 tablet 2   PARoxetine (PAXIL) 10 MG tablet Take 1 tablet (10 mg total) by mouth daily. after dinner 90 tablet 2   sodium chloride (OCEAN) 0.65 % SOLN nasal spray Place 2  sprays into both nostrils daily as needed for congestion. 30 mL 11   UNABLE TO FIND CBD Oil 500mg  (no THC) 1 dropper under tongue every morning     No facility-administered medications prior to visit.    Review of Systems;  Patient denies headache, fevers, malaise, unintentional weight loss, skin rash, eye pain, sinus congestion and sinus pain, sore throat, dysphagia,  hemoptysis , cough, dyspnea, wheezing, chest pain, palpitations, orthopnea, edema, abdominal pain, nausea, melena, diarrhea, constipation, flank pain, dysuria, hematuria, urinary  Frequency, nocturia, numbness, tingling, seizures,  Focal weakness, Loss of consciousness,  Tremor, insomnia, depression, anxiety, and suicidal ideation.      Objective:  BP 124/72   Pulse 80   Temp 97.9 F (36.6 C)   Resp 16   Ht 4\' 11"  (1.499 m)   Wt 90 lb (40.8 kg)   SpO2 99%   BMI 18.18 kg/m   BP Readings from Last 3 Encounters:  01/20/23 124/72  09/01/22 128/70  07/17/22 116/70    Wt Readings from Last 3 Encounters:  01/20/23 90 lb (40.8 kg)  12/16/22 90 lb 8 oz (41.1 kg)  09/01/22 90 lb 6.4 oz (41 kg)    Physical Exam Vitals reviewed.  Constitutional:      General: She is not in acute distress.    Appearance: Normal appearance. She is normal weight. She is not ill-appearing, toxic-appearing or  diaphoretic.  HENT:     Head: Normocephalic.  Eyes:     General: No scleral icterus.       Right eye: No discharge.        Left eye: No discharge.     Conjunctiva/sclera: Conjunctivae normal.  Cardiovascular:     Rate and Rhythm: Normal rate and regular rhythm.     Heart sounds: Normal heart sounds.  Pulmonary:     Effort: Pulmonary effort is normal. No respiratory distress.     Breath sounds: Normal breath sounds.  Musculoskeletal:        General: Normal range of motion.  Skin:    General: Skin is warm and dry.  Neurological:     General: No focal deficit present.     Mental Status: She is alert and oriented to person,  place, and time. Mental status is at baseline.  Psychiatric:        Mood and Affect: Mood normal.        Behavior: Behavior normal.        Thought Content: Thought content normal.        Judgment: Judgment normal.    Lab Results  Component Value Date   HGBA1C 5.9 01/12/2023   HGBA1C 6.3 01/16/2022    Lab Results  Component Value Date   CREATININE 0.70 01/12/2023   CREATININE 0.76 07/08/2022   CREATININE 0.77 01/16/2022    Lab Results  Component Value Date   WBC 9.7 01/12/2023   HGB 14.8 01/12/2023   HCT 46.1 (H) 01/12/2023   PLT 337.0 01/12/2023   GLUCOSE 89 01/12/2023   CHOL 173 01/12/2023   TRIG 99.0 01/12/2023   HDL 80.00 01/12/2023   LDLDIRECT 83.0 01/12/2023   LDLCALC 73 01/12/2023   ALT 17 01/12/2023   AST 20 01/12/2023   NA 139 01/12/2023   K 4.5 01/12/2023   CL 101 01/12/2023   CREATININE 0.70 01/12/2023   BUN 9 01/12/2023   CO2 28 01/12/2023   TSH 1.26 01/12/2023   INR 1.1 05/09/2021   HGBA1C 5.9 01/12/2023    MM 3D DIAGNOSTIC MAMMOGRAM UNILATERAL RIGHT BREAST  Result Date: 08/19/2022 CLINICAL DATA:  RIGHT breast asymmetry callback seen on MLO view only EXAM: DIGITAL DIAGNOSTIC UNILATERAL RIGHT MAMMOGRAM WITH TOMOSYNTHESIS; ULTRASOUND RIGHT BREAST LIMITED TECHNIQUE: Right digital diagnostic mammography and breast tomosynthesis was performed.; Targeted ultrasound examination of the right breast was performed COMPARISON:  Previous exam(s). ACR Breast Density Category d: The breasts are extremely dense, which lowers the sensitivity of mammography. FINDINGS: The previously described finding does not persist with additional views, consistent with superimposed fibroglandular tissue. No suspicious mass, microcalcification, or other finding is identified. Due to breast density, targeted ultrasound was performed. On physical exam, no suspicious mass is appreciated. Targeted ultrasound was performed of the RIGHT upper outer breast. No suspicious cystic or solid mass is  seen at the site of screening mammographic concern. IMPRESSION: No mammographic or sonographic evidence of malignancy at the site of screening mammographic concern. RECOMMENDATION: Screening mammogram in one year.(Code:SM-B-01Y) I have discussed the findings and recommendations with the patient. If applicable, a reminder letter will be sent to the patient regarding the next appointment. BI-RADS CATEGORY  1: Negative. Electronically Signed   By: Meda Klinefelter M.D.   On: 08/19/2022 15:27  [place] LIMITED ULTRASOUND INCLUDING AXILLA RIGHT BREAST  Result Date: 08/19/2022 CLINICAL DATA:  RIGHT breast asymmetry callback seen on MLO view only EXAM: DIGITAL DIAGNOSTIC UNILATERAL RIGHT MAMMOGRAM WITH TOMOSYNTHESIS; ULTRASOUND RIGHT BREAST LIMITED  TECHNIQUE: Right digital diagnostic mammography and breast tomosynthesis was performed.; Targeted ultrasound examination of the right breast was performed COMPARISON:  Previous exam(s). ACR Breast Density Category d: The breasts are extremely dense, which lowers the sensitivity of mammography. FINDINGS: The previously described finding does not persist with additional views, consistent with superimposed fibroglandular tissue. No suspicious mass, microcalcification, or other finding is identified. Due to breast density, targeted ultrasound was performed. On physical exam, no suspicious mass is appreciated. Targeted ultrasound was performed of the RIGHT upper outer breast. No suspicious cystic or solid mass is seen at the site of screening mammographic concern. IMPRESSION: No mammographic or sonographic evidence of malignancy at the site of screening mammographic concern. RECOMMENDATION: Screening mammogram in one year.(Code:SM-B-01Y) I have discussed the findings and recommendations with the patient. If applicable, a reminder letter will be sent to the patient regarding the next appointment. BI-RADS CATEGORY  1: Negative. Electronically Signed   By: Meda Klinefelter M.D.   On:  08/19/2022 15:27   Assessment & Plan:  .Solitary pulmonary nodule Assessment & Plan: NEXT annual CT IS Scheduled    Hypervitaminosis D -     VITAMIN D 25 Hydroxy (Vit-D Deficiency, Fractures); Future  Pure hypercholesterolemia Assessment & Plan: She is tolerating  atorvastatin 20 mg daily , started for management off 11% risk of CAD by [facility]C in 2018 and presence of atherosclerosis on CT  She has no side effects and liver enzymes are normal. No changes today  Lab Results  Component Value Date   CHOL 173 01/12/2023   HDL 80.00 01/12/2023   LDLCALC 73 01/12/2023   LDLDIRECT 83.0 01/12/2023   TRIG 99.0 01/12/2023   CHOLHDL 2 01/12/2023   Lab Results  Component Value Date   ALT 17 01/12/2023   AST 20 01/12/2023   ALKPHOS 51 01/12/2023   BILITOT 0.8 01/12/2023     Orders: -     Comprehensive metabolic panel; Future -     Lipid Panel w/reflex Direct LDL; Future  Thrombocytosis  Age-related osteoporosis without current pathological fracture Assessment & Plan: Continue  use Prolia., next dose due in December 2024  Discussed the current controversies surrounding the risks and benefits of calcium supplementation.  Encouraged her to increase dietary calcium through natural foods including almond/coconut milk.Vit d supplementation has been suspended due to level > 100 on low dose supplement oct 2023.    Tobacco abuse counseling Assessment & Plan: She is aware of the risks and continues to smoke  1/2 pack daily.  Reviewed chest CT from November ; recommended continued annual screening    Encounter for immunization -     Flu Vaccine Trivalent High Dose (Fluad)  Insomnia secondary to anxiety Assessment & Plan: . She has not used  the lorazepam more than twice weekly . The risks and benefits of benzodiazepine use were discussed with patient today including excessive sedation leading to respiratory depression,  impaired thinking/driving, and addiction.  Patient was advised to avoid  concurrent use with alcohol, to use medication only as needed and not to share with others  .     High serum vitamin D Assessment & Plan: Patient  Is asymptomatic.  Repeat level is pending she is not taking any Vit D supplements currently       I provided 30 minutes of face-to-face time during this encounter reviewing patient's last visit with me, patient's  most recent visit with cardiology,  nephrology,  and neurology,  recent surgical and non surgical procedures, previous  labs and imaging studies, counseling on currently addressed issues,  and post visit ordering to diagnostics and therapeutics .   Follow-up: Return in about 6 months (around 07/20/2023).   Sherlene Shams, MD

## 2023-02-03 DIAGNOSIS — D2261 Melanocytic nevi of right upper limb, including shoulder: Secondary | ICD-10-CM | POA: Diagnosis not present

## 2023-02-03 DIAGNOSIS — D2271 Melanocytic nevi of right lower limb, including hip: Secondary | ICD-10-CM | POA: Diagnosis not present

## 2023-02-03 DIAGNOSIS — Z85828 Personal history of other malignant neoplasm of skin: Secondary | ICD-10-CM | POA: Diagnosis not present

## 2023-02-03 DIAGNOSIS — D2262 Melanocytic nevi of left upper limb, including shoulder: Secondary | ICD-10-CM | POA: Diagnosis not present

## 2023-02-03 DIAGNOSIS — D2272 Melanocytic nevi of left lower limb, including hip: Secondary | ICD-10-CM | POA: Diagnosis not present

## 2023-03-10 ENCOUNTER — Ambulatory Visit
Admission: RE | Admit: 2023-03-10 | Discharge: 2023-03-10 | Disposition: A | Discharge status: Home / Self Care | Payer: Medicare Other | Source: Ambulatory Visit | Attending: Internal Medicine | Admitting: Internal Medicine

## 2023-03-10 DIAGNOSIS — Z780 Asymptomatic menopausal state: Secondary | ICD-10-CM | POA: Insufficient documentation

## 2023-03-10 DIAGNOSIS — M85852 Other specified disorders of bone density and structure, left thigh: Secondary | ICD-10-CM | POA: Diagnosis not present

## 2023-03-10 DIAGNOSIS — M810 Age-related osteoporosis without current pathological fracture: Secondary | ICD-10-CM | POA: Diagnosis not present

## 2023-03-22 ENCOUNTER — Ambulatory Visit: Payer: Medicare Other

## 2023-03-23 ENCOUNTER — Ambulatory Visit
Admission: RE | Admit: 2023-03-23 | Discharge: 2023-03-23 | Disposition: A | Discharge status: Home / Self Care | Payer: Medicare Other | Source: Ambulatory Visit | Attending: Internal Medicine | Admitting: Internal Medicine

## 2023-03-23 DIAGNOSIS — F17210 Nicotine dependence, cigarettes, uncomplicated: Secondary | ICD-10-CM | POA: Insufficient documentation

## 2023-03-24 NOTE — Telephone Encounter (Signed)
Error

## 2023-03-30 MED ORDER — DENOSUMAB 60 MG/ML ~~LOC~~ SOSY
60.0000 mg | PREFILLED_SYRINGE | Freq: Once | SUBCUTANEOUS | Status: AC
Start: 2023-04-12 — End: 2023-04-20
  Administered 2023-04-20: 60 mg via SUBCUTANEOUS

## 2023-03-30 NOTE — Addendum Note (Signed)
Addended by: Warden Fillers on: 03/30/2023 03:26 PM   Modules accepted: Orders

## 2023-04-19 ENCOUNTER — Telehealth: Payer: Self-pay

## 2023-04-19 NOTE — Telephone Encounter (Signed)
-----   Message from Eulis Foster sent at 04/16/2023  9:52 PM EST ----- CT of the chest is normal. We will need to repeat the CT in 1 year

## 2023-04-19 NOTE — Telephone Encounter (Signed)
Called pt about CT Chest results . Was unable to leave vm due vm not being set up.

## 2023-04-20 ENCOUNTER — Telehealth: Payer: Self-pay

## 2023-04-20 ENCOUNTER — Ambulatory Visit: Payer: Medicare Other

## 2023-04-20 DIAGNOSIS — M810 Age-related osteoporosis without current pathological fracture: Secondary | ICD-10-CM | POA: Diagnosis not present

## 2023-04-20 MED ORDER — DENOSUMAB 60 MG/ML ~~LOC~~ SOSY
60.0000 mg | PREFILLED_SYRINGE | Freq: Once | SUBCUTANEOUS | Status: AC
Start: 2023-10-19 — End: 2023-10-29
  Administered 2023-10-29: 60 mg via SUBCUTANEOUS

## 2023-04-20 NOTE — Progress Notes (Signed)
Patient presented for Prolia injection to right arm, patient voiced no concerns nor showed any signs of distress during injection

## 2023-07-10 ENCOUNTER — Other Ambulatory Visit: Payer: Self-pay | Admitting: Internal Medicine

## 2023-07-19 ENCOUNTER — Other Ambulatory Visit: Payer: Medicare Other

## 2023-07-21 ENCOUNTER — Ambulatory Visit: Payer: Medicare Other | Admitting: Internal Medicine

## 2023-09-08 DIAGNOSIS — H6502 Acute serous otitis media, left ear: Secondary | ICD-10-CM | POA: Diagnosis not present

## 2023-09-08 DIAGNOSIS — H66002 Acute suppurative otitis media without spontaneous rupture of ear drum, left ear: Secondary | ICD-10-CM | POA: Diagnosis not present

## 2023-09-15 ENCOUNTER — Other Ambulatory Visit

## 2023-09-15 DIAGNOSIS — E7800 Pure hypercholesterolemia, unspecified: Secondary | ICD-10-CM

## 2023-09-15 DIAGNOSIS — E673 Hypervitaminosis D: Secondary | ICD-10-CM | POA: Diagnosis not present

## 2023-09-15 LAB — COMPREHENSIVE METABOLIC PANEL WITH GFR
ALT: 14 U/L (ref 0–35)
AST: 21 U/L (ref 0–37)
Albumin: 4.6 g/dL (ref 3.5–5.2)
Alkaline Phosphatase: 52 U/L (ref 39–117)
BUN: 16 mg/dL (ref 6–23)
CO2: 28 meq/L (ref 19–32)
Calcium: 9.6 mg/dL (ref 8.4–10.5)
Chloride: 100 meq/L (ref 96–112)
Creatinine, Ser: 0.75 mg/dL (ref 0.40–1.20)
GFR: 78.31 mL/min (ref >60.00)
Glucose, Bld: 83 mg/dL (ref 70–99)
Potassium: 5 meq/L (ref 3.5–5.1)
Sodium: 138 meq/L (ref 135–145)
Total Bilirubin: 0.6 mg/dL (ref 0.2–1.2)
Total Protein: 6.9 g/dL (ref 6.0–8.3)

## 2023-09-15 LAB — VITAMIN D 25 HYDROXY (VIT D DEFICIENCY, FRACTURES): VITD: 54.27 ng/mL (ref 30.00–100.00)

## 2023-09-16 DIAGNOSIS — H2511 Age-related nuclear cataract, right eye: Secondary | ICD-10-CM | POA: Diagnosis not present

## 2023-09-16 DIAGNOSIS — H2512 Age-related nuclear cataract, left eye: Secondary | ICD-10-CM | POA: Diagnosis not present

## 2023-09-16 DIAGNOSIS — H43813 Vitreous degeneration, bilateral: Secondary | ICD-10-CM | POA: Diagnosis not present

## 2023-09-16 DIAGNOSIS — H35363 Drusen (degenerative) of macula, bilateral: Secondary | ICD-10-CM | POA: Diagnosis not present

## 2023-09-16 LAB — LIPID PANEL W/REFLEX DIRECT LDL
Cholesterol: 144 mg/dL (ref <200)
HDL: 79 mg/dL (ref >=50)
LDL Cholesterol (Calc): 50 mg/dL
Non-HDL Cholesterol (Calc): 65 mg/dL (ref <130)
Total CHOL/HDL Ratio: 1.8 (calc) (ref <5.0)
Triglycerides: 69 mg/dL (ref <150)

## 2023-09-17 ENCOUNTER — Other Ambulatory Visit: Payer: Self-pay | Admitting: Internal Medicine

## 2023-09-17 DIAGNOSIS — Z1231 Encounter for screening mammogram for malignant neoplasm of breast: Secondary | ICD-10-CM

## 2023-09-22 ENCOUNTER — Ambulatory Visit (INDEPENDENT_AMBULATORY_CARE_PROVIDER_SITE_OTHER): Admitting: Internal Medicine

## 2023-09-22 ENCOUNTER — Encounter: Payer: Self-pay | Admitting: Internal Medicine

## 2023-09-22 ENCOUNTER — Telehealth: Payer: Self-pay

## 2023-09-22 VITALS — BP 118/78 | HR 99 | Temp 98.2°F | Ht 59.0 in | Wt 85.2 lb

## 2023-09-22 DIAGNOSIS — H748X2 Other specified disorders of left middle ear and mastoid: Secondary | ICD-10-CM | POA: Diagnosis not present

## 2023-09-22 DIAGNOSIS — H7292 Unspecified perforation of tympanic membrane, left ear: Secondary | ICD-10-CM

## 2023-09-22 DIAGNOSIS — E7800 Pure hypercholesterolemia, unspecified: Secondary | ICD-10-CM

## 2023-09-22 DIAGNOSIS — Z716 Tobacco abuse counseling: Secondary | ICD-10-CM | POA: Diagnosis not present

## 2023-09-22 DIAGNOSIS — I700 Atherosclerosis of aorta: Secondary | ICD-10-CM

## 2023-09-22 DIAGNOSIS — F411 Generalized anxiety disorder: Secondary | ICD-10-CM | POA: Diagnosis not present

## 2023-09-22 DIAGNOSIS — R7301 Impaired fasting glucose: Secondary | ICD-10-CM

## 2023-09-22 DIAGNOSIS — R5383 Other fatigue: Secondary | ICD-10-CM

## 2023-09-22 MED ORDER — LORAZEPAM 1 MG PO TABS: 1.0000 mg | ORAL_TABLET | Freq: Every evening | ORAL | 2 refills | Status: DC | PRN

## 2023-09-22 NOTE — Patient Instructions (Addendum)
 I have made a referral to  ENT to have your eardrum examined on the left.  I think you may have ruptured it during your infection  Your  vitamin D ,  cholesterol,  liver and kidney function have been reviewed and are excellent!   Please continue your current medications and  plan to repeat  Non fasting labs in 6 months.

## 2023-09-22 NOTE — Progress Notes (Unsigned)
 Subjective:  Patient ID: Kimberly Schmitt, female    DOB: 1950/05/16  Age: 73 y.o. MRN: [id number]  CC: There were no encounter diagnoses.   HPI Kimberly Schmitt presents for  Chief Complaint  Patient presents with   Medical Management of Chronic Issues    1) Grief:  mother Lenon Radar died 09-26-23 in Hospice . Patient developed ear pain and bleeding  the morning of mother's funeral days  was treated bu Urgent Care with topical ofloxacin and oral doxycycline  100 mg bid.  Hearing becomes  muffled and has not improved, much,  still hears her heart beat   2)   Outpatient Medications Prior to Visit  Medication Sig Dispense Refill   aspirin  81 MG tablet Take 81 mg by mouth daily.     atorvastatin  (LIPITOR) 20 MG tablet TAKE 1 TABLET BY MOUTH EVERY DAY 90 tablet 3   CALCIUM  MAGNESIUM ZINC PO Take by mouth.     denosumab  (PROLIA ) 60 MG/ML SOSY injection Inject 60 mg into the skin every 6 (six) months.     Docusate Calcium  (STOOL SOFTENER PO) Take by mouth.     LACTOBACILLUS PROBIOTIC PO Take 1 capsule by mouth daily.     loratadine  (CLARITIN ) 10 MG tablet Take 1 tablet (10 mg total) by mouth daily. (Patient taking differently: Take 10 mg by mouth as needed.) 30 tablet 0   LORazepam  (ATIVAN ) 1 MG tablet Take 1 tablet (1 mg total) by mouth at bedtime as needed for sleep. 30 tablet 2   PARoxetine  (PAXIL ) 10 MG tablet TAKE 1 TABLET (10 MG TOTAL) BY MOUTH DAILY. AFTER DINNER 90 tablet 2   sodium chloride  (OCEAN) 0.65 % SOLN nasal spray Place 2 sprays into both nostrils daily as needed for congestion. 30 mL 11   UNABLE TO FIND CBD Oil 500mg  (no THC) 1 dropper under tongue every morning     Facility-Administered Medications Prior to Visit  Medication Dose Route Frequency Provider Last Rate Last Admin   [START ON 10/19/2023] denosumab  (PROLIA ) injection 60 mg  60 mg Subcutaneous Once Ginia Rudell L, MD        Review of Systems;  Patient denies headache, fevers, malaise, unintentional weight  loss, skin rash, eye pain, sinus congestion and sinus pain, sore throat, dysphagia,  hemoptysis , cough, dyspnea, wheezing, chest pain, palpitations, orthopnea, edema, abdominal pain, nausea, melena, diarrhea, constipation, flank pain, dysuria, hematuria, urinary  Frequency, nocturia, numbness, tingling, seizures,  Focal weakness, Loss of consciousness,  Tremor, insomnia, depression, anxiety, and suicidal ideation.      Objective:  BP 118/78   Pulse 99   Temp 98.2 F (36.8 C)   Ht 4\' 11"  (1.499 m)   Wt 85 lb 3.2 oz (38.6 kg)   SpO2 97%   BMI 17.21 kg/m   BP Readings from Last 3 Encounters:  09/22/23 118/78  01/20/23 124/72  09/01/22 128/70    Wt Readings from Last 3 Encounters:  09/22/23 85 lb 3.2 oz (38.6 kg)  01/20/23 90 lb (40.8 kg)  12/16/22 90 lb 8 oz (41.1 kg)    Physical Exam  Lab Results  Component Value Date   HGBA1C 5.9 01/12/2023   HGBA1C 6.3 01/16/2022    Lab Results  Component Value Date   CREATININE 0.75 09/15/2023   CREATININE 0.70 01/12/2023   CREATININE 0.76 07/08/2022    Lab Results  Component Value Date   WBC 9.7 01/12/2023   HGB 14.8 01/12/2023   HCT 46.1 (H) 01/12/2023  PLT 337.0 01/12/2023   GLUCOSE 83 09/15/2023   CHOL 144 09/15/2023   TRIG 69 09/15/2023   HDL 79 09/15/2023   LDLDIRECT 83.0 01/12/2023   LDLCALC 50 09/15/2023   ALT 14 09/15/2023   AST 21 09/15/2023   NA 138 09/15/2023   K 5.0 09/15/2023   CL 100 09/15/2023   CREATININE 0.75 09/15/2023   BUN 16 09/15/2023   CO2 28 09/15/2023   TSH 1.26 01/12/2023   INR 1.1 05/09/2021   HGBA1C 5.9 01/12/2023    CT CHEST LUNG CA SCREEN LOW DOSE W/O CM Result Date: 04/16/2023 CLINICAL DATA:  73 year old female with 38 pack-year history of smoking. Lung cancer screening. EXAM: CT CHEST WITHOUT CONTRAST LOW-DOSE FOR LUNG CANCER SCREENING TECHNIQUE: Multidetector CT imaging of the chest was performed following the standard protocol without IV contrast. RADIATION DOSE REDUCTION: This  exam was performed according to the departmental dose-optimization program which includes automated exposure control, adjustment of the mA and/or kV according to patient size and/or use of iterative reconstruction technique. COMPARISON:  03/18/2022 FINDINGS: Cardiovascular: The heart size is normal. No substantial pericardial effusion. Mild atherosclerotic calcification is noted in the wall of the thoracic aorta. Coronary artery calcification is evident. Mediastinum/Nodes: No mediastinal lymphadenopathy. No evidence for gross hilar lymphadenopathy although assessment is limited by the lack of intravenous contrast on the current study. The esophagus has normal imaging features. There is no axillary lymphadenopathy. Lungs/Pleura: Centrilobular and paraseptal emphysema evident. Biapical pleuroparenchymal scarring noted, right greater than left. Calcified granulomata identified previously, again noted. No new suspicious pulmonary nodule or mass. No focal airspace consolidation. No pleural effusion. Upper Abdomen: Visualized portion of the upper abdomen shows no acute findings. Musculoskeletal: No worrisome lytic or sclerotic osseous abnormality. IMPRESSION: Lung-RADS 1, negative. Continue annual screening with low-dose chest CT without contrast in 12 months. Aortic Atherosclerosis (ICD10-I70.0) and Emphysema (ICD10-J43.9). Electronically Signed   By: Donnal Fusi M.D.   On: 04/16/2023 08:02    Assessment & Plan:  .There are no diagnoses linked to this encounter.   I spent 34 minutes on the day of this face to face encounter reviewing patient's  most recent visit with cardiology,  nephrology,  and neurology,  prior relevant surgical and non surgical procedures, recent  labs and imaging studies, counseling on weight management,  reviewing the assessment and plan with patient, and post visit ordering and reviewing of  diagnostics and therapeutics with patient  .   Follow-up: No follow-ups on file.   Thersia Flax, MD

## 2023-09-22 NOTE — Telephone Encounter (Signed)
 Lab orders have been placed

## 2023-09-22 NOTE — Telephone Encounter (Signed)
 Patient states at check-out that she would like to have her labs drawn prior to her physical so she can discuss the results with Dr. Creta Dolin.  I scheduled appointments for patient to have her physical and a lab visit prior to her physical, but orders will need to be entered.

## 2023-09-23 DIAGNOSIS — H7292 Unspecified perforation of tympanic membrane, left ear: Secondary | ICD-10-CM | POA: Insufficient documentation

## 2023-09-23 NOTE — Assessment & Plan Note (Signed)
 She is tolerating  atorvastatin  20 mg daily , started for management off 11% risk of CAD by [facility] in 2018 and presence of atherosclerosis on CT  She has no side effects and liver enzymes are normal. No changes today  Lab Results  Component Value Date   CHOL 144 09/15/2023   HDL 79 09/15/2023   LDLCALC 50 09/15/2023   LDLDIRECT 83.0 01/12/2023   TRIG 69 09/15/2023   CHOLHDL 1.8 09/15/2023   Lab Results  Component Value Date   ALT 14 09/15/2023   AST 21 09/15/2023   ALKPHOS 52 09/15/2023   BILITOT 0.6 09/15/2023

## 2023-09-23 NOTE — Assessment & Plan Note (Signed)
 Reviewed findings of prior CT scan today..  Patient is tolerating high potency statin therapy and LDL is now around goal of  70.   Lab Results  Component Value Date   CHOL 144 09/15/2023   HDL 79 09/15/2023   LDLCALC 50 09/15/2023   LDLDIRECT 83.0 01/12/2023   TRIG 69 09/15/2023   CHOLHDL 1.8 09/15/2023

## 2023-09-23 NOTE — Assessment & Plan Note (Addendum)
 Controlled with Paxil  continue paxil  daily and lorazepam  for prn use to manage insomnia . The risks and benefits of  Chronic  benzodiazepine use were reviewed  with patient today including increased risk of dementia,  Addiction, and seizures if abruptly withdrawn  .

## 2023-09-23 NOTE — Assessment & Plan Note (Signed)
She is aware of the risks and continues to smoke  1/2 pack daily.  Reviewed chest CT from November ; recommended continued annual screening

## 2023-09-23 NOTE — Assessment & Plan Note (Addendum)
 Secondary to untreated otitis media.Kimberly Schmitt  she was treated by Urgent Care with doxycycline  and drops. Exam of TM hindered by dried blood in canal. Referring to ENT for evaluation

## 2023-09-27 ENCOUNTER — Ambulatory Visit
Admission: RE | Admit: 2023-09-27 | Discharge: 2023-09-27 | Disposition: A | Discharge status: Home / Self Care | Source: Ambulatory Visit | Attending: Internal Medicine | Admitting: Internal Medicine

## 2023-09-27 DIAGNOSIS — Z1231 Encounter for screening mammogram for malignant neoplasm of breast: Secondary | ICD-10-CM | POA: Diagnosis not present

## 2023-09-28 DIAGNOSIS — H6502 Acute serous otitis media, left ear: Secondary | ICD-10-CM | POA: Diagnosis not present

## 2023-09-28 DIAGNOSIS — H6982 Other specified disorders of Eustachian tube, left ear: Secondary | ICD-10-CM | POA: Diagnosis not present

## 2023-10-22 ENCOUNTER — Telehealth: Payer: Self-pay | Admitting: *Deleted

## 2023-10-22 NOTE — Telephone Encounter (Signed)
 Left voicemail for pt to schedule next Prolia  Injection.  Due on or after 10/19/2023 Amount Due: $0 Prior Auth: Not required

## 2023-10-26 DIAGNOSIS — H9202 Otalgia, left ear: Secondary | ICD-10-CM | POA: Diagnosis not present

## 2023-10-26 DIAGNOSIS — H6982 Other specified disorders of Eustachian tube, left ear: Secondary | ICD-10-CM | POA: Diagnosis not present

## 2023-10-26 DIAGNOSIS — H903 Sensorineural hearing loss, bilateral: Secondary | ICD-10-CM | POA: Diagnosis not present

## 2023-10-27 ENCOUNTER — Other Ambulatory Visit: Payer: Self-pay | Admitting: Otolaryngology

## 2023-10-27 ENCOUNTER — Ambulatory Visit

## 2023-10-27 DIAGNOSIS — H9202 Otalgia, left ear: Secondary | ICD-10-CM

## 2023-10-29 ENCOUNTER — Ambulatory Visit

## 2023-10-29 DIAGNOSIS — M810 Age-related osteoporosis without current pathological fracture: Secondary | ICD-10-CM

## 2023-10-29 MED ORDER — DENOSUMAB 60 MG/ML ~~LOC~~ SOSY
60.0000 mg | PREFILLED_SYRINGE | Freq: Once | SUBCUTANEOUS | Status: AC
  Administered 2024-05-02: 60 mg via SUBCUTANEOUS

## 2023-10-29 NOTE — Progress Notes (Signed)
 After obtaining consent, and per orders of Dr.Tullo, MD injection of Prolia  given by Virgina Grills, CMA in R Arm (SubQ). Patient tolerated injection well.

## 2023-11-03 ENCOUNTER — Ambulatory Visit
Admission: RE | Admit: 2023-11-03 | Discharge: 2023-11-03 | Disposition: A | Discharge status: Home / Self Care | Source: Ambulatory Visit | Attending: Otolaryngology | Admitting: Otolaryngology

## 2023-11-03 DIAGNOSIS — H748X2 Other specified disorders of left middle ear and mastoid: Secondary | ICD-10-CM | POA: Diagnosis not present

## 2023-11-03 DIAGNOSIS — H9202 Otalgia, left ear: Secondary | ICD-10-CM

## 2023-11-11 ENCOUNTER — Ambulatory Visit: Payer: Self-pay

## 2023-11-11 DIAGNOSIS — R040 Epistaxis: Secondary | ICD-10-CM | POA: Diagnosis not present

## 2023-11-11 DIAGNOSIS — H7012 Chronic mastoiditis, left ear: Secondary | ICD-10-CM | POA: Diagnosis not present

## 2023-11-11 NOTE — Telephone Encounter (Signed)
 Attempted to call pt to follow up and see if she was going to UC.

## 2023-11-11 NOTE — Telephone Encounter (Signed)
 FYI Only or Action Required?: FYI only for provider.  Patient was last seen in primary care on 09/22/2023 by [name], MD. Called Nurse Triage reporting Epistaxis. Symptoms began yesterday. Interventions attempted: Rest, hydration, or home remedies. Symptoms are: gradually worsening.  Triage Disposition: See Physician Within 24 Hours  Patient/caregiver understands and will follow disposition?: Yes  Copied from CRM [id number]. Topic: Clinical - Red Word Triage >> Nov 11, 2023  9:38 AM Kimberly Schmitt wrote: Red Word that prompted transfer to Nurse Triage: Patient calling regarding nose bleed that started yesterday and into today. Reason for Disposition  [1] Bleeding recurs 3 or more times in 24 hours AND [2] direct pressure applied correctly  Answer Assessment - Initial Assessment Questions 1. AMOUNT OF BLEEDING: How bad is the bleeding? How much blood was lost? Has the bleeding stopped?   - MILD: needed a couple tissues   - MODERATE: needed many tissues   - SEVERE: large blood clots, soaked many tissues, lasted more than 30 minutes      moderate 2. ONSET: When did the nosebleed start?      11/10/23 6pm then stopped. Again at 3am today, this stopped. Now started again 15 minutes prior to call.  3. FREQUENCY: How many nosebleeds have you had in the last 24 hours?      3 4. RECURRENT SYMPTOMS: Have there been other recent nosebleeds? If Yes, ask: How long did it take you to stop the bleeding? What worked best?      yes 5. CAUSE: What do you think caused this nosebleed?     unsure 6. LOCAL FACTORS: Do you have any cold symptoms?, Have you been rubbing or picking at your nose?     denies  7. OTHER SYMPTOMS: Do you have any other symptoms? (e.g., lightheadedness)     Denies  Protocols used: Nosebleed-A-AH

## 2023-11-11 NOTE — Telephone Encounter (Signed)
 Spoke with pt and she stated that she went to [facility] walk in then she had to go see Dr. Milissa at 1 pm. Dr. Milissa ended up having to pack her nose because as soon as he went to look in her nose she started bleeding again. She will follow up with Dr. Milissa on Tuesday. She was advised that if the bleeding comes through the packing she needs to go to the ED.

## 2023-11-16 DIAGNOSIS — R040 Epistaxis: Secondary | ICD-10-CM | POA: Diagnosis not present

## 2023-11-25 DIAGNOSIS — R040 Epistaxis: Secondary | ICD-10-CM | POA: Diagnosis not present

## 2023-11-25 DIAGNOSIS — H9202 Otalgia, left ear: Secondary | ICD-10-CM | POA: Diagnosis not present

## 2024-01-06 DIAGNOSIS — R040 Epistaxis: Secondary | ICD-10-CM | POA: Diagnosis not present

## 2024-01-06 DIAGNOSIS — H6982 Other specified disorders of Eustachian tube, left ear: Secondary | ICD-10-CM | POA: Diagnosis not present

## 2024-01-06 DIAGNOSIS — H6121 Impacted cerumen, right ear: Secondary | ICD-10-CM | POA: Diagnosis not present

## 2024-02-01 ENCOUNTER — Ambulatory Visit (INDEPENDENT_AMBULATORY_CARE_PROVIDER_SITE_OTHER): Admitting: *Deleted

## 2024-02-01 VITALS — Ht 59.0 in | Wt 85.0 lb

## 2024-02-01 DIAGNOSIS — Z0000 Encounter for general adult medical examination without abnormal findings: Secondary | ICD-10-CM

## 2024-02-01 NOTE — Patient Instructions (Signed)
 Kimberly Schmitt,  Thank you for taking the time for your Medicare Wellness Visit. I appreciate your continued commitment to your health goals. Please review the care plan we discussed, and feel free to reach out if I can assist you further.  Medicare recommends these wellness visits once per year to help you and your care team stay ahead of potential health issues. These visits are designed to focus on prevention, allowing your provider to concentrate on managing your acute and chronic conditions during your regular appointments.  Please note that Annual Wellness Visits do not include a physical exam. Some assessments may be limited, especially if the visit was conducted virtually. If needed, we may recommend a separate in-person follow-up with your provider.  Ongoing Care Seeing your primary care provider every 3 to 6 months helps us  monitor your health and provide consistent, personalized care.  Remember to get your flu vaccine  Referrals If a referral was made during today's visit and you haven't received any updates within two weeks, please contact the referred provider directly to check on the status.  Recommended Screenings:  Health Maintenance  Topic Date Due   Flu Shot  12/10/2023   Screening for Lung Cancer  03/22/2024   Breast Cancer Screening  09/26/2024   Medicare Annual Wellness Visit  01/31/2025   DEXA scan (bone density measurement)  03/09/2025   Colon Cancer Screening  09/20/2025   DTaP/Tdap/Td vaccine (3 - Td or Tdap) 02/22/2031   Pneumococcal Vaccine for age over 64  Completed   Hepatitis C Screening  Completed   Zoster (Shingles) Vaccine  Completed   HPV Vaccine  Aged Out   Meningitis B Vaccine  Aged Out   COVID-19 Vaccine  Discontinued       02/01/2024    8:28 AM  Advanced Directives  Does Patient Have a Medical Advance Directive? Yes  Type of Estate agent of [place];Living will  Does patient want to make changes to medical advance  directive? No - Patient declined  Copy of Healthcare Power of Attorney in Chart? Yes - validated most recent copy scanned in chart (See row information)   Advance Care Planning is important because it: Ensures you receive medical care that aligns with your values, goals, and preferences. Provides guidance to your family and loved ones, reducing the emotional burden of decision-making during critical moments.  Vision: Annual vision screenings are recommended for early detection of glaucoma, cataracts, and diabetic retinopathy. These exams can also reveal signs of chronic conditions such as diabetes and high blood pressure.  Dental: Annual dental screenings help detect early signs of oral cancer, gum disease, and other conditions linked to overall health, including heart disease and diabetes.  Please see the attached documents for additional preventive care recommendations.

## 2024-02-01 NOTE — Progress Notes (Signed)
 Subjective:   KHAMANI DANIELY is a 73 y.o. who presents for a Medicare Wellness preventive visit.  As a reminder, Annual Wellness Visits don't include a physical exam, and some assessments may be limited, especially if this visit is performed virtually. We may recommend an in-person follow-up visit with your provider if needed.  Visit Complete: Virtual I connected with  Tilton DELENA Collier on 02/01/24 by a audio enabled telemedicine application and verified that I am speaking with the correct person using two identifiers.  Patient Location: Home  Provider Location: Home Office  I discussed the limitations of evaluation and management by telemedicine. The patient expressed understanding and agreed to proceed.  Vital Signs: Because this visit was a virtual/telehealth visit, some criteria may be missing or patient reported. Any vitals not documented were not able to be obtained and vitals that have been documented are patient reported.  VideoDeclined- This patient declined Librarian, academic. Therefore the visit was completed with audio only.  Persons Participating in Visit: Patient.  AWV Questionnaire: No: Patient Medicare AWV questionnaire was not completed prior to this visit.  Cardiac Risk Factors include: advanced age (>54men, >24 women);dyslipidemia;smoking/ tobacco exposure     Objective:    Today's Vitals   02/01/24 0813  Weight: 85 lb (38.6 kg)  Height: 4' 11 (1.499 m)   Body mass index is 17.17 kg/m.     02/01/2024    8:28 AM 12/16/2022    9:20 AM 12/03/2021    8:28 AM 02/21/2021    9:45 AM 12/02/2020    9:15 AM 09/20/2020    7:41 AM 01/13/2020    9:01 PM  Advanced Directives  Does Patient Have a Medical Advance Directive? Yes Yes Yes No Yes Yes Yes  Type of Estate agent of [place];Living will Healthcare Power of [place];Living will Healthcare Power of [place];Living will  Healthcare Power of [place];Living will  Living will Healthcare Power of [place];Living will  Does patient want to make changes to medical advance directive? No - Patient declined No - Patient declined No - Patient declined  No - Patient declined    Copy of Healthcare Power of Attorney in Chart? Yes - validated most recent copy scanned in chart (See row information) No - copy requested No - copy requested  No - copy requested    Would patient like information on creating a medical advance directive?    No - Patient declined       Current Medications (verified) Outpatient Encounter Medications as of 02/01/2024  Medication Sig   aspirin  81 MG tablet Take 81 mg by mouth daily.   atorvastatin  (LIPITOR) 20 MG tablet TAKE 1 TABLET BY MOUTH EVERY DAY   CALCIUM  MAGNESIUM ZINC PO Take by mouth.   denosumab  (PROLIA ) 60 MG/ML SOSY injection Inject 60 mg into the skin every 6 (six) months.   Docusate Calcium  (STOOL SOFTENER PO) Take by mouth.   LACTOBACILLUS PROBIOTIC PO Take 1 capsule by mouth daily.   loratadine  (CLARITIN ) 10 MG tablet Take 1 tablet (10 mg total) by mouth daily. (Patient taking differently: Take 10 mg by mouth as needed.)   LORazepam  (ATIVAN ) 1 MG tablet Take 1 tablet (1 mg total) by mouth at bedtime as needed for sleep.   PARoxetine  (PAXIL ) 10 MG tablet TAKE 1 TABLET (10 MG TOTAL) BY MOUTH DAILY. AFTER DINNER   sodium chloride  (OCEAN) 0.65 % SOLN nasal spray Place 2 sprays into both nostrils daily as needed for congestion.  UNABLE TO FIND CBD Oil 500mg  (no THC) 1 dropper under tongue every morning   Facility-Administered Encounter Medications as of 02/01/2024  Medication   [START ON 04/29/2024] denosumab  (PROLIA ) injection 60 mg    Allergies (verified) Patient has no known allergies.   History: Past Medical History:  Diagnosis Date   CAP (community acquired pneumonia) 05/08/2021   Family history of breast cancer    Family history of colon cancer    Family history of pancreatic cancer    Hyperlipidemia    220 per  patient   Personal history of tobacco use, presenting hazards to health 06/12/2015   Pneumonia due to COVID-19 virus 05/08/2021   Sepsis (HCC) 05/08/2021   Visit for preventive health examination 04/17/2013   .    Past Surgical History:  Procedure Laterality Date   APPENDECTOMY  02-21-1967   COLONOSCOPY     COLONOSCOPY     COLONOSCOPY WITH PROPOFOL  N/A 09/20/2020   Procedure: COLONOSCOPY WITH PROPOFOL ;  Surgeon: Dessa Reyes ORN, MD;  Location: [facility] ENDOSCOPY;  Service: Endoscopy;  Laterality: N/A;   Family History  Problem Relation Age of Onset   Atrial fibrillation Mother    Cancer Father 28       colon   Diabetes Sister    Cancer Brother 51       pancreatic   Colon polyps Brother    Stroke Maternal Aunt    Cancer Paternal Uncle        unk type   Breast cancer Maternal Grandmother 57   Social History   Socioeconomic History   Marital status: Widowed    Spouse name: Not on file   Number of children: 1   Years of education: Not on file   Highest education level: Not on file  Occupational History   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)  Tobacco Use   Smoking status: Every Day    Current packs/day: 1.00    Average packs/day: 1 pack/day for 57.7 years (57.7 ttl pk-yrs)    Types: Cigarettes    Start date: 02-21-67   Smokeless tobacco: Never   Tobacco comments:    0.5PPD 09/01/2022  Vaping Use   Vaping status: Never Used  Substance and Sexual Activity   Alcohol use: Yes    Alcohol/week: 3.0 standard drinks of alcohol    Types: 3 Glasses of wine per week    Comment: 1 glass of wine 3 days a week   Drug use: No   Sexual activity: Not Currently  Other Topics Concern   Not on file  Social History Narrative   Widow, husband passed away in February 21, 2019, 1 son, 2 grandchildren and 2 great-grandchildren   Still working 30 hrs/week   Social Drivers of Corporate investment banker Strain: Low Risk  (02/01/2024)   Overall Financial Resource Strain (CARDIA)    Difficulty of  Paying Living Expenses: Not hard at all  Food Insecurity: No Food Insecurity (02/01/2024)   Hunger Vital Sign    Worried About Running Out of Food in the Last Year: Never true    Ran Out of Food in the Last Year: Never true  Transportation Needs: No Transportation Needs (02/01/2024)   PRAPARE - Administrator, Civil Service (Medical): No    Lack of Transportation (Non-Medical): No  Physical Activity: Sufficiently Active (02/01/2024)   Exercise Vital Sign    Days of Exercise per Week: 5 days    Minutes of Exercise per Session: 60 min  Stress:  Stress Concern Present (02/01/2024)   [employer] of Occupational Health - Occupational Stress Questionnaire    Feeling of Stress: To some extent  Social Connections: Moderately Isolated (02/01/2024)   Social Connection and Isolation Panel    Frequency of Communication with Friends and Family: More than three times a week    Frequency of Social Gatherings with Friends and Family: More than three times a week    Attends Religious Services: More than 4 times per year    Active Member of [employer] or Organizations: No    Attends Banker Meetings: Never    Marital Status: Widowed    Tobacco Counseling Ready to quit: No Counseling given: Not Answered Tobacco comments: 0.5PPD 09/01/2022    Clinical Intake:  Pre-visit preparation completed: Yes  Pain : No/denies pain     BMI - recorded: 17.17 Nutritional Status: BMI <19  Underweight Nutritional Risks: None Diabetes: No  Lab Results  Component Value Date   HGBA1C 5.9 01/12/2023   HGBA1C 6.3 01/16/2022     How often do you need to have someone help you when you read instructions, pamphlets, or other written materials from your doctor or pharmacy?: 1 - Never  Interpreter Needed?: No  Information entered by :: R. Stephaine Breshears LPN   Activities of Daily Living     02/01/2024    8:15 AM  In your present state of health, do you have any difficulty performing the  following activities:  Hearing? 0  Vision? 0  Difficulty concentrating or making decisions? 0  Walking or climbing stairs? 0  Dressing or bathing? 0  Doing errands, shopping? 0  Preparing Food and eating ? N  Using the Toilet? N  In the past six months, have you accidently leaked urine? N  Do you have problems with loss of bowel control? N  Managing your Medications? N  Managing your Finances? N  Housekeeping or managing your Housekeeping? N    Patient Care Team: [name], MD as PCP - General (Internal Medicine) Milissa Hamming, MD as Referring Physician (Otolaryngology)  I have updated your Care Teams any recent Medical Services you may have received from other providers in the past year.     Assessment:   This is a routine wellness examination for [place].  Hearing/Vision screen Hearing Screening - Comments:: No issues Vision Screening - Comments:: glasses   Goals Addressed             This Visit's Progress    Patient Stated       Wants to cut back on smoking        Depression Screen     02/01/2024    8:21 AM 09/22/2023    3:34 PM 12/16/2022    9:17 AM 07/17/2022    8:04 AM 01/16/2022    8:31 AM 01/14/2022    2:29 PM 12/08/2021    2:26 PM  PHQ 2/9 Scores  PHQ - 2 Score 0 0 0 0 0 0 0  PHQ- 9 Score 0 0 0        Fall Risk     02/01/2024    8:17 AM 09/22/2023    3:33 PM 12/16/2022    9:21 AM 07/17/2022    8:04 AM 01/16/2022    8:31 AM  Fall Risk   Falls in the past year? 0 0 0 0 0  Number falls in past yr: 0 0 0 0 0  Injury with Fall? 0 0 0 0 0  Risk  for fall due to : No Fall Risks No Fall Risks No Fall Risks No Fall Risks No Fall Risks  Follow up Falls evaluation completed;Falls prevention discussed Falls evaluation completed Falls prevention discussed Falls evaluation completed Falls evaluation completed      Data saved with a previous flowsheet row definition    MEDICARE RISK AT HOME:  Medicare Risk at Home Any stairs in or around the home?: Yes If  so, are there any without handrails?: No Home free of loose throw rugs in walkways, pet beds, electrical cords, etc?: Yes Adequate lighting in your home to reduce risk of falls?: Yes Life alert?: No Use of a cane, walker or w/c?: No Grab bars in the bathroom?: Yes Shower chair or bench in shower?: No Elevated toilet seat or a handicapped toilet?: Yes  TIMED UP AND GO:  Was the test performed?  No  Cognitive Function: 6CIT completed        02/01/2024    8:28 AM 12/16/2022    9:22 AM 12/03/2021    8:43 AM 12/02/2020    9:38 AM 11/30/2019    9:20 AM  6CIT Screen  What Year? 0 points 0 points 0 points 0 points 0 points  What month? 0 points 0 points 0 points 0 points 0 points  What time? 0 points 0 points 0 points 0 points 0 points  Count back from 20 0 points 0 points 0 points 0 points   Months in reverse 0 points 0 points 0 points 0 points 0 points  Repeat phrase 0 points 0 points 0 points 0 points   Total Score 0 points 0 points 0 points 0 points     Immunizations Immunization History  Administered Date(s) Administered   Fluad Quad(high Dose 65+) 01/16/2021, 02/24/2022   Fluad Trivalent(High Dose 65+) 01/20/2023   INFLUENZA, HIGH DOSE SEASONAL PF 02/03/2017, 01/11/2018   Influenza Split 01/16/2013, 01/27/2014, 01/24/2015   Influenza Whole 03/04/2011, 01/27/2012   Influenza-Unspecified 01/24/2016, 02/05/2017, 02/07/2019, 02/13/2020   PFIZER(Purple Top)SARS-COV-2 Vaccination 07/05/2019, 07/26/2019   Pneumococcal Conjugate-13 05/02/2014   Pneumococcal Polysaccharide-23 04/11/2012, 06/16/2017   Tdap 04/07/2011, 02/21/2021   Zoster Recombinant(Shingrix) 09/26/2017, 02/03/2018    Screening Tests Health Maintenance  Topic Date Due   Influenza Vaccine  12/10/2023   Lung Cancer Screening  03/22/2024   Mammogram  09/26/2024   Medicare Annual Wellness (AWV)  01/31/2025   DEXA SCAN  03/09/2025   Colonoscopy  09/20/2025   DTaP/Tdap/Td (3 - Td or Tdap) 02/22/2031   Pneumococcal  Vaccine: 50+ Years  Completed   Hepatitis C Screening  Completed   Zoster Vaccines- Shingrix  Completed   HPV VACCINES  Aged Out   Meningococcal B Vaccine  Aged Out   COVID-19 Vaccine  Discontinued    Health Maintenance Items Addressed: Discussed the need to update flu vaccine  Additional Screening:  Vision Screening: Recommended annual ophthalmology exams for early detection of glaucoma and other disorders of the eye. Is the patient up to date with their annual eye exam?  Yes  Who is the provider or what is the name of the office in which the patient attends annual eye exams? [facility]  Dental Screening: Recommended annual dental exams for proper oral hygiene  Community Resource Referral / Chronic Care Management: CRR required this visit?  No   CCM required this visit?  No   Plan:    I have personally reviewed and noted the following in the patient's chart:   Medical and social history Use  of alcohol, tobacco or illicit drugs  Current medications and supplements including opioid prescriptions. Patient is not currently taking opioid prescriptions. Functional ability and status Nutritional status Physical activity Advanced directives List of other physicians Hospitalizations, surgeries, and ER visits in previous 12 months Vitals Screenings to include cognitive, depression, and falls Referrals and appointments  In addition, I have reviewed and discussed with patient certain preventive protocols, quality metrics, and best practice recommendations. A written personalized care plan for preventive services as well as general preventive health recommendations were provided to patient.   Angeline Fredericks, LPN   [id number]   After Visit Summary: (Declined) Due to this being a telephonic visit, with patients personalized plan was offered to patient but patient Declined AVS at this time   Notes: Nothing significant to report at this time.

## 2024-02-04 DIAGNOSIS — L821 Other seborrheic keratosis: Secondary | ICD-10-CM | POA: Diagnosis not present

## 2024-02-04 DIAGNOSIS — D2262 Melanocytic nevi of left upper limb, including shoulder: Secondary | ICD-10-CM | POA: Diagnosis not present

## 2024-02-04 DIAGNOSIS — D225 Melanocytic nevi of trunk: Secondary | ICD-10-CM | POA: Diagnosis not present

## 2024-02-04 DIAGNOSIS — L720 Epidermal cyst: Secondary | ICD-10-CM | POA: Diagnosis not present

## 2024-02-04 DIAGNOSIS — Z85828 Personal history of other malignant neoplasm of skin: Secondary | ICD-10-CM | POA: Diagnosis not present

## 2024-02-04 DIAGNOSIS — D2261 Melanocytic nevi of right upper limb, including shoulder: Secondary | ICD-10-CM | POA: Diagnosis not present

## 2024-03-23 ENCOUNTER — Other Ambulatory Visit

## 2024-03-23 DIAGNOSIS — R5383 Other fatigue: Secondary | ICD-10-CM | POA: Diagnosis not present

## 2024-03-23 DIAGNOSIS — E7800 Pure hypercholesterolemia, unspecified: Secondary | ICD-10-CM

## 2024-03-23 DIAGNOSIS — R7301 Impaired fasting glucose: Secondary | ICD-10-CM | POA: Diagnosis not present

## 2024-03-23 LAB — CBC WITH DIFFERENTIAL/PLATELET
Basophils Absolute: 0.1 K/uL (ref 0.0–0.1)
Basophils Relative: 1.1 % (ref 0.0–3.0)
Eosinophils Absolute: 0.1 K/uL (ref 0.0–0.7)
Eosinophils Relative: 0.7 % (ref 0.0–5.0)
HCT: 44.8 % (ref 36.0–46.0)
Hemoglobin: 15.2 g/dL — ABNORMAL HIGH (ref 12.0–15.0)
Lymphocytes Relative: 24.3 % (ref 12.0–46.0)
Lymphs Abs: 2.1 K/uL (ref 0.7–4.0)
MCHC: 34 g/dL (ref 30.0–36.0)
MCV: 93.2 fl (ref 78.0–100.0)
Monocytes Absolute: 0.7 K/uL (ref 0.1–1.0)
Monocytes Relative: 7.9 % (ref 3.0–12.0)
Neutro Abs: 5.7 K/uL (ref 1.4–7.7)
Neutrophils Relative %: 66 % (ref 43.0–77.0)
Platelets: 333 K/uL (ref 150.0–400.0)
RBC: 4.8 Mil/uL (ref 3.87–5.11)
RDW: 15 % (ref 11.5–15.5)
WBC: 8.7 K/uL (ref 4.0–10.5)

## 2024-03-23 LAB — LIPID PANEL
Cholesterol: 171 mg/dL (ref 0–200)
HDL: 91 mg/dL (ref >39.00)
LDL Cholesterol: 65 mg/dL (ref 0–99)
NonHDL: 80.01
Total CHOL/HDL Ratio: 2
Triglycerides: 75 mg/dL (ref 0.0–149.0)
VLDL: 15 mg/dL (ref 0.0–40.0)

## 2024-03-23 LAB — COMPREHENSIVE METABOLIC PANEL WITH GFR
ALT: 15 U/L (ref 0–35)
AST: 21 U/L (ref 0–37)
Albumin: 4.6 g/dL (ref 3.5–5.2)
Alkaline Phosphatase: 45 U/L (ref 39–117)
BUN: 13 mg/dL (ref 6–23)
CO2: 28 meq/L (ref 19–32)
Calcium: 9.6 mg/dL (ref 8.4–10.5)
Chloride: 101 meq/L (ref 96–112)
Creatinine, Ser: 0.74 mg/dL (ref 0.40–1.20)
GFR: 79.29 mL/min (ref >60.00)
Glucose, Bld: 79 mg/dL (ref 70–99)
Potassium: 4.4 meq/L (ref 3.5–5.1)
Sodium: 140 meq/L (ref 135–145)
Total Bilirubin: 0.7 mg/dL (ref 0.2–1.2)
Total Protein: 7.1 g/dL (ref 6.0–8.3)

## 2024-03-23 LAB — TSH: TSH: 1.75 u[IU]/mL (ref 0.35–5.50)

## 2024-03-23 LAB — HEMOGLOBIN A1C: Hgb A1c MFr Bld: 6.1 % (ref 4.6–6.5)

## 2024-03-23 LAB — LDL CHOLESTEROL, DIRECT: Direct LDL: 66 mg/dL

## 2024-03-25 ENCOUNTER — Ambulatory Visit: Payer: Self-pay | Admitting: Internal Medicine

## 2024-03-27 ENCOUNTER — Ambulatory Visit: Admitting: Internal Medicine

## 2024-03-27 ENCOUNTER — Encounter: Payer: Self-pay | Admitting: Internal Medicine

## 2024-03-27 VITALS — BP 180/84 | HR 88 | Ht 59.0 in | Wt 82.6 lb

## 2024-03-27 DIAGNOSIS — M810 Age-related osteoporosis without current pathological fracture: Secondary | ICD-10-CM

## 2024-03-27 DIAGNOSIS — R1319 Other dysphagia: Secondary | ICD-10-CM

## 2024-03-27 DIAGNOSIS — F419 Anxiety disorder, unspecified: Secondary | ICD-10-CM

## 2024-03-27 DIAGNOSIS — Z23 Encounter for immunization: Secondary | ICD-10-CM

## 2024-03-27 DIAGNOSIS — I2510 Atherosclerotic heart disease of native coronary artery without angina pectoris: Secondary | ICD-10-CM | POA: Diagnosis not present

## 2024-03-27 DIAGNOSIS — Z716 Tobacco abuse counseling: Secondary | ICD-10-CM | POA: Diagnosis not present

## 2024-03-27 DIAGNOSIS — Z122 Encounter for screening for malignant neoplasm of respiratory organs: Secondary | ICD-10-CM

## 2024-03-27 DIAGNOSIS — R636 Underweight: Secondary | ICD-10-CM | POA: Diagnosis not present

## 2024-03-27 DIAGNOSIS — F5105 Insomnia due to other mental disorder: Secondary | ICD-10-CM | POA: Diagnosis not present

## 2024-03-27 DIAGNOSIS — R1310 Dysphagia, unspecified: Secondary | ICD-10-CM | POA: Insufficient documentation

## 2024-03-27 DIAGNOSIS — Z681 Body mass index (BMI) 19 or less, adult: Secondary | ICD-10-CM | POA: Insufficient documentation

## 2024-03-27 DIAGNOSIS — J439 Emphysema, unspecified: Secondary | ICD-10-CM | POA: Insufficient documentation

## 2024-03-27 DIAGNOSIS — Z1211 Encounter for screening for malignant neoplasm of colon: Secondary | ICD-10-CM

## 2024-03-27 DIAGNOSIS — D75839 Thrombocytosis, unspecified: Secondary | ICD-10-CM

## 2024-03-27 MED ORDER — PAROXETINE HCL 20 MG PO TABS: 20.0000 mg | ORAL_TABLET | Freq: Every day | ORAL | 1 refills | Status: AC

## 2024-03-27 MED ORDER — ATORVASTATIN CALCIUM 20 MG PO TABS: 20.0000 mg | ORAL_TABLET | Freq: Every day | ORAL | 3 refills | Status: AC

## 2024-03-27 MED ORDER — PAROXETINE HCL 10 MG PO TABS: 10.0000 mg | ORAL_TABLET | Freq: Every day | ORAL | 3 refills | Status: DC

## 2024-03-27 NOTE — Assessment & Plan Note (Signed)
 Aggravated by family conf;it over meother's estate.  Using 1/2  lorazepam   at night, and occasionally during the day. The risks and benefits of benzodiazepine use were discussed with patient today including excessive sedation leading to respiratory depression,  impaired thinking/driving, and addiction.  Patient was advised to avoid concurrent use with alcohol, to use medication only as needed and not to share with others  .

## 2024-03-27 NOTE — Assessment & Plan Note (Signed)
 Continue  use Prolia ., next dose due in December 2025  Discussed the current controversies surrounding the risks and benefits of calcium  supplementation.  Encouraged her to increase dietary calcium  through natural foods including almond/coconut milk.Vit d supplementation has been suspended due to level > 100 on low dose supplement oct 2023.

## 2024-03-27 NOTE — Assessment & Plan Note (Addendum)
 Mild, intermittent .  Lab Results  Component Value Date   WBC 8.7 03/23/2024   HGB 15.2 (H) 03/23/2024   HCT 44.8 03/23/2024   MCV 93.2 03/23/2024   PLT 333.0 03/23/2024

## 2024-03-27 NOTE — Assessment & Plan Note (Signed)
 With ongoing weight loss  I have reviewed her diet and recommended that she increase her protein and fat intake while monitoring her carbohydrates.

## 2024-03-27 NOTE — Assessment & Plan Note (Signed)
 She had hyperplastic polyps on Nov 2012 colonoscopy and is up to date with last colonoscopy in 2022

## 2024-03-27 NOTE — Assessment & Plan Note (Signed)
 She remains asymptomatic with activities

## 2024-03-27 NOTE — Assessment & Plan Note (Signed)
 She is asymptomatic  .  Continue asa 81 mg daily and  statin therapy for primary prevention LDL is at goal .  She has been encouraged to quit smoking   Lab Results  Component Value Date   CHOL 171 03/23/2024   HDL 91.00 03/23/2024   LDLCALC 65 03/23/2024   LDLDIRECT 66.0 03/23/2024   TRIG 75.0 03/23/2024   CHOLHDL 2 03/23/2024

## 2024-03-27 NOTE — Assessment & Plan Note (Signed)
 She is aware of the risks and continues to smoke  1/2 pack daily.  Reviewed chest CT from November  2024;  referring to [place] pulmonology, for continued annual screening

## 2024-03-27 NOTE — Progress Notes (Addendum)
 Patient ID: Kimberly Schmitt, female    DOB: 1951/02/10  Age: 73 y.o. MRN: [id number]  The patient is here for follow up and management of other chronic and acute problems.   The risk factors are reflected in the social history.   The roster of all physicians providing medical care to patient - is listed in the Snapshot section of the chart.   Activities of daily living:  The patient is 100% independent in all ADLs: dressing, toileting, feeding as well as independent mobility   Home safety : The patient has smoke detectors in the home. They wear seatbelts.  There are no unsecured firearms at home. There is no violence in the home.    There is no risks for hepatitis, STDs or HIV. There is no   history of blood transfusion. They have no travel history to infectious disease endemic areas of the world.   The patient has seen their dentist in the last six month. They have seen their eye doctor in the last year. The patinet  denies slight hearing difficulty with regard to whispered voices and some television programs.  They have deferred audiologic testing in the last year.  They do not  have excessive sun exposure. Discussed the need for sun protection: hats, long sleeves and use of sunscreen if there is significant sun exposure.    Diet: the importance of a healthy diet is discussed. They do have a healthy diet but she has lost 8 lbs in the last year and is underweight    The benefits of regular aerobic exercise were discussed. The patient  is physically active daily.    Depression screen: there are no signs or vegative symptoms of depression- irritability, change in appetite, anhedonia, sadness/tearfullness.   The following portions of the patient's history were reviewed and updated as appropriate: allergies, current medications, past family history, past medical history,  past surgical history, past social history  and problem list.   Visual acuity was not assessed per patient preference since  the patient has regular follow up with an  ophthalmologist. Hearing and body mass index were assessed and reviewed.    During the course of the visit the patient was educated and counseled about appropriate screening and preventive services including : fall prevention , diabetes screening, nutrition counseling, colorectal cancer screening, and recommended immunizations.    Chief Complaint:  1) stress over her family's conflict following her mothers death, management of the estate putting her into conflict with sister Kimberly Schmitt.  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  Still working 6 hrs daily . Feels blessed.,  Thank ful    2) has episodes of feeling weak. Still physically very active in the yard. Feels shaky in the morning,  feels better with 1/2 lorazepam  .  Has lost 11 lbs over the past year. Despite claiming to have a good appetite.  Eats only half of a meal when out.  Breakfast: is a Belvita cookie (only 2 of the 4 cookies per package)   Hiatal hernia causes her to regurgitate  if she eats meat or bread  too fast   3 Tobacco abuse ;  smoked a pack daily after mother's death.  Now 10-12  cigs   3) epistaxis x 2  occurred this summer while cleaning mother's house.     4) received 2 rounds of abx for persistent otitis media  had loose stools  afterward, now resolved    Review of Symptoms  Patient denies headache, fevers, malaise, skin rash, eye pain, sinus congestion and sinus pain, sore throat,  hemoptysis , dyspnea, wheezing, chest pain, palpitations, orthopnea, edema, abdominal pain, nausea, melena, persistent diarrhea, constipation, flank pain, dysuria, hematuria, urinary  Frequency, nocturia, numbness, tingling, seizures,  Focal weakness, Loss of consciousness,  Tremor, insomnia,  and suicidal ideation.    Physical Exam:  BP (!) 180/84   Pulse 88   Ht 4' 11 (1.499 m)   Wt 82 lb 9.6 oz (37.5 kg)   SpO2 97%   BMI 16.68 kg/m     Physical Exam Vitals reviewed.  Constitutional:      General: She is not in acute distress.    Appearance: Normal appearance. She is well-developed, well-groomed and underweight. She is not ill-appearing, toxic-appearing or diaphoretic.  HENT:     Head: Normocephalic.     Right Ear: Tympanic membrane, ear canal and external ear normal. There is no impacted cerumen.     Left Ear: Tympanic membrane, ear canal and external ear normal. There is no impacted cerumen.     Nose: Nose normal.     Mouth/Throat:     Mouth: Mucous membranes are moist.     Pharynx: Oropharynx is clear.  Eyes:     General: No scleral icterus.       Right eye: No discharge.        Left eye: No discharge.     Conjunctiva/sclera: Conjunctivae normal.     Pupils: Pupils are equal, round, and reactive to light.  Neck:     Thyroid : No thyromegaly.     Vascular: No carotid bruit or JVD.  Cardiovascular:     Rate and Rhythm: Normal rate and regular rhythm.     Heart sounds: Normal heart sounds.  Pulmonary:     Effort: Pulmonary effort is normal. No respiratory distress.     Breath sounds: Normal breath sounds.  Chest:  Breasts:    Breasts are symmetrical.     Right: Normal. No swelling, inverted nipple, mass, nipple discharge, skin change or tenderness.     Left: Normal. No swelling, inverted nipple, mass, nipple discharge, skin change or tenderness.  Abdominal:     General: Bowel sounds are normal.     Palpations: Abdomen is soft. There is no mass.     Tenderness: There is no abdominal tenderness. There is no guarding or rebound.  Musculoskeletal:        General: Normal range of motion.     Cervical back: Normal range of motion and neck supple.  Lymphadenopathy:     Cervical: No cervical adenopathy.     Upper Body:     Right upper body: No supraclavicular, axillary or pectoral adenopathy.     Left upper body: No supraclavicular, axillary or pectoral adenopathy.  Skin:    General: Skin is warm and dry.   Neurological:     General: No focal deficit present.     Mental Status: She is alert and oriented to person, place, and time. Mental status is at baseline.  Psychiatric:        Mood and Affect: Mood normal.        Behavior: Behavior normal.        Thought Content: Thought content normal.        Judgment: Judgment normal.     Assessment and Plan: Esophageal dysphagia Assessment & Plan: Occurring infrequently with solids only.  10 lb weight  loss in the past year . DG esophagus and GI referral made for EGD   Orders: -     DG ESOPHAGUS W SINGLE CM (SOL OR THIN BA); Future -     Ambulatory referral to Gastroenterology  Screening for lung cancer -     Pulmonary Visit  Atherosclerosis of native coronary artery of native heart without angina pectoris Assessment & Plan: She is asymptomatic  .  Continue asa 81 mg daily and  statin therapy for primary prevention LDL is at goal .  She has been encouraged to quit smoking   Lab Results  Component Value Date   CHOL 171 03/23/2024   HDL 91.00 03/23/2024   LDLCALC 65 03/23/2024   LDLDIRECT 66.0 03/23/2024   TRIG 75.0 03/23/2024   CHOLHDL 2 03/23/2024      Insomnia secondary to anxiety Assessment & Plan: Aggravated by family conf;it over meother's estate.  Using 1/2  lorazepam   at night, and occasionally during the day. The risks and benefits of benzodiazepine use were discussed with patient today including excessive sedation leading to respiratory depression,  impaired thinking/driving, and addiction.  Patient was advised to avoid concurrent use with alcohol, to use medication only as needed and not to share with others  .     Need for influenza vaccination -     Flu vaccine HIGH DOSE PF(Fluzone Trivalent)  Emphysema, unspecified (HCC) Assessment & Plan: She remains asymptomatic with activities   Tobacco abuse counseling Assessment & Plan: She is aware of the risks and continues to smoke  1/2 pack daily.  Reviewed chest CT  from November  2024;  referring to [facility], for continued annual screening    Thrombocytosis Assessment & Plan: Mild, intermittent .  Lab Results  Component Value Date   WBC 8.7 03/23/2024   HGB 15.2 (H) 03/23/2024   HCT 44.8 03/23/2024   MCV 93.2 03/23/2024   PLT 333.0 03/23/2024      Screening for colon cancer Assessment & Plan: She had hyperplastic polyps on Nov 2012 colonoscopy and is up to date with last colonoscopy in 2022   Age-related osteoporosis without current pathological fracture Assessment & Plan: Continue  use Prolia ., next dose due in December 2025  Discussed the current controversies surrounding the risks and benefits of calcium  supplementation.  Encouraged her to increase dietary calcium  through natural foods including almond/coconut milk.Vit d supplementation has been suspended due to level > 100 on low dose supplement oct 2023.    Underweight (BMI < 18.5) Assessment & Plan: With ongoing weight loss  I have reviewed her diet and recommended that she increase her protein and fat intake while monitoring her carbohydrates.     Other orders -     Atorvastatin  Calcium ; Take 1 tablet (20 mg total) by mouth daily.  Dispense: 90 tablet; Refill: 3 -     PARoxetine  HCl; Take 1 tablet (20 mg total) by mouth daily. after dinner  Dispense: 90 tablet; Refill: 1    Return in about 4 weeks (around 04/24/2024) for follow up on swallow evaluation .  Verneita LITTIE Kettering, MD

## 2024-03-27 NOTE — Patient Instructions (Addendum)
 You have lost 11 lbs since Oct 2024!!!  This is not good!  Please do not have less than 3 meals,  the premier protein shake can be counted as a meal  Increase the paroxetine  dose  to  20 mg for your anxiety.   I am ordering a DG esophagus to evaluate your swallowing issues.   This is an x ray of your esophagus during different phases of swallowing.  You will be asked to drink a chalky substance and pictures will be taken during your swallowing to see if there is a stricture.  Please call [phone] to set this up at [facility]    I have also made a referral to [facility] because you may need an endoscopy  I have made the referral to [facility] for the lung cancer screening CT

## 2024-03-27 NOTE — Assessment & Plan Note (Signed)
 Occurring infrequently with solids only.  10 lb weight loss in the past year . DG esophagus and GI referral made for EGD

## 2024-03-31 ENCOUNTER — Other Ambulatory Visit (HOSPITAL_COMMUNITY): Payer: Self-pay

## 2024-03-31 ENCOUNTER — Telehealth: Payer: Self-pay

## 2024-03-31 NOTE — Telephone Encounter (Signed)
 Prolia  VOB initiated via [email or web address]  Next Prolia  inj DUE: 04/29/24

## 2024-03-31 NOTE — Telephone Encounter (Signed)
 Kimberly Schmitt

## 2024-03-31 NOTE — Telephone Encounter (Signed)
 Pt ready for scheduling for PROLIA  on or after : 04/29/24  Option# 1: Buy/Bill (Office supplied medication)  Out-of-pocket cost due at time of clinic visit: $0  Number of injection/visits approved: ---  Primary: MEDICARE Prolia  co-insurance: 0% Admin fee co-insurance: 0%  Secondary: BCBS[place]-MED[employer]P Prolia  co-insurance:  Admin fee co-insurance:   Medical Benefit Details: Date Benefits were checked: 03/31/24 Deductible: $257 Met of $257 Required/ Coinsurance: 0%/ Admin Fee: 0%  Prior Auth: N/A PA# Expiration Date:   # of doses approved: ----------------------------------------------------------------------- Option# 2- Med Obtained from pharmacy:  Pharmacy benefit: Copay $810.79 (Paid to pharmacy) Admin Fee: 0% (Pay at clinic)  Prior Auth: N/A PA# Expiration Date:   # of doses approved:   If patient wants fill through the pharmacy benefit please send prescription to: [facility], and include estimated need by date in rx notes. Pharmacy will ship medication directly to the office.  Patient NOT eligible for Prolia  Copay Card. Copay Card can make patient's cost as little as $25. Link to apply: [email or web address]://[email or web address]  ** This summary of benefits is an estimation of the patient's out-of-pocket cost. Exact cost may very based on individual plan coverage.

## 2024-04-10 ENCOUNTER — Telehealth: Payer: Self-pay

## 2024-04-10 NOTE — Telephone Encounter (Signed)
 Copied from CRM #[id number]. Topic: Clinical - Request for Lab/Test Order >> Apr 10, 2024  1:29 PM Lonell PEDLAR wrote: Reason for CRM: Patient saw Dr. Marylynn on 11/17, and was advised that swallowing test would be ordered and scheduled for her. Patient has f/u on 12/15 and has not heard back regarding having test scheduled. Would like to have it complete before f/u apt. Please review and c/b to advise [phone]

## 2024-04-10 NOTE — Telephone Encounter (Signed)
 Spoke with pt and gave her the number to call and schedule the exam.

## 2024-04-12 ENCOUNTER — Ambulatory Visit
Admission: RE | Admit: 2024-04-12 | Discharge: 2024-04-12 | Disposition: A | Discharge status: Home / Self Care | Source: Ambulatory Visit | Attending: Internal Medicine | Admitting: Internal Medicine

## 2024-04-12 ENCOUNTER — Other Ambulatory Visit: Payer: Self-pay | Admitting: Internal Medicine

## 2024-04-12 DIAGNOSIS — R1319 Other dysphagia: Secondary | ICD-10-CM | POA: Insufficient documentation

## 2024-04-12 DIAGNOSIS — D75839 Thrombocytosis, unspecified: Secondary | ICD-10-CM

## 2024-04-12 DIAGNOSIS — Z716 Tobacco abuse counseling: Secondary | ICD-10-CM

## 2024-04-12 DIAGNOSIS — Z681 Body mass index (BMI) 19 or less, adult: Secondary | ICD-10-CM

## 2024-04-12 DIAGNOSIS — M810 Age-related osteoporosis without current pathological fracture: Secondary | ICD-10-CM

## 2024-04-12 DIAGNOSIS — K449 Diaphragmatic hernia without obstruction or gangrene: Secondary | ICD-10-CM | POA: Diagnosis not present

## 2024-04-12 DIAGNOSIS — Z1211 Encounter for screening for malignant neoplasm of colon: Secondary | ICD-10-CM

## 2024-04-12 DIAGNOSIS — Z23 Encounter for immunization: Secondary | ICD-10-CM

## 2024-04-12 DIAGNOSIS — J439 Emphysema, unspecified: Secondary | ICD-10-CM

## 2024-04-12 DIAGNOSIS — R1310 Dysphagia, unspecified: Secondary | ICD-10-CM | POA: Diagnosis not present

## 2024-04-12 DIAGNOSIS — F419 Anxiety disorder, unspecified: Secondary | ICD-10-CM

## 2024-04-12 DIAGNOSIS — Z122 Encounter for screening for malignant neoplasm of respiratory organs: Secondary | ICD-10-CM

## 2024-04-12 DIAGNOSIS — I2510 Atherosclerotic heart disease of native coronary artery without angina pectoris: Secondary | ICD-10-CM

## 2024-04-12 DIAGNOSIS — K224 Dyskinesia of esophagus: Secondary | ICD-10-CM | POA: Diagnosis not present

## 2024-04-13 ENCOUNTER — Ambulatory Visit: Payer: Self-pay | Admitting: Internal Medicine

## 2024-04-19 ENCOUNTER — Other Ambulatory Visit: Payer: Self-pay

## 2024-04-19 DIAGNOSIS — F17210 Nicotine dependence, cigarettes, uncomplicated: Secondary | ICD-10-CM

## 2024-04-19 DIAGNOSIS — Z122 Encounter for screening for malignant neoplasm of respiratory organs: Secondary | ICD-10-CM

## 2024-04-19 DIAGNOSIS — Z87891 Personal history of nicotine dependence: Secondary | ICD-10-CM

## 2024-04-20 ENCOUNTER — Encounter: Payer: Self-pay | Admitting: Acute Care

## 2024-04-24 ENCOUNTER — Encounter: Payer: Self-pay | Admitting: Internal Medicine

## 2024-04-24 ENCOUNTER — Telehealth: Payer: Self-pay

## 2024-04-24 ENCOUNTER — Ambulatory Visit: Admitting: Internal Medicine

## 2024-04-24 VITALS — BP 152/84 | HR 105 | Ht 59.0 in | Wt 85.6 lb

## 2024-04-24 DIAGNOSIS — R5383 Other fatigue: Secondary | ICD-10-CM

## 2024-04-24 DIAGNOSIS — D75839 Thrombocytosis, unspecified: Secondary | ICD-10-CM | POA: Diagnosis not present

## 2024-04-24 DIAGNOSIS — Z681 Body mass index (BMI) 19 or less, adult: Secondary | ICD-10-CM | POA: Diagnosis not present

## 2024-04-24 DIAGNOSIS — E559 Vitamin D deficiency, unspecified: Secondary | ICD-10-CM

## 2024-04-24 DIAGNOSIS — R7301 Impaired fasting glucose: Secondary | ICD-10-CM

## 2024-04-24 DIAGNOSIS — R636 Underweight: Secondary | ICD-10-CM

## 2024-04-24 DIAGNOSIS — K222 Esophageal obstruction: Secondary | ICD-10-CM

## 2024-04-24 DIAGNOSIS — E7800 Pure hypercholesterolemia, unspecified: Secondary | ICD-10-CM

## 2024-04-24 DIAGNOSIS — R1319 Other dysphagia: Secondary | ICD-10-CM

## 2024-04-24 DIAGNOSIS — K224 Dyskinesia of esophagus: Secondary | ICD-10-CM | POA: Diagnosis not present

## 2024-04-24 MED ORDER — LORAZEPAM 1 MG PO TABS: 1.0000 mg | ORAL_TABLET | Freq: Every evening | ORAL | 2 refills | Status: AC | PRN

## 2024-04-24 MED ORDER — OMEPRAZOLE 40 MG PO CPDR: 40.0000 mg | DELAYED_RELEASE_CAPSULE | Freq: Every day | ORAL | 3 refills | Status: AC

## 2024-04-24 NOTE — Progress Notes (Unsigned)
 Subjective:  Patient ID: Kimberly Schmitt, female    DOB: 1950-06-07  Age: 73 y.o. MRN: [id number]  CC: There were no encounter diagnoses.   HPI LEEANDRA ELLERSON presents for  Chief Complaint  Patient presents with   Medical Management of Chronic Issues    Discuss swallow evaluation   73 YR OLD female  with ongoing tobacco abuse presents for follow up on recent swallow evaluation   Outpatient Medications Prior to Visit  Medication Sig Dispense Refill   aspirin  81 MG tablet Take 81 mg by mouth daily.     atorvastatin  (LIPITOR) 20 MG tablet Take 1 tablet (20 mg total) by mouth daily. 90 tablet 3   CALCIUM  MAGNESIUM ZINC PO Take by mouth.     denosumab  (PROLIA ) 60 MG/ML SOSY injection Inject 60 mg into the skin every 6 (six) months.     Docusate Calcium  (STOOL SOFTENER PO) Take by mouth.     LACTOBACILLUS PROBIOTIC PO Take 1 capsule by mouth daily.     loratadine  (CLARITIN ) 10 MG tablet Take 1 tablet (10 mg total) by mouth daily. (Patient taking differently: Take 10 mg by mouth as needed.) 30 tablet 0   LORazepam  (ATIVAN ) 1 MG tablet Take 1 tablet (1 mg total) by mouth at bedtime as needed for sleep. 30 tablet 2   Melatonin 10 MG TABS Take 1 tablet by mouth daily at 6 (six) AM.     PARoxetine  (PAXIL ) 20 MG tablet Take 1 tablet (20 mg total) by mouth daily. after dinner 90 tablet 1   sodium chloride  (OCEAN) 0.65 % SOLN nasal spray Place 2 sprays into both nostrils daily as needed for congestion. 30 mL 11   UNABLE TO FIND CBD Oil 500mg  (no THC) 1 dropper under tongue every morning     Facility-Administered Medications Prior to Visit  Medication Dose Route Frequency Provider Last Rate Last Admin   [START ON 04/29/2024] denosumab  (PROLIA ) injection 60 mg  60 mg Subcutaneous Once Prakash Kimberling L, MD        Review of Systems;  Patient denies headache, fevers, malaise, unintentional weight loss, skin rash, eye pain, sinus congestion and sinus pain, sore throat, dysphagia,  hemoptysis ,  cough, dyspnea, wheezing, chest pain, palpitations, orthopnea, edema, abdominal pain, nausea, melena, diarrhea, constipation, flank pain, dysuria, hematuria, urinary  Frequency, nocturia, numbness, tingling, seizures,  Focal weakness, Loss of consciousness,  Tremor, insomnia, depression, anxiety, and suicidal ideation.      Objective:  BP (!) 152/84   Pulse (!) 105   Ht 4' 11 (1.499 m)   Wt 85 lb 9.6 oz (38.8 kg)   SpO2 96%   BMI 17.29 kg/m   BP Readings from Last 3 Encounters:  04/24/24 (!) 152/84  03/27/24 (!) 180/84  09/22/23 118/78    Wt Readings from Last 3 Encounters:  04/24/24 85 lb 9.6 oz (38.8 kg)  03/27/24 82 lb 9.6 oz (37.5 kg)  02/01/24 85 lb (38.6 kg)    Physical Exam  Lab Results  Component Value Date   HGBA1C 6.1 03/23/2024   HGBA1C 5.9 01/12/2023   HGBA1C 6.3 01/16/2022    Lab Results  Component Value Date   CREATININE 0.74 03/23/2024   CREATININE 0.75 09/15/2023   CREATININE 0.70 01/12/2023    Lab Results  Component Value Date   WBC 8.7 03/23/2024   HGB 15.2 (H) 03/23/2024   HCT 44.8 03/23/2024   PLT 333.0 03/23/2024   GLUCOSE 79 03/23/2024   CHOL 171 03/23/2024  TRIG 75.0 03/23/2024   HDL 91.00 03/23/2024   LDLDIRECT 66.0 03/23/2024   LDLCALC 65 03/23/2024   ALT 15 03/23/2024   AST 21 03/23/2024   NA 140 03/23/2024   K 4.4 03/23/2024   CL 101 03/23/2024   CREATININE 0.74 03/23/2024   BUN 13 03/23/2024   CO2 28 03/23/2024   TSH 1.75 03/23/2024   INR 1.1 05/09/2021   HGBA1C 6.1 03/23/2024    DG ESOPHAGUS W DOUBLE CM (HD) Result Date: 04/12/2024 CLINICAL DATA:  Patient with new onset dysphagia with solid foods and cold liquids. Patient also has a history of hiatal hernia. EXAM: ESOPHAGUS/BARIUM SWALLOW/TABLET STUDY TECHNIQUE: Combined double and single contrast examination was performed using effervescent crystals, high-density barium, and thin liquid barium. This exam was performed by Warren Dais, NP, and was supervised and  interpreted by Dr. Luverne. FLUOROSCOPY: Radiation Exposure Index (as provided by the fluoroscopic device): 15.5 mGy Kerma COMPARISON:  None Available. FINDINGS: Swallowing: Appears normal. No vestibular penetration or aspiration seen. Pharynx: Unremarkable. Esophagus: Normal appearance. Esophageal motility: Mild dysmotility with tertiary contractions. Hiatal Hernia: Small sliding hiatal hernia. Associated Schatzki ring without significant esophageal stricture. Gastroesophageal reflux: None visualized. Ingested 13mm barium tablet: Passed normally. Other: None. IMPRESSION: Mild esophageal dysmotility and small sliding hiatal hernia. Electronically Signed   By: Marcey Luverne M.D.   On: 04/12/2024 10:24    Assessment & Plan:  .There are no diagnoses linked to this encounter.   I spent 34 minutes on the day of this face to face encounter reviewing patient's  most recent visit with cardiology,  nephrology,  and neurology,  prior relevant surgical and non surgical procedures, recent  labs and imaging studies, counseling on weight management,  reviewing the assessment and plan with patient, and post visit ordering and reviewing of  diagnostics and therapeutics with patient  .   Follow-up: No follow-ups on file.   Verneita LITTIE Kettering, MD

## 2024-04-24 NOTE — Patient Instructions (Signed)
 Start taking omeprazole  once daily either AM or PM  on an empty stomach  Chew food thoroughly  and add a sip of water to make a paste/slurry consistency  before swallowing   Referral to [facility] for dilation of Schatzki's ring with endoscopy

## 2024-04-24 NOTE — Telephone Encounter (Signed)
 Patient saw Dr. Verneita Kettering today and Dr. Kettering would like to see patient again in six months.  Patient states she would like to have her labs drawn prior to her next visit with Dr. Kettering.  I scheduled an appointment for patient to have her labs drawn on 10/31/2024, but lab orders will need to be entered.

## 2024-04-25 ENCOUNTER — Ambulatory Visit: Payer: Self-pay | Admitting: Internal Medicine

## 2024-04-25 DIAGNOSIS — K224 Dyskinesia of esophagus: Secondary | ICD-10-CM | POA: Insufficient documentation

## 2024-04-25 LAB — VITAMIN D 25 HYDROXY (VIT D DEFICIENCY, FRACTURES): VITD: 42.55 ng/mL (ref 30.00–100.00)

## 2024-04-25 NOTE — Assessment & Plan Note (Signed)
 Noted on recent DG esophagus.  PPI prescribed,  GI referral made for evaluation with EGD .  Reminded to chw food thoroughly and moisten with water before swallowing

## 2024-04-25 NOTE — Telephone Encounter (Signed)
 Pended labs for your approval for pt to have done prior to her 6 month follow up appt.

## 2024-04-25 NOTE — Assessment & Plan Note (Signed)
 Etiology unclear; noted on DG Esophagus to investigate reports of dysphagia.  GI referral made.  Reminded to eat slowly and chew food thoroughly

## 2024-04-25 NOTE — Addendum Note (Signed)
 Addended by: [name] on: 04/25/2024 12:29 PM   Modules accepted: Orders

## 2024-04-25 NOTE — Addendum Note (Signed)
 Addended by: HARRIETTE RAISIN on: 04/25/2024 08:47 AM   Modules accepted: Orders

## 2024-04-25 NOTE — Assessment & Plan Note (Signed)
 With 3 lb weight gain in the last month, attributed to improvement in anxiety. .  Reviewed  diet and recommended that she increase her protein and fat intake while monitoring her carbohydrates.

## 2024-04-25 NOTE — Assessment & Plan Note (Signed)
 Mild, intermittent . Not present on repeat.   Lab Results  Component Value Date   WBC 8.7 03/23/2024   HGB 15.2 (H) 03/23/2024   HCT 44.8 03/23/2024   MCV 93.2 03/23/2024   PLT 333.0 03/23/2024    No results found for: IRON, TIBC, FERRITIN

## 2024-05-02 ENCOUNTER — Ambulatory Visit (INDEPENDENT_AMBULATORY_CARE_PROVIDER_SITE_OTHER)

## 2024-05-02 DIAGNOSIS — M810 Age-related osteoporosis without current pathological fracture: Secondary | ICD-10-CM

## 2024-05-02 MED ORDER — DENOSUMAB 60 MG/ML ~~LOC~~ SOSY: 60.0000 mg | PREFILLED_SYRINGE | Freq: Once | SUBCUTANEOUS | Status: AC

## 2024-05-02 NOTE — Progress Notes (Signed)
 After obtaining consent, and per orders of Dr. Glendia, injection of Prolia  given SQ right arm by Sebastian Rosina Helling. Patient tolerated injection well.

## 2024-05-24 ENCOUNTER — Ambulatory Visit
Admission: RE | Admit: 2024-05-24 | Discharge: 2024-05-24 | Disposition: A | Discharge status: Home / Self Care | Source: Ambulatory Visit | Attending: Acute Care | Admitting: Acute Care

## 2024-05-24 DIAGNOSIS — Z87891 Personal history of nicotine dependence: Secondary | ICD-10-CM | POA: Insufficient documentation

## 2024-05-24 DIAGNOSIS — F17210 Nicotine dependence, cigarettes, uncomplicated: Secondary | ICD-10-CM | POA: Insufficient documentation

## 2024-05-24 DIAGNOSIS — Z122 Encounter for screening for malignant neoplasm of respiratory organs: Secondary | ICD-10-CM | POA: Diagnosis present

## 2024-06-01 ENCOUNTER — Ambulatory Visit: Admitting: Anesthesiology

## 2024-06-01 ENCOUNTER — Other Ambulatory Visit: Payer: Self-pay

## 2024-06-01 ENCOUNTER — Encounter: Admission: RE | Disposition: A | Payer: Self-pay | Source: Home / Self Care | Attending: Gastroenterology

## 2024-06-01 ENCOUNTER — Encounter: Payer: Self-pay | Admitting: Gastroenterology

## 2024-06-01 ENCOUNTER — Ambulatory Visit
Admission: RE | Admit: 2024-06-01 | Discharge: 2024-06-01 | Disposition: A | Discharge status: Home / Self Care | Attending: Gastroenterology | Admitting: Gastroenterology

## 2024-06-01 DIAGNOSIS — F17200 Nicotine dependence, unspecified, uncomplicated: Secondary | ICD-10-CM | POA: Insufficient documentation

## 2024-06-01 DIAGNOSIS — K222 Esophageal obstruction: Secondary | ICD-10-CM | POA: Insufficient documentation

## 2024-06-01 DIAGNOSIS — R1310 Dysphagia, unspecified: Secondary | ICD-10-CM | POA: Diagnosis present

## 2024-06-01 DIAGNOSIS — I2510 Atherosclerotic heart disease of native coronary artery without angina pectoris: Secondary | ICD-10-CM | POA: Insufficient documentation

## 2024-06-01 DIAGNOSIS — J449 Chronic obstructive pulmonary disease, unspecified: Secondary | ICD-10-CM | POA: Insufficient documentation

## 2024-06-01 DIAGNOSIS — K224 Dyskinesia of esophagus: Secondary | ICD-10-CM | POA: Insufficient documentation

## 2024-06-01 HISTORY — PX: ESOPHAGEAL DILATION: SHX303

## 2024-06-01 HISTORY — PX: BIOPSY OF SKIN SUBCUTANEOUS TISSUE AND/OR MUCOUS MEMBRANE: SHX6741

## 2024-06-01 HISTORY — PX: ESOPHAGOGASTRODUODENOSCOPY: SHX5428

## 2024-06-01 MED ORDER — PROPOFOL 500 MG/50ML IV EMUL
INTRAVENOUS | Status: DC | PRN
  Administered 2024-06-01: 50 ug/kg/min via INTRAVENOUS

## 2024-06-01 MED ORDER — SODIUM CHLORIDE 0.9 % IV SOLN: INTRAVENOUS | Status: DC

## 2024-06-01 MED ORDER — PROPOFOL 10 MG/ML IV BOLUS
INTRAVENOUS | Status: DC | PRN
  Administered 2024-06-01: 30 mg via INTRAVENOUS
  Administered 2024-06-01: 40 mg via INTRAVENOUS

## 2024-06-01 MED ORDER — PROPOFOL 1000 MG/100ML IV EMUL
INTRAVENOUS | Status: AC
  Filled 2024-06-01: qty 100

## 2024-06-01 MED ORDER — LIDOCAINE HCL (PF) 2 % IJ SOLN
INTRAMUSCULAR | Status: AC
  Filled 2024-06-01: qty 5

## 2024-06-01 MED ORDER — LIDOCAINE HCL (CARDIAC) PF 100 MG/5ML IV SOSY
PREFILLED_SYRINGE | INTRAVENOUS | Status: DC | PRN
  Administered 2024-06-01: 40 mg via INTRAVENOUS

## 2024-06-01 NOTE — H&P (Signed)
 "  Pre-Procedure H&P   Patient ID: Kimberly Schmitt is a 74 y.o. female.  Gastroenterology Provider: [name], DO  Referring Provider: Dr. Marylynn PCP: [name], MD  Date: 06/01/2024  HPI Ms. Kimberly Schmitt is a 74 y.o. female who presents today for Esophagogastroduodenoscopy for Dysphagia .  Positive Esophagram with schatski's ring and presbyesophagus  Notes some difficulty swallowing solids and extremely cold liquids.  No issues with pills other liquids or odynophagia.  Denies any reflux.  She does experience regurgitation symptoms when she has the swallowing issues.  Down 8 pounds over the last year.  She continues to use tobacco   Past Medical History:  Diagnosis Date   CAP (community acquired pneumonia) 05/08/2021   Family history of breast cancer    Family history of colon cancer    Family history of pancreatic cancer    Hyperlipidemia    220 per patient   Personal history of tobacco use, presenting hazards to health 06/12/2015   Pneumonia due to COVID-19 virus 05/08/2021   Sepsis (HCC) 05/08/2021   Visit for preventive health examination 04/17/2013   .     Past Surgical History:  Procedure Laterality Date   APPENDECTOMY  1968   COLONOSCOPY     COLONOSCOPY     COLONOSCOPY WITH PROPOFOL  N/A 09/20/2020   Procedure: COLONOSCOPY WITH PROPOFOL ;  Surgeon: Dessa Reyes ORN, MD;  Location: [facility] ENDOSCOPY;  Service: Endoscopy;  Laterality: N/A;    Family History Father- crc Brother- pancreatic cancer No h/o GI disease or malignancy  Review of Systems  Constitutional:  Negative for activity change, appetite change, chills, diaphoresis, fatigue, fever and unexpected weight change.  HENT:  Negative for trouble swallowing and voice change.   Respiratory:  Negative for shortness of breath and wheezing.   Cardiovascular:  Negative for chest pain, palpitations and leg swelling.  Gastrointestinal:  Negative for abdominal distention, abdominal pain, anal  bleeding, blood in stool, constipation, diarrhea, nausea, rectal pain and vomiting.  Musculoskeletal:  Negative for arthralgias and myalgias.  Skin:  Negative for color change and pallor.  Neurological:  Negative for dizziness, syncope and weakness.  Psychiatric/Behavioral:  Negative for confusion.   All other systems reviewed and are negative.    Medications Medications Ordered Prior to Encounter[1]  Pertinent medications related to GI and procedure were reviewed by me with the patient prior to the procedure  Current Medications[2]  sodium chloride          Allergies[3] Allergies were reviewed by me prior to the procedure  Objective   Body mass index is 16.8 kg/m. Vitals:   06/01/24 1221  BP: (!) 182/100  Pulse: 86  Resp: 14  Temp: (!) 97.3 F (36.3 C)  TempSrc: Temporal  SpO2: 99%  Weight: 39 kg  Height: 5' (1.524 m)     Physical Exam Vitals and nursing note reviewed.  Constitutional:      General: She is not in acute distress.    Appearance: She is not ill-appearing, toxic-appearing or diaphoretic.     Comments: Thin appearing  HENT:     Head: Normocephalic and atraumatic.     Nose: Nose normal.     Mouth/Throat:     Mouth: Mucous membranes are moist.     Pharynx: Oropharynx is clear.  Eyes:     General: No scleral icterus.    Extraocular Movements: Extraocular movements intact.  Cardiovascular:     Rate and Rhythm: Normal rate and regular rhythm.  Heart sounds: Normal heart sounds. No murmur heard.    No friction rub. No gallop.  Pulmonary:     Effort: Pulmonary effort is normal. No respiratory distress.     Breath sounds: No wheezing, rhonchi or rales.     Comments: Diminished bilaterally Abdominal:     General: Bowel sounds are normal. There is no distension.     Palpations: Abdomen is soft.     Tenderness: There is no abdominal tenderness. There is no guarding or rebound.  Musculoskeletal:     Cervical back: Neck supple.     Right lower  leg: No edema.     Left lower leg: No edema.  Skin:    General: Skin is warm and dry.     Coloration: Skin is not jaundiced or pale.  Neurological:     General: No focal deficit present.     Mental Status: She is alert and oriented to person, place, and time. Mental status is at baseline.  Psychiatric:        Mood and Affect: Mood normal.        Behavior: Behavior normal.        Thought Content: Thought content normal.        Judgment: Judgment normal.      Assessment:  Ms. Kimberly Schmitt is a 74 y.o. female  who presents today for Esophagogastroduodenoscopy for Dysphagia .  Plan:  Esophagogastroduodenoscopy with possible intervention today  Esophagogastroduodenoscopy with possible biopsy, control of bleeding, polypectomy, and interventions as necessary has been discussed with the patient/patient representative. Informed consent was obtained from the patient/patient representative after explaining the indication, nature, and risks of the procedure including but not limited to death, bleeding, perforation, missed neoplasm/lesions, cardiorespiratory compromise, and reaction to medications. Opportunity for questions was given and appropriate answers were provided. Patient/patient representative has verbalized understanding is amenable to undergoing the procedure.   [name], DO  [facility]  Portions of the record may have been created with voice recognition software. Occasional wrong-word or 'sound-a-like' substitutions may have occurred due to the inherent limitations of voice recognition software.  Read the chart carefully and recognize, using context, where substitutions may have occurred.     [1]  No current facility-administered medications on file prior to encounter.   Current Outpatient Medications on File Prior to Encounter  Medication Sig Dispense Refill   aspirin  81 MG tablet Take 81 mg by mouth daily.     atorvastatin  (LIPITOR) 20 MG  tablet Take 1 tablet (20 mg total) by mouth daily. 90 tablet 3   CALCIUM  MAGNESIUM ZINC PO Take by mouth.     Docusate Calcium  (STOOL SOFTENER PO) Take by mouth.     LACTOBACILLUS PROBIOTIC PO Take 1 capsule by mouth daily.     omeprazole  (PRILOSEC) 40 MG capsule Take 1 capsule (40 mg total) by mouth daily. 30 capsule 3   PARoxetine  (PAXIL ) 20 MG tablet Take 1 tablet (20 mg total) by mouth daily. after dinner 90 tablet 1   denosumab  (PROLIA ) 60 MG/ML SOSY injection Inject 60 mg into the skin every 6 (six) months.     loratadine  (CLARITIN ) 10 MG tablet Take 1 tablet (10 mg total) by mouth daily. (Patient taking differently: Take 10 mg by mouth as needed.) 30 tablet 0   LORazepam  (ATIVAN ) 1 MG tablet Take 1 tablet (1 mg total) by mouth at bedtime as needed for sleep. 30 tablet 2   Melatonin 10 MG TABS Take 1 tablet by  mouth daily at 6 (six) AM.     sodium chloride  (OCEAN) 0.65 % SOLN nasal spray Place 2 sprays into both nostrils daily as needed for congestion. 30 mL 11   UNABLE TO FIND CBD Oil 500mg  (no THC) 1 dropper under tongue every morning    [2]  Current Facility-Administered Medications:    0.9 %  sodium chloride  infusion, , Intravenous, Continuous, Onita Elspeth Sharper, DO [3] No Known Allergies  "

## 2024-06-01 NOTE — Op Note (Signed)
 [facility] Gastroenterology Patient Name: Kimberly Schmitt Procedure Date: 06/01/2024 12:49 PM MRN: [id number] Account #: [id number] Date of Birth: [birth year] Admit Type: Outpatient Age: 74 Room: [facility] ENDO ROOM 1 Gender: Female Note Status: Finalized Instrument Name: Endoscope [id number] Procedure:             Upper GI endoscopy Indications:           Dysphagia Providers:             [name] DO, DO Referring MD:          Verneita Kettering, MD (Referring MD) Medicines:             Monitored Anesthesia Care Complications:         No immediate complications. Estimated blood loss:                         Minimal. Procedure:             Pre-Anesthesia Assessment:                        - Prior to the procedure, a History and Physical was                         performed, and patient medications and allergies were                         reviewed. The patient is competent. The risks and                         benefits of the procedure and the sedation options and                         risks were discussed with the patient. All questions                         were answered and informed consent was obtained.                         Patient identification and proposed procedure were                         verified by the physician, the nurse, the anesthetist                         and the technician in the endoscopy suite. Mental                         Status Examination: alert and oriented. Airway                         Examination: normal oropharyngeal airway and neck                         mobility. Respiratory Examination: clear to                         auscultation. CV Examination: RRR, no murmurs, no S3  or S4. Prophylactic Antibiotics: The patient does not                         require prophylactic antibiotics. Prior                         Anticoagulants: The patient has taken no anticoagulant                         or  antiplatelet agents. ASA Grade Assessment: III - A                         patient with severe systemic disease. After reviewing                         the risks and benefits, the patient was deemed in                         satisfactory condition to undergo the procedure. The                         anesthesia plan was to use monitored anesthesia care                         (MAC). Immediately prior to administration of                         medications, the patient was re-assessed for adequacy                         to receive sedatives. The heart rate, respiratory                         rate, oxygen saturations, blood pressure, adequacy of                         pulmonary ventilation, and response to care were                         monitored throughout the procedure. The physical                         status of the patient was re-assessed after the                         procedure.                        After obtaining informed consent, the endoscope was                         passed under direct vision. Throughout the procedure,                         the patient's blood pressure, pulse, and oxygen                         saturations were monitored continuously. The Endoscope  was introduced through the mouth, and advanced to the                         second part of duodenum. The upper GI endoscopy was                         accomplished without difficulty. The patient tolerated                         the procedure well. Findings:      The duodenal bulb, first portion of the duodenum and second portion of       the duodenum were normal. Estimated blood loss: none.      The entire examined stomach was normal. Estimated blood loss: none.      Esophagogastric landmarks were identified: the gastroesophageal junction       was found at 40 cm from the incisors.      The Z-line was irregular. One tongue of salmon colored mucosa extending       1-2cm  above the z-line. Biopsies were taken with a cold forceps for       histology. Estimated blood loss was minimal.      A widely patent Schatzki ring was found at the gastroesophageal       junction. A TTS dilator was passed through the scope. Dilation with a       15-16.5-18 mm balloon dilator was performed to 15 mm, 16.5 mm and 18 mm.       The dilation site was examined following endoscope reinsertion and       showed mild improvement in luminal narrowing. Estimated blood loss: none.      Abnormal motility was noted in the esophagus. The cricopharyngeus was       normal. There is spasticity of the esophageal body. The distal       esophagus/lower esophageal sphincter is open. Tertiary peristaltic waves       are noted. Estimated blood loss: none.      The exam of the esophagus was otherwise normal. Impression:            - Normal duodenal bulb, first portion of the duodenum                         and second portion of the duodenum.                        - Normal stomach.                        - Esophagogastric landmarks identified.                        - Z-line irregular. Biopsied.                        - Widely patent Schatzki ring. Dilated.                        - Abnormal esophageal motility, consistent with                         presbyesophagus. Recommendation:        - Patient has a contact  number available for                         emergencies. The signs and symptoms of potential                         delayed complications were discussed with the patient.                         Return to normal activities tomorrow. Written                         discharge instructions were provided to the patient.                        - Discharge patient to home.                        - Resume previous diet.                        - Continue present medications.                        - Await pathology results.                        - Repeat upper endoscopy PRN for  retreatment.                        - Return to GI office as previously scheduled.                        - The findings and recommendations were discussed with                         the patient. Procedure Code(s):     --- Professional ---                        [phone], Esophagogastroduodenoscopy, flexible,                         transoral; with transendoscopic balloon dilation of                         esophagus (less than 30 mm diameter)                        [id number], 59, Esophagogastroduodenoscopy, flexible,                         transoral; with biopsy, single or multiple Diagnosis Code(s):     --- Professional ---                        K22.89, Other specified disease of esophagus                        K22.2, Esophageal obstruction                        K22.4, Dyskinesia of esophagus  R13.10, Dysphagia, unspecified CPT copyright 2022 American Medical Association. All rights reserved. The codes documented in this report are preliminary and upon coder review may  be revised to meet current compliance requirements. Attending Participation:      I personally performed the entire procedure. Elspeth Jungling, DO [name] DO, DO 06/01/2024 1:22:13 PM This report has been signed electronically. Number of Addenda: 0 Note Initiated On: 06/01/2024 12:49 PM Estimated Blood Loss:  Estimated blood loss was minimal.      [facility]

## 2024-06-01 NOTE — Anesthesia Preprocedure Evaluation (Signed)
"                                    Anesthesia Evaluation  Patient identified by MRN, date of birth, ID band Patient awake    Reviewed: Allergy & Precautions, NPO status , Patient's Chart, lab work & pertinent test results  Airway Mallampati: II  TM Distance: >3 FB Neck ROM: Full    Dental  (+) Teeth Intact   Pulmonary neg pulmonary ROS, COPD, Current Smoker and Patient abstained from smoking.   Pulmonary exam normal  + decreased breath sounds      Cardiovascular Exercise Tolerance: Good + CAD  negative cardio ROS Normal cardiovascular exam     Neuro/Psych   Anxiety     negative neurological ROS  negative psych ROS   GI/Hepatic negative GI ROS, Neg liver ROS,,,  Endo/Other  negative endocrine ROS    Renal/GU negative Renal ROS  negative genitourinary   Musculoskeletal   Abdominal   Peds negative pediatric ROS (+)  Hematology negative hematology ROS (+)   Anesthesia Other Findings Past Medical History: 05/08/2021: CAP (community acquired pneumonia) No date: Family history of breast cancer No date: Family history of colon cancer No date: Family history of pancreatic cancer No date: Hyperlipidemia     Comment:  220 per patient 06/12/2015: Personal history of tobacco use, presenting hazards to  health 05/08/2021: Pneumonia due to COVID-19 virus 05/08/2021: Sepsis (HCC) 04/17/2013: Visit for preventive health examination     Comment:  .   Past Surgical History: 1968: APPENDECTOMY No date: COLONOSCOPY No date: COLONOSCOPY 09/20/2020: COLONOSCOPY WITH PROPOFOL ; N/A     Comment:  Procedure: COLONOSCOPY WITH PROPOFOL ;  Surgeon: Dessa Reyes ORN, MD;  Location: [facility] ENDOSCOPY;  Service:               Endoscopy;  Laterality: N/A;  BMI    Body Mass Index: 16.80 kg/m      Reproductive/Obstetrics negative OB ROS                              Anesthesia Physical Anesthesia Plan  ASA: 3  Anesthesia Plan:  General   Post-op Pain Management:    Induction: Intravenous  PONV Risk Score and Plan: Propofol  infusion and TIVA  Airway Management Planned: Natural Airway and Nasal Cannula  Additional Equipment:   Intra-op Plan:   Post-operative Plan:   Informed Consent: I have reviewed the patients History and Physical, chart, labs and discussed the procedure including the risks, benefits and alternatives for the proposed anesthesia with the patient or authorized representative who has indicated his/her understanding and acceptance.     Dental Advisory Given  Plan Discussed with: CRNA  Anesthesia Plan Comments:         Anesthesia Quick Evaluation  "

## 2024-06-01 NOTE — Anesthesia Postprocedure Evaluation (Signed)
"   Anesthesia Post Note  Patient: Kimberly Schmitt  Procedure(s) Performed: EGD (ESOPHAGOGASTRODUODENOSCOPY) DILATION, ESOPHAGUS BIOPSY, GI  Patient location during evaluation: PACU Anesthesia Type: General Level of consciousness: awake Pain management: satisfactory to patient Vital Signs Assessment: post-procedure vital signs reviewed and stable Respiratory status: spontaneous breathing Cardiovascular status: stable Anesthetic complications: no   No notable events documented.   Last Vitals:  Vitals:   06/01/24 1331 06/01/24 1341  BP: (!) 147/82 (!) 141/88  Pulse: 92 88  Resp: (!) 23 15  Temp:    SpO2: 100% 98%    Last Pain:  Vitals:   06/01/24 1341  TempSrc:   PainSc: 0-No pain                 VAN STAVEREN,Kalina Morabito      "

## 2024-06-01 NOTE — Interval H&P Note (Signed)
 History and Physical Interval Note: Preprocedure H&P from 06/01/24  was reviewed and there was no interval change after seeing and examining the patient.  Written consent was obtained from the patient after discussion of risks, benefits, and alternatives. Patient has consented to proceed with Esophagogastroduodenoscopy with possible intervention   06/01/2024 12:29 PM  Kimberly Schmitt  has presented today for surgery, with the diagnosis of Esophageal dysphagia (R13.19).  The various methods of treatment have been discussed with the patient and family. After consideration of risks, benefits and other options for treatment, the patient has consented to  Procedures: EGD (ESOPHAGOGASTRODUODENOSCOPY) (N/A) as a surgical intervention.  The patient's history has been reviewed, patient examined, no change in status, stable for surgery.  I have reviewed the patient's chart and labs.  Questions were answered to the patient's satisfaction.     [name]

## 2024-06-01 NOTE — Transfer of Care (Signed)
 Immediate Anesthesia Transfer of Care Note  Patient: Kimberly Schmitt  Procedure(s) Performed: EGD (ESOPHAGOGASTRODUODENOSCOPY) DILATION, ESOPHAGUS  Patient Location: PACU  Anesthesia Type:General  Level of Consciousness: sedated  Airway & Oxygen Therapy: Patient Spontanous Breathing  Post-op Assessment: Report given to RN and Post -op Vital signs reviewed and stable  Post vital signs: Reviewed and stable  Last Vitals:  Vitals Value Taken Time  BP    Temp    Pulse    Resp    SpO2      Last Pain:  Vitals:   06/01/24 1221  TempSrc: Temporal  PainSc: 0-No pain         Complications: No notable events documented.

## 2024-06-02 LAB — SURGICAL PATHOLOGY

## 2024-06-05 ENCOUNTER — Other Ambulatory Visit: Payer: Self-pay

## 2024-06-05 DIAGNOSIS — F17210 Nicotine dependence, cigarettes, uncomplicated: Secondary | ICD-10-CM

## 2024-06-05 DIAGNOSIS — Z122 Encounter for screening for malignant neoplasm of respiratory organs: Secondary | ICD-10-CM

## 2024-06-05 DIAGNOSIS — Z87891 Personal history of nicotine dependence: Secondary | ICD-10-CM

## 2024-10-31 ENCOUNTER — Other Ambulatory Visit

## 2024-11-02 ENCOUNTER — Ambulatory Visit: Admitting: Internal Medicine

## 2025-02-05 ENCOUNTER — Ambulatory Visit
